# Patient Record
Sex: Female | Born: 1940
Health system: Southern US, Community
[De-identification: ages and names within clinical notes are randomized; demographics above are authoritative.]

## PROBLEM LIST (undated history)

## (undated) DIAGNOSIS — L989 Disorder of the skin and subcutaneous tissue, unspecified: Secondary | ICD-10-CM

## (undated) DIAGNOSIS — E079 Disorder of thyroid, unspecified: Secondary | ICD-10-CM

## (undated) DIAGNOSIS — E039 Hypothyroidism, unspecified: Secondary | ICD-10-CM

## (undated) HISTORY — PX: ELBOW SURGERY: SHX618

## (undated) HISTORY — PX: TUBAL LIGATION: SHX77

## (undated) HISTORY — PX: ANAL FISSURE REPAIR: SHX2312

## (undated) HISTORY — DX: Hypothyroidism, unspecified: E03.9

## (undated) HISTORY — PX: THYROID SURGERY: SHX805

## (undated) HISTORY — PX: TONSILLECTOMY: SUR1361

## (undated) HISTORY — PX: EYE SURGERY: SHX253

## (undated) HISTORY — PX: OTHER SURGICAL HISTORY: SHX169

## (undated) HISTORY — PX: FOOT SURGERY: SHX648

---
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
## 1997-09-19 ENCOUNTER — Other Ambulatory Visit: Admission: RE | Admit: 1997-09-19 | Discharge: 1997-09-19 | Payer: Self-pay | Admitting: Internal Medicine

## 1999-09-15 ENCOUNTER — Other Ambulatory Visit: Admission: RE | Admit: 1999-09-15 | Discharge: 1999-09-15 | Payer: Self-pay | Admitting: Internal Medicine

## 2000-09-20 ENCOUNTER — Other Ambulatory Visit: Admission: RE | Admit: 2000-09-20 | Discharge: 2000-09-20 | Payer: Self-pay | Admitting: Internal Medicine

## 2002-12-30 ENCOUNTER — Encounter: Payer: Self-pay | Admitting: Internal Medicine

## 2002-12-30 ENCOUNTER — Encounter: Admission: RE | Admit: 2002-12-30 | Discharge: 2002-12-30 | Payer: Self-pay | Admitting: Internal Medicine

## 2008-07-15 ENCOUNTER — Encounter: Admission: RE | Admit: 2008-07-15 | Discharge: 2008-07-15 | Payer: Self-pay | Admitting: Orthopedic Surgery

## 2008-07-24 ENCOUNTER — Encounter (INDEPENDENT_AMBULATORY_CARE_PROVIDER_SITE_OTHER): Payer: Self-pay | Admitting: *Deleted

## 2009-10-31 ENCOUNTER — Emergency Department (HOSPITAL_COMMUNITY): Admission: EM | Admit: 2009-10-31 | Discharge: 2009-10-31 | Payer: Self-pay | Admitting: Internal Medicine

## 2013-05-10 ENCOUNTER — Encounter: Payer: Self-pay | Admitting: Gastroenterology

## 2013-06-19 ENCOUNTER — Ambulatory Visit (AMBULATORY_SURGERY_CENTER): Payer: Self-pay

## 2013-06-19 VITALS — Ht 63.5 in | Wt 122.0 lb

## 2013-06-19 DIAGNOSIS — Z1211 Encounter for screening for malignant neoplasm of colon: Secondary | ICD-10-CM

## 2013-06-19 MED ORDER — SUPREP BOWEL PREP KIT 17.5-3.13-1.6 GM/177ML PO SOLN: 1.0000 | Freq: Once | ORAL | Status: DC

## 2013-07-03 ENCOUNTER — Ambulatory Visit (AMBULATORY_SURGERY_CENTER): Payer: Commercial Managed Care - HMO | Admitting: Gastroenterology

## 2013-07-03 ENCOUNTER — Encounter: Payer: Self-pay | Admitting: Gastroenterology

## 2013-07-03 VITALS — BP 127/73 | HR 50 | Temp 96.9°F | Resp 16 | Ht 63.5 in | Wt 122.0 lb

## 2013-07-03 DIAGNOSIS — K5730 Diverticulosis of large intestine without perforation or abscess without bleeding: Secondary | ICD-10-CM

## 2013-07-03 DIAGNOSIS — Z1211 Encounter for screening for malignant neoplasm of colon: Secondary | ICD-10-CM

## 2013-07-03 MED ORDER — SODIUM CHLORIDE 0.9 % IV SOLN: 500.0000 mL | INTRAVENOUS | Status: DC

## 2013-07-03 NOTE — Progress Notes (Signed)
Lidocaine-40mg IV prior to Propofol InductionPropofol given over incremental dosages 

## 2013-07-03 NOTE — Patient Instructions (Signed)
YOU HAD AN ENDOSCOPIC PROCEDURE TODAY AT THE [facility]: Refer to the procedure report that was given to you for any specific questions about what was found during the examination.  If the procedure report does not answer your questions, please call your gastroenterologist to clarify.  If you requested that your care partner not be given the details of your procedure findings, then the procedure report has been included in a sealed envelope for you to review at your convenience later.  YOU SHOULD EXPECT: Some feelings of bloating in the abdomen. Passage of more gas than usual.  Walking can help get rid of the air that was put into your GI tract during the procedure and reduce the bloating. If you had a lower endoscopy (such as a colonoscopy or flexible sigmoidoscopy) you may notice spotting of blood in your stool or on the toilet paper. If you underwent a bowel prep for your procedure, then you may not have a normal bowel movement for a few days.  DIET: Your first meal following the procedure should be a light meal and then it is ok to progress to your normal diet.  A half-sandwich or bowl of soup is an example of a good first meal.  Heavy or fried foods are harder to digest and may make you feel nauseous or bloated.  Likewise meals heavy in dairy and vegetables can cause extra gas to form and this can also increase the bloating.  Drink plenty of fluids but you should avoid alcoholic beverages for 24 hours.  ACTIVITY: Your care partner should take you home directly after the procedure.  You should plan to take it easy, moving slowly for the rest of the day.  You can resume normal activity the day after the procedure however you should NOT DRIVE or use heavy machinery for 24 hours (because of the sedation medicines used during the test).    SYMPTOMS TO REPORT IMMEDIATELY: A gastroenterologist can be reached at any hour.  During normal business hours, 8:30 AM to 5:00 PM Monday through Friday,  call [phone].  After hours and on weekends, please call the GI answering service at [phone] who will take a message and have the physician on call contact you.   Following lower endoscopy (colonoscopy or flexible sigmoidoscopy):  Excessive amounts of blood in the stool  Significant tenderness or worsening of abdominal pains  Swelling of the abdomen that is new, acute  Fever of 100F or higher  FOLLOW UP: If any biopsies were taken you will be contacted by phone or by letter within the next 1-3 weeks.  Call your gastroenterologist if you have not heard about the biopsies in 3 weeks.  Our staff will call the home number listed on your records the next business day following your procedure to check on you and address any questions or concerns that you may have at that time regarding the information given to you following your procedure. This is a courtesy call and so if there is no answer at the home number and we have not heard from you through the emergency physician on call, we will assume that you have returned to your regular daily activities without incident.  SIGNATURES/CONFIDENTIALITY: You and/or your care partner have signed paperwork which will be entered into your electronic medical record.  These signatures attest to the fact that that the information above on your After Visit Summary has been reviewed and is understood.  Full responsibility of the confidentiality of this   discharge information lies with you and/or your care-partner.  Diverticulosis, high fiber diet-handouts given  Repeat colonoscopy in 3 months, please call closer to June to make appointment, no schedule at this time for June.

## 2013-07-03 NOTE — Op Note (Signed)
[place]  [address] [place], [id number]   COLONOSCOPY PROCEDURE REPORT  PATIENT: Tammy, [name].  MR#: [id number] BIRTHDATE: 07-27-1940 , 72  yrs. old GENDER: Female ENDOSCOPIST: Inda Castle, MD REFERRED XH:FSFS Virgina Jock, M.D. PROCEDURE DATE:  07/03/2013 PROCEDURE:   Colonoscopy, diagnostic First Screening Colonoscopy - Avg.  risk and is 50 yrs.  old or older - No.  Prior Negative Screening - Now for repeat screening. 10 or more years since last screening  History of Adenoma - Now for follow-up colonoscopy & has been > or = to 3 yrs.  N/A ASA CLASS:   Class II INDICATIONS:Average risk patient for colon cancer. MEDICATIONS: MAC sedation, administered by CRNA and propofol (Diprivan) 500mg  IV  DESCRIPTION OF PROCEDURE:   After the risks benefits and alternatives of the procedure were thoroughly explained, informed consent was obtained.  A digital rectal exam revealed no abnormalities of the rectum.   The LB EL-[id number]  endoscope was introduced through the anus and advanced to the cecum, which was identified by both the appendix and ileocecal valve. No adverse events experienced.   The quality of the prep was Suprep good  The instrument was then slowly withdrawn as the colon was fully examined.      COLON FINDINGS: In the distal splenic flexure there was an area of prominent mucosa that was seen as the scope was pushed to the cecum.  Upon withdrawal of the scope this area could not be clearly identified.  The left colon and distal transverse colon were examined at least a dozen times over 30 minutes.  tthere is mild diverticulosis in the descending colon.   In the distal splenic flexure there was an area of prominent mucosa that was seen as the scope was pushed to the cecum.  Upon withdrawal of the scope this area could not be clearly identified.  The left colon and distal transverse colon were examined at least a dozen times over 30 minutes.   tthere is mild diverticulosis in the descending colon. The colon was otherwise normal.  There was no diverticulosis, inflammation, polyps or cancers unless previously stated. Retroflexed views revealed no abnormalities. The time to cecum=12 minutes 15 seconds.  Withdrawal time=45 minutes 0 seconds.  The scope was withdrawn and the procedure completed. COMPLICATIONS: There were no complications.  ENDOSCOPIC IMPRESSION: 1.   questionable sessile polyp versus prominent mucosa in the area of the spine flexure 2.  diverticulosis  RECOMMENDATIONS: repeat left colonoscopy in approximately 3 months  eSigned:  Inda Castle, MD 07/03/2013 9:57 AMre cc:   PATIENT NAME:  Tammy, [name]. MR#: [id number]

## 2013-07-03 NOTE — Progress Notes (Signed)
Called to room to assist during endoscopic procedure.  Patient ID and intended procedure confirmed with present staff. Received instructions for my participation in the procedure from the performing physician.  

## 2013-07-04 ENCOUNTER — Telehealth: Payer: Self-pay | Admitting: *Deleted

## 2013-07-04 NOTE — Telephone Encounter (Signed)
  Follow up Call-  Call back number 07/03/2013  Post procedure Call Back phone  # [phone]  Permission to leave phone message Yes    [facility]

## 2013-07-23 ENCOUNTER — Encounter: Payer: Self-pay | Admitting: Gastroenterology

## 2013-09-02 ENCOUNTER — Encounter: Payer: Self-pay | Admitting: Gastroenterology

## 2013-10-14 ENCOUNTER — Ambulatory Visit (AMBULATORY_SURGERY_CENTER): Payer: Self-pay | Admitting: *Deleted

## 2013-10-14 VITALS — Ht 63.5 in | Wt 123.2 lb

## 2013-10-14 DIAGNOSIS — Z1211 Encounter for screening for malignant neoplasm of colon: Secondary | ICD-10-CM

## 2013-10-14 MED ORDER — NA SULFATE-K SULFATE-MG SULF 17.5-3.13-1.6 GM/177ML PO SOLN: ORAL | Status: DC

## 2013-10-14 NOTE — Progress Notes (Signed)
Patient denies any allergies to eggs or soy. Patient denies any problems with anesthesia/sedation. Patient denies any oxygen use at home and does not take any diet/weight loss medications. Patient had emmi information from last colonoscopy.

## 2013-11-07 ENCOUNTER — Ambulatory Visit (AMBULATORY_SURGERY_CENTER): Payer: Commercial Managed Care - HMO | Admitting: Gastroenterology

## 2013-11-07 ENCOUNTER — Encounter: Payer: Self-pay | Admitting: Gastroenterology

## 2013-11-07 VITALS — BP 115/57 | HR 49 | Temp 96.6°F | Resp 11 | Ht 63.0 in | Wt 123.0 lb

## 2013-11-07 DIAGNOSIS — D126 Benign neoplasm of colon, unspecified: Secondary | ICD-10-CM

## 2013-11-07 DIAGNOSIS — Z1211 Encounter for screening for malignant neoplasm of colon: Secondary | ICD-10-CM

## 2013-11-07 MED ORDER — SODIUM CHLORIDE 0.9 % IV SOLN: 500.0000 mL | INTRAVENOUS | Status: DC

## 2013-11-07 NOTE — Op Note (Signed)
[place]  [address] [place], [id number]   COLONOSCOPY PROCEDURE REPORT  PATIENT: Tammy [name], Tammy [name].  MR#: [id number] BIRTHDATE: [birth year] , 72  yrs. old GENDER: Female ENDOSCOPIST: Inda Castle, MD REFERRED BY: PROCEDURE DATE:  11/07/2013 PROCEDURE:   Colonoscopy with snare polypectomy and Submucosal injection, any substance First Screening Colonoscopy - Avg.  risk and is 50 yrs.  old or older - No.  Prior Negative Screening - Now for repeat screening. N/A  History of Adenoma - Now for follow-up colonoscopy & has been > or = to 3 yrs.  No.  It has been less than 3 yrs since last colonoscopy.  Other: See Comments  Polyps Removed Today? Yes. ASA CLASS:   Class II INDICATIONS:Patient's personal history of colon polyp, seen on prior colonoscopy 3/15 but could not be reidentified for removal MEDICATIONS: MAC sedation, administered by CRNA and propofol (Diprivan) 500mg  IV  DESCRIPTION OF PROCEDURE:   After the risks benefits and alternatives of the procedure were thoroughly explained, informed consent was obtained.  A digital rectal exam revealed no abnormalities of the rectum.   The LB [id number]  endoscope was introduced through the anus and advanced to the cecum, which was identified by both the appendix and ileocecal valve. No adverse events experienced.   The quality of the prep was Suprep good  The instrument was then slowly withdrawn as the colon was fully examined.      COLON FINDINGS: A sessile polyp measuring 15 mm in size was found in the distal descending colon.  There was slight dimpling and rectal polyp.  A polypectomy was performed using snare cautery.  The resection was complete and the polyp tissue was completely retrieved.  A saline Injection was given to lift the mucosal wall. A tattoo was applied.  A tattoo was applied.  Injection (tattooing) was performed.   Mild diverticulosis was noted in the descending colon.   The colon  was otherwise normal.  There was no diverticulosis, inflammation, polyps or cancers unless previously stated.  Retroflexed views revealed no abnormalities. The time to cecum=14 minutes 63 seconds.  Withdrawal time=32 minutes 46 seconds.  The scope was withdrawn and the procedure completed. COMPLICATIONS: There were no complications.  ENDOSCOPIC IMPRESSION: 1.   Sessile polyp measuring 15 mm in size was found in the distal descending colon; polypectomy was performed using snare cautery; saline was given to lift the mucosal wall. Injection (tattooing) was performed 2.   Mild diverticulosis was noted in the descending colon 3.   The colon was otherwise normal  RECOMMENDATIONS: Await biopsy results   eSigned:  Inda Castle, MD 11/07/2013 9:27 AM   cc:   PATIENT NAME:  Indea, Dearman. MR#: [id number]

## 2013-11-07 NOTE — Progress Notes (Signed)
A/ox3, pleased with MAC, report to RN 

## 2013-11-07 NOTE — Patient Instructions (Signed)
YOU HAD AN ENDOSCOPIC PROCEDURE TODAY AT THE [facility]: Refer to the procedure report that was given to you for any specific questions about what was found during the examination.  If the procedure report does not answer your questions, please call your gastroenterologist to clarify.  If you requested that your care partner not be given the details of your procedure findings, then the procedure report has been included in a sealed envelope for you to review at your convenience later.  YOU SHOULD EXPECT: Some feelings of bloating in the abdomen. Passage of more gas than usual.  Walking can help get rid of the air that was put into your GI tract during the procedure and reduce the bloating. If you had a lower endoscopy (such as a colonoscopy or flexible sigmoidoscopy) you may notice spotting of blood in your stool or on the toilet paper. If you underwent a bowel prep for your procedure, then you may not have a normal bowel movement for a few days.  DIET: Your first meal following the procedure should be a light meal and then it is ok to progress to your normal diet.  A half-sandwich or bowl of soup is an example of a good first meal.  Heavy or fried foods are harder to digest and may make you feel nauseous or bloated.  Likewise meals heavy in dairy and vegetables can cause extra gas to form and this can also increase the bloating.  Drink plenty of fluids but you should avoid alcoholic beverages for 24 hours.  ACTIVITY: Your care partner should take you home directly after the procedure.  You should plan to take it easy, moving slowly for the rest of the day.  You can resume normal activity the day after the procedure however you should NOT DRIVE or use heavy machinery for 24 hours (because of the sedation medicines used during the test).    SYMPTOMS TO REPORT IMMEDIATELY: A gastroenterologist can be reached at any hour.  During normal business hours, 8:30 AM to 5:00 PM Monday through Friday,  call [phone].  After hours and on weekends, please call the GI answering service at [phone] who will take a message and have the physician on call contact you.   Following lower endoscopy (colonoscopy or flexible sigmoidoscopy):  Excessive amounts of blood in the stool  Significant tenderness or worsening of abdominal pains  Swelling of the abdomen that is new, acute  Fever of 100F or higher  FOLLOW UP: If any biopsies were taken you will be contacted by phone or by letter within the next 1-3 weeks.  Call your gastroenterologist if you have not heard about the biopsies in 3 weeks.  Our staff will call the home number listed on your records the next business day following your procedure to check on you and address any questions or concerns that you may have at that time regarding the information given to you following your procedure. This is a courtesy call and so if there is no answer at the home number and we have not heard from you through the emergency physician on call, we will assume that you have returned to your regular daily activities without incident.  SIGNATURES/CONFIDENTIALITY: You and/or your care partner have signed paperwork which will be entered into your electronic medical record.  These signatures attest to the fact that that the information above on your After Visit Summary has been reviewed and is understood.  Full responsibility of the confidentiality of this   discharge information lies with you and/or your care-partner.  Please read over handout about polyps, diverticulosis and high fiber diets  Continue your normal medications  Await pathology

## 2013-11-07 NOTE — Progress Notes (Signed)
Called to room to assist during endoscopic procedure.  Patient ID and intended procedure confirmed with present staff. Received instructions for my participation in the procedure from the performing physician.  

## 2013-11-08 ENCOUNTER — Telehealth: Payer: Self-pay | Admitting: *Deleted

## 2013-11-08 NOTE — Telephone Encounter (Signed)
  Follow up Call-  Call back number 11/07/2013 07/03/2013  Post procedure Call Back phone  # [phone]  Permission to leave phone message Yes Yes     Patient questions:  Do you have a fever, pain , or abdominal swelling? No. Pain Score  0 *  Have you tolerated food without any problems? Yes.    Have you been able to return to your normal activities? Yes.    Do you have any questions about your discharge instructions: Diet   No. Medications  No. Follow up visit  No.  Do you have questions or concerns about your Care? No.  Actions: * If pain score is 4 or above: No action needed, pain <4.

## 2013-11-13 ENCOUNTER — Encounter: Payer: Self-pay | Admitting: Gastroenterology

## 2015-01-08 DIAGNOSIS — H5203 Hypermetropia, bilateral: Secondary | ICD-10-CM | POA: Diagnosis not present

## 2015-01-08 DIAGNOSIS — Z0100 Encounter for examination of eyes and vision without abnormal findings: Secondary | ICD-10-CM | POA: Diagnosis not present

## 2015-01-08 DIAGNOSIS — H5210 Myopia, unspecified eye: Secondary | ICD-10-CM | POA: Diagnosis not present

## 2015-03-19 DIAGNOSIS — E871 Hypo-osmolality and hyponatremia: Secondary | ICD-10-CM | POA: Diagnosis not present

## 2015-03-19 DIAGNOSIS — E559 Vitamin D deficiency, unspecified: Secondary | ICD-10-CM | POA: Diagnosis not present

## 2015-03-19 DIAGNOSIS — E039 Hypothyroidism, unspecified: Secondary | ICD-10-CM | POA: Diagnosis not present

## 2015-04-01 DIAGNOSIS — Z1212 Encounter for screening for malignant neoplasm of rectum: Secondary | ICD-10-CM | POA: Diagnosis not present

## 2015-04-02 DIAGNOSIS — R739 Hyperglycemia, unspecified: Secondary | ICD-10-CM | POA: Diagnosis not present

## 2015-04-02 DIAGNOSIS — M810 Age-related osteoporosis without current pathological fracture: Secondary | ICD-10-CM | POA: Diagnosis not present

## 2015-04-02 DIAGNOSIS — E871 Hypo-osmolality and hyponatremia: Secondary | ICD-10-CM | POA: Diagnosis not present

## 2015-04-02 DIAGNOSIS — Z1389 Encounter for screening for other disorder: Secondary | ICD-10-CM | POA: Diagnosis not present

## 2015-04-02 DIAGNOSIS — E039 Hypothyroidism, unspecified: Secondary | ICD-10-CM | POA: Diagnosis not present

## 2015-04-02 DIAGNOSIS — Z6822 Body mass index (BMI) 22.0-22.9, adult: Secondary | ICD-10-CM | POA: Diagnosis not present

## 2015-04-02 DIAGNOSIS — Z0000 Encounter for general adult medical examination without abnormal findings: Secondary | ICD-10-CM | POA: Diagnosis not present

## 2015-05-06 DIAGNOSIS — Z803 Family history of malignant neoplasm of breast: Secondary | ICD-10-CM | POA: Diagnosis not present

## 2015-05-06 DIAGNOSIS — Z1231 Encounter for screening mammogram for malignant neoplasm of breast: Secondary | ICD-10-CM | POA: Diagnosis not present

## 2015-12-01 DIAGNOSIS — M810 Age-related osteoporosis without current pathological fracture: Secondary | ICD-10-CM | POA: Diagnosis not present

## 2015-12-01 DIAGNOSIS — E559 Vitamin D deficiency, unspecified: Secondary | ICD-10-CM | POA: Diagnosis not present

## 2016-03-28 DIAGNOSIS — R739 Hyperglycemia, unspecified: Secondary | ICD-10-CM | POA: Diagnosis not present

## 2016-03-28 DIAGNOSIS — Z0000 Encounter for general adult medical examination without abnormal findings: Secondary | ICD-10-CM | POA: Diagnosis not present

## 2016-03-28 DIAGNOSIS — E871 Hypo-osmolality and hyponatremia: Secondary | ICD-10-CM | POA: Diagnosis not present

## 2016-03-28 DIAGNOSIS — E038 Other specified hypothyroidism: Secondary | ICD-10-CM | POA: Diagnosis not present

## 2016-03-28 DIAGNOSIS — R7309 Other abnormal glucose: Secondary | ICD-10-CM | POA: Diagnosis not present

## 2016-03-28 DIAGNOSIS — E559 Vitamin D deficiency, unspecified: Secondary | ICD-10-CM | POA: Diagnosis not present

## 2016-03-28 DIAGNOSIS — M810 Age-related osteoporosis without current pathological fracture: Secondary | ICD-10-CM | POA: Diagnosis not present

## 2016-03-28 DIAGNOSIS — E039 Hypothyroidism, unspecified: Secondary | ICD-10-CM | POA: Diagnosis not present

## 2016-04-04 DIAGNOSIS — M810 Age-related osteoporosis without current pathological fracture: Secondary | ICD-10-CM | POA: Diagnosis not present

## 2016-04-04 DIAGNOSIS — K5790 Diverticulosis of intestine, part unspecified, without perforation or abscess without bleeding: Secondary | ICD-10-CM | POA: Diagnosis not present

## 2016-04-04 DIAGNOSIS — Z0000 Encounter for general adult medical examination without abnormal findings: Secondary | ICD-10-CM | POA: Diagnosis not present

## 2016-04-04 DIAGNOSIS — E559 Vitamin D deficiency, unspecified: Secondary | ICD-10-CM | POA: Diagnosis not present

## 2016-04-04 DIAGNOSIS — E038 Other specified hypothyroidism: Secondary | ICD-10-CM | POA: Diagnosis not present

## 2016-04-04 DIAGNOSIS — H9190 Unspecified hearing loss, unspecified ear: Secondary | ICD-10-CM | POA: Diagnosis not present

## 2016-04-04 DIAGNOSIS — R7309 Other abnormal glucose: Secondary | ICD-10-CM | POA: Diagnosis not present

## 2016-04-04 DIAGNOSIS — Z91030 Bee allergy status: Secondary | ICD-10-CM | POA: Diagnosis not present

## 2016-04-04 DIAGNOSIS — E871 Hypo-osmolality and hyponatremia: Secondary | ICD-10-CM | POA: Diagnosis not present

## 2016-04-14 DIAGNOSIS — Z1212 Encounter for screening for malignant neoplasm of rectum: Secondary | ICD-10-CM | POA: Diagnosis not present

## 2016-06-20 DIAGNOSIS — Z1231 Encounter for screening mammogram for malignant neoplasm of breast: Secondary | ICD-10-CM | POA: Diagnosis not present

## 2016-08-12 DIAGNOSIS — H2520 Age-related cataract, morgagnian type, unspecified eye: Secondary | ICD-10-CM | POA: Diagnosis not present

## 2016-08-12 DIAGNOSIS — H5202 Hypermetropia, left eye: Secondary | ICD-10-CM | POA: Diagnosis not present

## 2016-08-12 DIAGNOSIS — H25811 Combined forms of age-related cataract, right eye: Secondary | ICD-10-CM | POA: Diagnosis not present

## 2016-08-12 DIAGNOSIS — H52223 Regular astigmatism, bilateral: Secondary | ICD-10-CM | POA: Diagnosis not present

## 2016-09-27 ENCOUNTER — Encounter: Payer: Self-pay | Admitting: Gastroenterology

## 2017-03-30 DIAGNOSIS — E038 Other specified hypothyroidism: Secondary | ICD-10-CM | POA: Diagnosis not present

## 2017-03-30 DIAGNOSIS — E559 Vitamin D deficiency, unspecified: Secondary | ICD-10-CM | POA: Diagnosis not present

## 2017-03-30 DIAGNOSIS — R7309 Other abnormal glucose: Secondary | ICD-10-CM | POA: Diagnosis not present

## 2017-03-30 DIAGNOSIS — R82998 Other abnormal findings in urine: Secondary | ICD-10-CM | POA: Diagnosis not present

## 2017-04-06 DIAGNOSIS — Z0000 Encounter for general adult medical examination without abnormal findings: Secondary | ICD-10-CM | POA: Diagnosis not present

## 2017-04-06 DIAGNOSIS — R7309 Other abnormal glucose: Secondary | ICD-10-CM | POA: Diagnosis not present

## 2017-04-06 DIAGNOSIS — M810 Age-related osteoporosis without current pathological fracture: Secondary | ICD-10-CM | POA: Diagnosis not present

## 2017-04-06 DIAGNOSIS — D229 Melanocytic nevi, unspecified: Secondary | ICD-10-CM | POA: Diagnosis not present

## 2017-04-06 DIAGNOSIS — E038 Other specified hypothyroidism: Secondary | ICD-10-CM | POA: Diagnosis not present

## 2017-04-06 DIAGNOSIS — Z91030 Bee allergy status: Secondary | ICD-10-CM | POA: Diagnosis not present

## 2017-04-06 DIAGNOSIS — E559 Vitamin D deficiency, unspecified: Secondary | ICD-10-CM | POA: Diagnosis not present

## 2017-04-06 DIAGNOSIS — K5790 Diverticulosis of intestine, part unspecified, without perforation or abscess without bleeding: Secondary | ICD-10-CM | POA: Diagnosis not present

## 2017-04-06 DIAGNOSIS — E7849 Other hyperlipidemia: Secondary | ICD-10-CM | POA: Diagnosis not present

## 2017-04-12 DIAGNOSIS — Z1212 Encounter for screening for malignant neoplasm of rectum: Secondary | ICD-10-CM | POA: Diagnosis not present

## 2017-06-06 DIAGNOSIS — H25043 Posterior subcapsular polar age-related cataract, bilateral: Secondary | ICD-10-CM | POA: Diagnosis not present

## 2017-06-06 DIAGNOSIS — H02839 Dermatochalasis of unspecified eye, unspecified eyelid: Secondary | ICD-10-CM | POA: Diagnosis not present

## 2017-06-06 DIAGNOSIS — H2513 Age-related nuclear cataract, bilateral: Secondary | ICD-10-CM | POA: Diagnosis not present

## 2017-06-06 DIAGNOSIS — H2511 Age-related nuclear cataract, right eye: Secondary | ICD-10-CM | POA: Diagnosis not present

## 2017-06-06 DIAGNOSIS — H25013 Cortical age-related cataract, bilateral: Secondary | ICD-10-CM | POA: Diagnosis not present

## 2017-06-15 DIAGNOSIS — D485 Neoplasm of uncertain behavior of skin: Secondary | ICD-10-CM | POA: Diagnosis not present

## 2017-06-15 DIAGNOSIS — L989 Disorder of the skin and subcutaneous tissue, unspecified: Secondary | ICD-10-CM | POA: Diagnosis not present

## 2017-07-04 ENCOUNTER — Other Ambulatory Visit: Payer: Self-pay | Admitting: Surgery

## 2017-07-04 DIAGNOSIS — Q892 Congenital malformations of other endocrine glands: Secondary | ICD-10-CM | POA: Diagnosis not present

## 2017-07-05 DIAGNOSIS — Z1231 Encounter for screening mammogram for malignant neoplasm of breast: Secondary | ICD-10-CM | POA: Diagnosis not present

## 2017-07-07 ENCOUNTER — Other Ambulatory Visit: Payer: Self-pay | Admitting: Surgery

## 2017-07-07 DIAGNOSIS — Q892 Congenital malformations of other endocrine glands: Secondary | ICD-10-CM

## 2017-07-14 DIAGNOSIS — H2512 Age-related nuclear cataract, left eye: Secondary | ICD-10-CM | POA: Diagnosis not present

## 2017-07-14 DIAGNOSIS — H2511 Age-related nuclear cataract, right eye: Secondary | ICD-10-CM | POA: Diagnosis not present

## 2017-07-18 ENCOUNTER — Ambulatory Visit
Admission: RE | Admit: 2017-07-18 | Discharge: 2017-07-18 | Disposition: A | Discharge status: Home / Self Care | Payer: Medicare HMO | Source: Ambulatory Visit | Attending: Surgery | Admitting: Surgery

## 2017-07-18 DIAGNOSIS — Q892 Congenital malformations of other endocrine glands: Secondary | ICD-10-CM

## 2017-07-18 DIAGNOSIS — E041 Nontoxic single thyroid nodule: Secondary | ICD-10-CM | POA: Diagnosis not present

## 2017-07-28 DIAGNOSIS — H2512 Age-related nuclear cataract, left eye: Secondary | ICD-10-CM | POA: Diagnosis not present

## 2017-08-09 DIAGNOSIS — Z6822 Body mass index (BMI) 22.0-22.9, adult: Secondary | ICD-10-CM | POA: Diagnosis not present

## 2017-08-09 DIAGNOSIS — E890 Postprocedural hypothyroidism: Secondary | ICD-10-CM | POA: Diagnosis not present

## 2017-09-08 DIAGNOSIS — Z0100 Encounter for examination of eyes and vision without abnormal findings: Secondary | ICD-10-CM | POA: Diagnosis not present

## 2017-09-14 ENCOUNTER — Other Ambulatory Visit: Payer: Self-pay | Admitting: Surgery

## 2017-10-04 NOTE — Pre-Procedure Instructions (Addendum)
Tammy [name]  10/04/2017      [place], [place] [address] [place] [address] Phone: [phone] Fax: [phone]    Your procedure is scheduled on Wed., October 11, 2017 from 3:00PM-3:45PM  Report to [facility] Admitting Entrance "A" at 1:00PM  Call this number if you have problems the morning of surgery:  [phone]   Remember:  No food after midnight on June 11th  You may drink clear liquids until 3 hours (12:00PM) prior to surgery.  Clear liquids allowed are: Water, Juice (non-citric and without pulp), Carbonated beverages, Clear Tea, Black Coffee only, Plain Jell-O only, Gatorade and Plain Popsicles only   Please complete your PRE-SURGERY ENSURE that was given to by 12:00PM the day of surgery.  Please, if able, drink it in one setting. DO NOT SIP.    Take these medicines the morning of surgery with A SIP OF WATER: Levothyroxine (SYNTHROID, LEVOTHROID). If needed EPINEPHrine (EPIPEN) (Bring with you the day of surgery)  As of today, stop taking all Other Aspirin Products, Vitamins, Fish oils, and Herbal medications. Also stop all NSAIDS i.e. Advil, Ibuprofen, Motrin, Aleve, Anaprox, Naproxen, BC, Goody Powders, and all Supplements.    Do not wear jewelry, make-up or nail polish (fingers).  Do not wear lotions, powders, or perfumes, or deodorant.  Do not shave 48 hours prior to surgery.    Do not bring valuables to the hospital.  [facility] is not responsible for any belongings or valuables.  Contacts, dentures or bridgework may not be worn into surgery.  Leave your suitcase in the car.  After surgery it may be brought to your room.  For patients admitted to the hospital, discharge time will be determined by your treatment team.  Patients discharged the day of surgery will not be allowed to drive home.   Special instructions:   [place]- Preparing For Surgery  Before surgery, you can play  an important role. Because skin is not sterile, your skin needs to be as free of germs as possible. You can reduce the number of germs on your skin by washing with CHG (chlorahexidine gluconate) Soap before surgery.  CHG is an antiseptic cleaner which kills germs and bonds with the skin to continue killing germs even after washing.    Oral Hygiene is also important to reduce your risk of infection.  Remember - BRUSH YOUR TEETH THE MORNING OF SURGERY WITH YOUR REGULAR TOOTHPASTE  Please do not use if you have an allergy to CHG or antibacterial soaps. If your skin becomes reddened/irritated stop using the CHG.  Do not shave (including legs and underarms) for at least 48 hours prior to first CHG shower. It is OK to shave your face.  Please follow these instructions carefully.   1. Shower the NIGHT BEFORE SURGERY and the MORNING OF SURGERY with CHG.   2. If you chose to wash your hair, wash your hair first as usual with your normal shampoo.  3. After you shampoo, rinse your hair and body thoroughly to remove the shampoo.  4. Use CHG as you would any other liquid soap. You can apply CHG directly to the skin and wash gently with a scrungie or a clean washcloth.   5. Apply the CHG Soap to your body ONLY FROM THE NECK DOWN.  Do not use on open wounds or open sores. Avoid contact with your eyes, ears, mouth and genitals (private parts). Wash Face  and genitals (private parts)  with your normal soap.  6. Wash thoroughly, paying special attention to the area where your surgery will be performed.  7. Thoroughly rinse your body with warm water from the neck down.  8. DO NOT shower/wash with your normal soap after using and rinsing off the CHG Soap.  9. Pat yourself dry with a CLEAN TOWEL.  10. Wear CLEAN PAJAMAS to bed the night before surgery, wear comfortable clothes the morning of surgery  11. Place CLEAN SHEETS on your bed the night of your first shower and DO NOT SLEEP WITH PETS.  Day of  Surgery:  Do not apply any deodorants/lotions.  Please wear clean clothes to the hospital/surgery center.   Remember to brush your teeth WITH YOUR REGULAR TOOTHPASTE.  Please read over the following fact sheets that you were given. Pain Booklet, Coughing and Deep Breathing and Surgical Site Infection Prevention

## 2017-10-05 ENCOUNTER — Ambulatory Visit (HOSPITAL_COMMUNITY)
Admission: RE | Admit: 2017-10-05 | Discharge: 2017-10-05 | Disposition: A | Discharge status: Home / Self Care | Payer: Medicare HMO | Source: Ambulatory Visit | Attending: Surgery | Admitting: Surgery

## 2017-10-05 ENCOUNTER — Encounter (HOSPITAL_COMMUNITY)
Admission: RE | Admit: 2017-10-05 | Discharge: 2017-10-05 | Disposition: A | Discharge status: Home / Self Care | Payer: Medicare HMO | Source: Ambulatory Visit | Attending: Surgery | Admitting: Surgery

## 2017-10-05 ENCOUNTER — Other Ambulatory Visit: Payer: Self-pay

## 2017-10-05 ENCOUNTER — Encounter (HOSPITAL_COMMUNITY): Payer: Self-pay

## 2017-10-05 DIAGNOSIS — Z01818 Encounter for other preprocedural examination: Secondary | ICD-10-CM | POA: Insufficient documentation

## 2017-10-05 DIAGNOSIS — Z01812 Encounter for preprocedural laboratory examination: Secondary | ICD-10-CM | POA: Insufficient documentation

## 2017-10-05 HISTORY — DX: Disorder of the skin and subcutaneous tissue, unspecified: L98.9

## 2017-10-05 LAB — BASIC METABOLIC PANEL
ANION GAP: 8 (ref 5–15)
BUN: 13 mg/dL (ref 6–20)
CO2: 25 mmol/L (ref 22–32)
Calcium: 9.7 mg/dL (ref 8.9–10.3)
Chloride: 101 mmol/L (ref 101–111)
Creatinine, Ser: 0.6 mg/dL (ref 0.44–1.00)
Glucose, Bld: 56 mg/dL — ABNORMAL LOW (ref 65–99)
POTASSIUM: 3.9 mmol/L (ref 3.5–5.1)
SODIUM: 134 mmol/L — AB (ref 135–145)

## 2017-10-05 LAB — CBC
HCT: 39.4 % (ref 36.0–46.0)
HEMOGLOBIN: 12.9 g/dL (ref 12.0–15.0)
MCH: 31 pg (ref 26.0–34.0)
MCHC: 32.7 g/dL (ref 30.0–36.0)
MCV: 94.7 fL (ref 78.0–100.0)
Platelets: 322 10*3/uL (ref 150–400)
RBC: 4.16 MIL/uL (ref 3.87–5.11)
RDW: 13.2 % (ref 11.5–15.5)
WBC: 5.3 10*3/uL (ref 4.0–10.5)

## 2017-10-05 NOTE — Progress Notes (Signed)
PCP - Dr. Shon Baton- [facility]  Cardiologist - Denies  Chest x-ray - 10/05/17  EKG - Denies  Stress Test - Denies  ECHO - Denies  Cardiac Cath - Denies  Sleep Study - Denies CPAP - None  LABS- 10/05/17: CBC, BMP  ASA- Denies   Anesthesia- No  Pt denies having chest pain, sob, or fever at this time. All instructions explained to the pt, with a verbal understanding of the material. Pt agrees to go over the instructions while at home for a better understanding. The opportunity to ask questions was provided.

## 2017-10-10 NOTE — H&P (Signed)
Tammy [name]  Location: [facility] Patient #: [id number] DOB: 09/03/1940 Married / Language: English / Race: White Female   History of Present Illness Tammy [name] A. Tammy Linden MD; The patient is a 77 year old female who presents for evaluation of a thyroid disorder. This is a pleasant patient referred by Dr. Harriett Sine for evaluation of an abnormal skin lesion on her neck. She was sent to dermatology for this abnormal lesion which she said has been present for more than 10 years. She underwent a shave biopsy and the final pathology showed abnormal thyroid cells. She has had a previous left thyroid lobectomy by her report around 25 years ago. This was for atypical cells seen on a fine-needle aspiration. She still has the right lobe of thyroid. She has had no problems. She thought that the original skin lesion was a stitch abscess and picked at it often. It would occasionally bleed. She has no pain and is otherwise without complaints. She denies difficulty swallowing. She has had no weight loss or weight gain.   Past Surgical History Tammy [name], CMA;  Anal Fissure Repair  Breast Biopsy  Left. Foot Surgery  Left. Thyroid Surgery  Tonsillectomy   Diagnostic Studies History Tammy [name], CMA;  Colonoscopy  1-5 years ago Mammogram  1-3 years ago Pap Smear  1-5 years ago  Allergies Tammy [name], CMA; No Known Drug Allergies [07/04/2017]:  Medication History Tammy [name], CMA;  Synthroid (125MCG Tablet, Oral) Active. EpiPen 2-Pak (0.3MG /0.3ML Soln Auto-inj, Injection) Active. Medications Reconciled  Pregnancy / Birth History Tammy [name], CMA;  Age at menarche  28 years. Age of menopause  [age] Contraceptive History  Oral contraceptives. Gravida  1 Length (months) of breastfeeding  3-6 Maternal age  67-25 Para  14  Other Problems Tammy [name], CMA;  Thyroid Disease     Review of Systems Tammy [name] CMA;  General Not  Present- Appetite Loss, Chills, Fatigue, Fever, Night Sweats, Weight Gain and Weight Loss. Skin Present- Change in Wart/Mole. Not Present- Dryness, Hives, Jaundice, New Lesions, Non-Healing Wounds, Rash and Ulcer. HEENT Present- Wears glasses/contact lenses. Not Present- Earache, Hearing Loss, Hoarseness, Nose Bleed, Oral Ulcers, Ringing in the Ears, Seasonal Allergies, Sinus Pain, Sore Throat, Visual Disturbances and Yellow Eyes. Respiratory Not Present- Bloody sputum, Chronic Cough, Difficulty Breathing, Snoring and Wheezing. Breast Not Present- Breast Mass, Breast Pain, Nipple Discharge and Skin Changes. Cardiovascular Not Present- Chest Pain, Difficulty Breathing Lying Down, Leg Cramps, Palpitations, Rapid Heart Rate, Shortness of Breath and Swelling of Extremities. Gastrointestinal Not Present- Abdominal Pain, Bloating, Bloody Stool, Change in Bowel Habits, Chronic diarrhea, Constipation, Difficulty Swallowing, Excessive gas, Gets full quickly at meals, Hemorrhoids, Indigestion, Nausea, Rectal Pain and Vomiting. Female Genitourinary Not Present- Frequency, Nocturia, Painful Urination, Pelvic Pain and Urgency. Neurological Not Present- Decreased Memory, Fainting, Headaches, Numbness, Seizures, Tingling, Tremor, Trouble walking and Weakness. Psychiatric Not Present- Anxiety, Bipolar, Change in Sleep Pattern, Depression, Fearful and Frequent crying. Endocrine Not Present- Cold Intolerance, Excessive Hunger, Hair Changes, Heat Intolerance, Hot flashes and New Diabetes. Hematology Present- Gland problems. Not Present- Blood Thinners, Easy Bruising, Excessive bleeding, HIV and Persistent Infections.  Vitals   Weight: 133.2 lb Height: 63.5in Body Surface Area: 1.64 m Body Mass Index: 23.22 kg/m  Temp.: 97.38F(Oral)  BP: 110/78 (Sitting, Left Arm, Standard)     Physical Exam (Tammy [name] A. Tammy Linden MD;  The physical exam findings are as follows: Note:On examination of her neck, just  above her old scar is a small round raised skin lesion  which is less than a centimeter in size. It is mobile. There is no enlargement of the right lobe of thyroid and I feel no adenopathy in her neck. Lungs clear CV RRR Abdomen soft, NT/ND    Assessment & Plan    THYROID, ECTOPIC (Q89.2)  Ultrasound confirmed that her left thyroid lobe is still present.  Given that the skin lesion is getting larger and occasionally bleeds and is consistent with thyroid tissue, excision is recommend. I discussed the risks which include, but are not limited to bleeding, infection, injury to surrounding structures, recurrence, etc.  She agrees to proceed

## 2017-10-11 ENCOUNTER — Ambulatory Visit (HOSPITAL_COMMUNITY): Payer: Medicare HMO | Admitting: Anesthesiology

## 2017-10-11 ENCOUNTER — Encounter (HOSPITAL_COMMUNITY)
Admission: RE | Disposition: A | Discharge status: Home / Self Care | Payer: Self-pay | Source: Ambulatory Visit | Attending: Surgery

## 2017-10-11 ENCOUNTER — Ambulatory Visit (HOSPITAL_COMMUNITY)
Admission: RE | Admit: 2017-10-11 | Discharge: 2017-10-11 | Disposition: A | Discharge status: Home / Self Care | Payer: Medicare HMO | Source: Ambulatory Visit | Attending: Surgery | Admitting: Surgery

## 2017-10-11 ENCOUNTER — Encounter (HOSPITAL_COMMUNITY): Payer: Self-pay

## 2017-10-11 DIAGNOSIS — C792 Secondary malignant neoplasm of skin: Secondary | ICD-10-CM | POA: Insufficient documentation

## 2017-10-11 DIAGNOSIS — C73 Malignant neoplasm of thyroid gland: Secondary | ICD-10-CM | POA: Insufficient documentation

## 2017-10-11 DIAGNOSIS — Q892 Congenital malformations of other endocrine glands: Secondary | ICD-10-CM | POA: Insufficient documentation

## 2017-10-11 DIAGNOSIS — E039 Hypothyroidism, unspecified: Secondary | ICD-10-CM | POA: Diagnosis not present

## 2017-10-11 DIAGNOSIS — E038 Other specified hypothyroidism: Secondary | ICD-10-CM | POA: Diagnosis not present

## 2017-10-11 DIAGNOSIS — C4449 Other specified malignant neoplasm of skin of scalp and neck: Secondary | ICD-10-CM | POA: Diagnosis not present

## 2017-10-11 DIAGNOSIS — Z87891 Personal history of nicotine dependence: Secondary | ICD-10-CM | POA: Diagnosis not present

## 2017-10-11 DIAGNOSIS — L989 Disorder of the skin and subcutaneous tissue, unspecified: Secondary | ICD-10-CM | POA: Diagnosis not present

## 2017-10-11 DIAGNOSIS — Z79899 Other long term (current) drug therapy: Secondary | ICD-10-CM | POA: Insufficient documentation

## 2017-10-11 HISTORY — PX: LESION REMOVAL: SHX5196

## 2017-10-11 SURGERY — WIDE EXCISION, LESION, UPPER EXTREMITY
Anesthesia: Monitor Anesthesia Care

## 2017-10-11 MED ORDER — GABAPENTIN 300 MG PO CAPS
300.0000 mg | ORAL_CAPSULE | ORAL | Status: AC
  Administered 2017-10-11: 300 mg via ORAL
  Filled 2017-10-11: qty 1

## 2017-10-11 MED ORDER — MIDAZOLAM HCL 2 MG/2ML IJ SOLN
INTRAMUSCULAR | Status: AC
Start: 2017-10-11 — End: ?
  Filled 2017-10-11: qty 2

## 2017-10-11 MED ORDER — FENTANYL CITRATE (PF) 250 MCG/5ML IJ SOLN
INTRAMUSCULAR | Status: DC | PRN
  Administered 2017-10-11: 50 ug via INTRAVENOUS

## 2017-10-11 MED ORDER — ACETAMINOPHEN 500 MG PO TABS
1000.0000 mg | ORAL_TABLET | ORAL | Status: AC
  Administered 2017-10-11: 1000 mg via ORAL
  Filled 2017-10-11: qty 2

## 2017-10-11 MED ORDER — LIDOCAINE HCL (PF) 1 % IJ SOLN
INTRAMUSCULAR | Status: AC
  Filled 2017-10-11: qty 30

## 2017-10-11 MED ORDER — TRAMADOL HCL 50 MG PO TABS: 50.0000 mg | ORAL_TABLET | Freq: Four times a day (QID) | ORAL | 0 refills | Status: DC | PRN

## 2017-10-11 MED ORDER — BUPIVACAINE-EPINEPHRINE 0.25% -1:200000 IJ SOLN
INTRAMUSCULAR | Status: DC | PRN
  Administered 2017-10-11: 4 mL

## 2017-10-11 MED ORDER — CHLORHEXIDINE GLUCONATE CLOTH 2 % EX PADS: 6.0000 | MEDICATED_PAD | Freq: Once | CUTANEOUS | Status: DC

## 2017-10-11 MED ORDER — PROMETHAZINE HCL 25 MG/ML IJ SOLN: 6.2500 mg | INTRAMUSCULAR | Status: DC | PRN

## 2017-10-11 MED ORDER — PROPOFOL 500 MG/50ML IV EMUL
INTRAVENOUS | Status: DC | PRN
  Administered 2017-10-11: 75 ug/kg/min via INTRAVENOUS

## 2017-10-11 MED ORDER — PROPOFOL 1000 MG/100ML IV EMUL
INTRAVENOUS | Status: AC
  Filled 2017-10-11: qty 100

## 2017-10-11 MED ORDER — MEPERIDINE HCL 50 MG/ML IJ SOLN: 6.2500 mg | INTRAMUSCULAR | Status: DC | PRN

## 2017-10-11 MED ORDER — PROPOFOL 10 MG/ML IV BOLUS
INTRAVENOUS | Status: AC
  Filled 2017-10-11: qty 20

## 2017-10-11 MED ORDER — MIDAZOLAM HCL 2 MG/2ML IJ SOLN: 0.5000 mg | Freq: Once | INTRAMUSCULAR | Status: DC | PRN

## 2017-10-11 MED ORDER — LACTATED RINGERS IV SOLN
INTRAVENOUS | Status: DC
  Administered 2017-10-11 (×2): via INTRAVENOUS

## 2017-10-11 MED ORDER — LIDOCAINE HCL (PF) 1 % IJ SOLN
INTRAMUSCULAR | Status: DC | PRN
  Administered 2017-10-11: 4 mL

## 2017-10-11 MED ORDER — MIDAZOLAM HCL 2 MG/2ML IJ SOLN
INTRAMUSCULAR | Status: DC | PRN
  Administered 2017-10-11: 2 mg via INTRAVENOUS

## 2017-10-11 MED ORDER — CEFAZOLIN SODIUM-DEXTROSE 2-4 GM/100ML-% IV SOLN
2.0000 g | INTRAVENOUS | Status: AC
  Administered 2017-10-11: 2 g via INTRAVENOUS
  Filled 2017-10-11: qty 100

## 2017-10-11 MED ORDER — BUPIVACAINE-EPINEPHRINE (PF) 0.25% -1:200000 IJ SOLN
INTRAMUSCULAR | Status: AC
  Filled 2017-10-11: qty 30

## 2017-10-11 MED ORDER — FENTANYL CITRATE (PF) 250 MCG/5ML IJ SOLN
INTRAMUSCULAR | Status: AC
  Filled 2017-10-11: qty 5

## 2017-10-11 MED ORDER — FENTANYL CITRATE (PF) 100 MCG/2ML IJ SOLN: 25.0000 ug | INTRAMUSCULAR | Status: DC | PRN

## 2017-10-11 SURGICAL SUPPLY — 38 items
ADH SKN CLS APL DERMABOND .7 (GAUZE/BANDAGES/DRESSINGS) ×1
CANISTER SUCT 3000ML PPV (MISCELLANEOUS) IMPLANT
COVER SURGICAL LIGHT HANDLE (MISCELLANEOUS) ×3 IMPLANT
DECANTER SPIKE VIAL GLASS SM (MISCELLANEOUS) ×3 IMPLANT
DERMABOND ADVANCED (GAUZE/BANDAGES/DRESSINGS) ×2
DERMABOND ADVANCED .7 DNX12 (GAUZE/BANDAGES/DRESSINGS) ×1 IMPLANT
DRAPE LAPAROSCOPIC ABDOMINAL (DRAPES) IMPLANT
DRAPE LAPAROTOMY 100X72 PEDS (DRAPES) IMPLANT
DRAPE UTILITY XL STRL (DRAPES) ×6 IMPLANT
ELECT CAUTERY BLADE 6.4 (BLADE) ×3 IMPLANT
ELECT REM PT RETURN 9FT ADLT (ELECTROSURGICAL) ×3
ELECTRODE REM PT RTRN 9FT ADLT (ELECTROSURGICAL) ×1 IMPLANT
GAUZE SPONGE 4X4 12PLY STRL (GAUZE/BANDAGES/DRESSINGS) IMPLANT
GLOVE SURG SIGNA 7.5 PF LTX (GLOVE) ×3 IMPLANT
GOWN STRL REUS W/ TWL LRG LVL3 (GOWN DISPOSABLE) ×1 IMPLANT
GOWN STRL REUS W/ TWL XL LVL3 (GOWN DISPOSABLE) ×1 IMPLANT
GOWN STRL REUS W/TWL LRG LVL3 (GOWN DISPOSABLE) ×3
GOWN STRL REUS W/TWL XL LVL3 (GOWN DISPOSABLE) ×3
KIT BASIN OR (CUSTOM PROCEDURE TRAY) ×3 IMPLANT
KIT TURNOVER KIT B (KITS) ×3 IMPLANT
NDL HYPO 25GX1X1/2 BEV (NEEDLE) ×1 IMPLANT
NEEDLE HYPO 25GX1X1/2 BEV (NEEDLE) ×3 IMPLANT
NS IRRIG 1000ML POUR BTL (IV SOLUTION) ×3 IMPLANT
PACK SURGICAL SETUP 50X90 (CUSTOM PROCEDURE TRAY) ×3 IMPLANT
PAD ARMBOARD 7.5X6 YLW CONV (MISCELLANEOUS) ×6 IMPLANT
PENCIL BUTTON HOLSTER BLD 10FT (ELECTRODE) ×3 IMPLANT
SPECIMEN JAR SMALL (MISCELLANEOUS) ×3 IMPLANT
SPONGE LAP 18X18 X RAY DECT (DISPOSABLE) ×3 IMPLANT
SUT MNCRL AB 4-0 PS2 18 (SUTURE) ×3 IMPLANT
SUT VIC AB 3-0 SH 27 (SUTURE) ×3
SUT VIC AB 3-0 SH 27XBRD (SUTURE) ×1 IMPLANT
SYR BULB 3OZ (MISCELLANEOUS) ×3 IMPLANT
SYR CONTROL 10ML LL (SYRINGE) ×3 IMPLANT
TOWEL OR 17X24 6PK STRL BLUE (TOWEL DISPOSABLE) ×3 IMPLANT
TOWEL OR 17X26 10 PK STRL BLUE (TOWEL DISPOSABLE) ×3 IMPLANT
TUBE CONNECTING 12'X1/4 (SUCTIONS)
TUBE CONNECTING 12X1/4 (SUCTIONS) IMPLANT
YANKAUER SUCT BULB TIP NO VENT (SUCTIONS) IMPLANT

## 2017-10-11 NOTE — Anesthesia Preprocedure Evaluation (Addendum)
Anesthesia Evaluation  Patient identified by MRN, date of birth, ID band Patient awake    Reviewed: Allergy & Precautions, NPO status , Patient's Chart, lab work & pertinent test results  Airway Mallampati: II  TM Distance: >3 FB Neck ROM: Full    Dental  (+) Teeth Intact, Dental Advisory Given   Pulmonary former smoker,    breath sounds clear to auscultation       Cardiovascular  Rhythm:Regular Rate:Normal     Neuro/Psych    GI/Hepatic   Endo/Other  Hypothyroidism   Renal/GU      Musculoskeletal   Abdominal   Peds  Hematology   Anesthesia Other Findings   Reproductive/Obstetrics                            Anesthesia Physical Anesthesia Plan  ASA: II  Anesthesia Plan: MAC   Post-op Pain Management:    Induction: Intravenous  PONV Risk Score and Plan: 3 and Treatment may vary due to age or medical condition  Airway Management Planned: Natural Airway and Simple Face Mask  Additional Equipment:   Intra-op Plan:   Post-operative Plan:   Informed Consent: I have reviewed the patients History and Physical, chart, labs and discussed the procedure including the risks, benefits and alternatives for the proposed anesthesia with the patient or authorized representative who has indicated his/her understanding and acceptance.   Dental advisory given  Plan Discussed with: CRNA and Anesthesiologist  Anesthesia Plan Comments:       Anesthesia Quick Evaluation

## 2017-10-11 NOTE — Interval H&P Note (Signed)
History and Physical Interval Note: no change in H and P  10/11/2017 1:46 PM  Tammy [name]  has presented today for surgery, with the diagnosis of anterior neck skin lesion  The various methods of treatment have been discussed with the patient and family. After consideration of risks, benefits and other options for treatment, the patient has consented to  Procedure(s): [facility] (N/A) as a surgical intervention .  The patient's history has been reviewed, patient examined, no change in status, stable for surgery.  I have reviewed the patient's chart and labs.  Questions were answered to the patient's satisfaction.     [name] A

## 2017-10-11 NOTE — Discharge Instructions (Signed)
Ok to shower starting tomorrow ° °Ice pack, tylenol, ibuprofen also for pain °

## 2017-10-11 NOTE — Op Note (Signed)
EXCISION ANTERIOR NECK SKIN LESION ERAS PATHWAY  Procedure Note  Tammy [name] 10/11/2017   Pre-op Diagnosis: anterior neck skin lesion     Post-op Diagnosis: same  Procedure(s): EXCISION ANTERIOR NECK SKIN LESION (2 cm) ERAS PATHWAY  Surgeon(s): Coralie Keens, MD  Anesthesia: General  Staff:  Circulator: Campbell Riches, RN Scrub Person: Murvin Natal  Estimated Blood Loss: Minimal               Specimens: sent to path  Indications: This is [name] 77 year old female who is status post [name] previous thyroid lobectomy many years ago.  She developed an abnormal skin lesion on her neck several years ago.  Biopsy shows it to be consistent with thyroid tissue.  The decision was made to proceed with excision  Procedure: The patient was brought to the operating room and identified correct patient.  She is placed upon the operating room table and anesthesia was induced.  Her neck was prepped and draped in the usual sterile fashion.  I grasped the skin lesion with an Allis clamp and then excised with an elliptical incision with [name] scalpel.  The specimen was then sent to pathology for evaluation.  I achieved hemostasis with the cautery.  I then closed the subtenons tissue with interrupted 3-0 Vicryl sutures and closed the skin with [name] running 4-0 Monocryl.  Dermabond was then applied.  The patient tolerated procedure well.  All the counts were correct at the end of procedure.  The patient was then taken in [name] stable condition to the recovery room.          Tammy [name]   Date: 10/11/2017  Time: 4:44 PM

## 2017-10-11 NOTE — Transfer of Care (Signed)
Immediate Anesthesia Transfer of Care Note  Patient: Tammy [name]  Procedure(s) Performed: EXCISION ANTERIOR NECK SKIN LESION ERAS PATHWAY (N/A )  Patient Location: PACU  Anesthesia Type:MAC  Level of Consciousness: awake, alert , oriented and patient cooperative  Airway & Oxygen Therapy: Patient Spontanous Breathing and Patient connected to nasal cannula oxygen  Post-op Assessment: Report given to RN, Post -op Vital signs reviewed and stable and Patient moving all extremities  Post vital signs: Reviewed and stable  Last Vitals:  Vitals Value Taken Time  BP    Temp    Pulse 53 10/11/2017  4:49 PM  Resp 13 10/11/2017  4:49 PM  SpO2 100 % 10/11/2017  4:49 PM  Vitals shown include unvalidated device data.  Last Pain:  Vitals:   10/11/17 1256  TempSrc:   PainSc: 0-No pain         Complications: No apparent anesthesia complications

## 2017-10-12 ENCOUNTER — Encounter (HOSPITAL_COMMUNITY): Payer: Self-pay | Admitting: Surgery

## 2017-10-12 NOTE — Anesthesia Postprocedure Evaluation (Signed)
Anesthesia Post Note  Patient: Tammy [name]  Procedure(s) Performed: EXCISION ANTERIOR NECK SKIN LESION ERAS PATHWAY (N/A )     Patient location during evaluation: PACU Anesthesia Type: MAC Level of consciousness: awake and alert Pain management: pain level controlled Vital Signs Assessment: post-procedure vital signs reviewed and stable Respiratory status: spontaneous breathing, nonlabored ventilation and respiratory function stable Cardiovascular status: stable and blood pressure returned to baseline Postop Assessment: no apparent nausea or vomiting Anesthetic complications: no Comments: MAC procedure under 15 minutes in duration. Patient with no complaints of nausea/vomiting.    Last Vitals:  Vitals:   10/11/17 1718 10/11/17 1728  BP: (!) 107/58 122/63  Pulse: (!) 47 (!) 46  Resp: 11 10  Temp: 36.6 C   SpO2: 99% 100%    Last Pain:  Vitals:   10/11/17 1728  TempSrc:   PainSc: 0-No pain                 Catalina Gravel

## 2017-10-12 NOTE — Addendum Note (Signed)
Addendum  created 10/12/17 1403 by Catalina Gravel, MD   Sign clinical note, SmartForm saved

## 2017-10-19 ENCOUNTER — Other Ambulatory Visit (HOSPITAL_COMMUNITY): Payer: Self-pay | Admitting: Endocrinology

## 2017-10-19 DIAGNOSIS — C73 Malignant neoplasm of thyroid gland: Secondary | ICD-10-CM

## 2017-10-20 ENCOUNTER — Other Ambulatory Visit: Payer: Self-pay | Admitting: Surgery

## 2017-10-23 ENCOUNTER — Encounter (HOSPITAL_COMMUNITY)
Admission: RE | Admit: 2017-10-23 | Discharge: 2017-10-23 | Disposition: A | Discharge status: Home / Self Care | Payer: Medicare HMO | Source: Ambulatory Visit | Attending: Endocrinology | Admitting: Endocrinology

## 2017-10-23 DIAGNOSIS — C73 Malignant neoplasm of thyroid gland: Secondary | ICD-10-CM | POA: Insufficient documentation

## 2017-10-23 MED ORDER — THYROTROPIN ALFA 1.1 MG IM SOLR
0.9000 mg | INTRAMUSCULAR | Status: AC
  Administered 2017-10-23: 0.9 mg via INTRAMUSCULAR

## 2017-10-23 MED ORDER — THYROTROPIN ALFA 1.1 MG IM SOLR
INTRAMUSCULAR | Status: AC
  Filled 2017-10-23: qty 0.9

## 2017-10-24 ENCOUNTER — Encounter (HOSPITAL_COMMUNITY)
Admission: RE | Admit: 2017-10-24 | Discharge: 2017-10-24 | Disposition: A | Discharge status: Home / Self Care | Payer: Medicare HMO | Source: Ambulatory Visit | Attending: Endocrinology | Admitting: Endocrinology

## 2017-10-24 DIAGNOSIS — C73 Malignant neoplasm of thyroid gland: Secondary | ICD-10-CM | POA: Diagnosis not present

## 2017-10-24 MED ORDER — THYROTROPIN ALFA 1.1 MG IM SOLR
0.9000 mg | INTRAMUSCULAR | Status: AC
  Administered 2017-10-24: 0.9 mg via INTRAMUSCULAR

## 2017-10-24 MED ORDER — THYROTROPIN ALFA 1.1 MG IM SOLR
INTRAMUSCULAR | Status: AC
  Filled 2017-10-24: qty 0.9

## 2017-10-25 ENCOUNTER — Encounter (HOSPITAL_COMMUNITY)
Admission: RE | Admit: 2017-10-25 | Discharge: 2017-10-25 | Disposition: A | Discharge status: Home / Self Care | Payer: Medicare HMO | Source: Ambulatory Visit | Attending: Endocrinology | Admitting: Endocrinology

## 2017-10-25 MED ORDER — SODIUM IODIDE I 131 CAPSULE: 4.0000 | Freq: Once | INTRAVENOUS | Status: DC | PRN

## 2017-10-27 ENCOUNTER — Encounter (HOSPITAL_COMMUNITY)
Admission: RE | Admit: 2017-10-27 | Discharge: 2017-10-27 | Disposition: A | Discharge status: Home / Self Care | Payer: Medicare HMO | Source: Ambulatory Visit | Attending: Endocrinology | Admitting: Endocrinology

## 2017-10-27 DIAGNOSIS — C73 Malignant neoplasm of thyroid gland: Secondary | ICD-10-CM | POA: Diagnosis not present

## 2017-11-08 ENCOUNTER — Other Ambulatory Visit: Payer: Self-pay | Admitting: Surgery

## 2017-12-07 DIAGNOSIS — C73 Malignant neoplasm of thyroid gland: Secondary | ICD-10-CM | POA: Diagnosis not present

## 2017-12-07 DIAGNOSIS — Z6823 Body mass index (BMI) 23.0-23.9, adult: Secondary | ICD-10-CM | POA: Diagnosis not present

## 2017-12-07 DIAGNOSIS — E890 Postprocedural hypothyroidism: Secondary | ICD-10-CM | POA: Diagnosis not present

## 2017-12-07 DIAGNOSIS — M810 Age-related osteoporosis without current pathological fracture: Secondary | ICD-10-CM | POA: Diagnosis not present

## 2017-12-07 DIAGNOSIS — E559 Vitamin D deficiency, unspecified: Secondary | ICD-10-CM | POA: Diagnosis not present

## 2017-12-07 DIAGNOSIS — E871 Hypo-osmolality and hyponatremia: Secondary | ICD-10-CM | POA: Diagnosis not present

## 2018-03-27 DIAGNOSIS — Z961 Presence of intraocular lens: Secondary | ICD-10-CM | POA: Diagnosis not present

## 2018-03-27 DIAGNOSIS — H18413 Arcus senilis, bilateral: Secondary | ICD-10-CM | POA: Diagnosis not present

## 2018-03-27 DIAGNOSIS — H02839 Dermatochalasis of unspecified eye, unspecified eyelid: Secondary | ICD-10-CM | POA: Diagnosis not present

## 2018-03-27 DIAGNOSIS — H40013 Open angle with borderline findings, low risk, bilateral: Secondary | ICD-10-CM | POA: Diagnosis not present

## 2018-04-09 DIAGNOSIS — E7849 Other hyperlipidemia: Secondary | ICD-10-CM | POA: Diagnosis not present

## 2018-04-09 DIAGNOSIS — R82998 Other abnormal findings in urine: Secondary | ICD-10-CM | POA: Diagnosis not present

## 2018-04-09 DIAGNOSIS — C73 Malignant neoplasm of thyroid gland: Secondary | ICD-10-CM | POA: Diagnosis not present

## 2018-04-09 DIAGNOSIS — E038 Other specified hypothyroidism: Secondary | ICD-10-CM | POA: Diagnosis not present

## 2018-04-09 DIAGNOSIS — R7309 Other abnormal glucose: Secondary | ICD-10-CM | POA: Diagnosis not present

## 2018-04-09 DIAGNOSIS — M810 Age-related osteoporosis without current pathological fracture: Secondary | ICD-10-CM | POA: Diagnosis not present

## 2018-04-16 DIAGNOSIS — E890 Postprocedural hypothyroidism: Secondary | ICD-10-CM | POA: Diagnosis not present

## 2018-04-16 DIAGNOSIS — E871 Hypo-osmolality and hyponatremia: Secondary | ICD-10-CM | POA: Diagnosis not present

## 2018-04-16 DIAGNOSIS — E7849 Other hyperlipidemia: Secondary | ICD-10-CM | POA: Diagnosis not present

## 2018-04-16 DIAGNOSIS — R7309 Other abnormal glucose: Secondary | ICD-10-CM | POA: Diagnosis not present

## 2018-04-16 DIAGNOSIS — Z0000 Encounter for general adult medical examination without abnormal findings: Secondary | ICD-10-CM | POA: Diagnosis not present

## 2018-04-16 DIAGNOSIS — M810 Age-related osteoporosis without current pathological fracture: Secondary | ICD-10-CM | POA: Diagnosis not present

## 2018-04-16 DIAGNOSIS — C73 Malignant neoplasm of thyroid gland: Secondary | ICD-10-CM | POA: Diagnosis not present

## 2018-04-16 DIAGNOSIS — E038 Other specified hypothyroidism: Secondary | ICD-10-CM | POA: Diagnosis not present

## 2018-04-16 DIAGNOSIS — D229 Melanocytic nevi, unspecified: Secondary | ICD-10-CM | POA: Diagnosis not present

## 2018-04-17 DIAGNOSIS — Z1212 Encounter for screening for malignant neoplasm of rectum: Secondary | ICD-10-CM | POA: Diagnosis not present

## 2018-06-11 DIAGNOSIS — E890 Postprocedural hypothyroidism: Secondary | ICD-10-CM | POA: Diagnosis not present

## 2018-06-11 DIAGNOSIS — Z6823 Body mass index (BMI) 23.0-23.9, adult: Secondary | ICD-10-CM | POA: Diagnosis not present

## 2018-06-11 DIAGNOSIS — E871 Hypo-osmolality and hyponatremia: Secondary | ICD-10-CM | POA: Diagnosis not present

## 2018-06-11 DIAGNOSIS — C73 Malignant neoplasm of thyroid gland: Secondary | ICD-10-CM | POA: Diagnosis not present

## 2018-10-29 DIAGNOSIS — E871 Hypo-osmolality and hyponatremia: Secondary | ICD-10-CM | POA: Diagnosis not present

## 2018-10-29 DIAGNOSIS — E785 Hyperlipidemia, unspecified: Secondary | ICD-10-CM | POA: Diagnosis not present

## 2018-10-29 DIAGNOSIS — Z1339 Encounter for screening examination for other mental health and behavioral disorders: Secondary | ICD-10-CM | POA: Diagnosis not present

## 2018-10-29 DIAGNOSIS — E890 Postprocedural hypothyroidism: Secondary | ICD-10-CM | POA: Diagnosis not present

## 2018-10-29 DIAGNOSIS — Z1331 Encounter for screening for depression: Secondary | ICD-10-CM | POA: Diagnosis not present

## 2018-10-29 DIAGNOSIS — E039 Hypothyroidism, unspecified: Secondary | ICD-10-CM | POA: Diagnosis not present

## 2018-10-29 DIAGNOSIS — F418 Other specified anxiety disorders: Secondary | ICD-10-CM | POA: Diagnosis not present

## 2018-10-29 DIAGNOSIS — M810 Age-related osteoporosis without current pathological fracture: Secondary | ICD-10-CM | POA: Diagnosis not present

## 2018-10-29 DIAGNOSIS — C73 Malignant neoplasm of thyroid gland: Secondary | ICD-10-CM | POA: Diagnosis not present

## 2018-11-05 DIAGNOSIS — C73 Malignant neoplasm of thyroid gland: Secondary | ICD-10-CM | POA: Diagnosis not present

## 2018-11-05 DIAGNOSIS — H40013 Open angle with borderline findings, low risk, bilateral: Secondary | ICD-10-CM | POA: Diagnosis not present

## 2018-12-10 DIAGNOSIS — E871 Hypo-osmolality and hyponatremia: Secondary | ICD-10-CM | POA: Diagnosis not present

## 2018-12-10 DIAGNOSIS — M810 Age-related osteoporosis without current pathological fracture: Secondary | ICD-10-CM | POA: Diagnosis not present

## 2018-12-10 DIAGNOSIS — E559 Vitamin D deficiency, unspecified: Secondary | ICD-10-CM | POA: Diagnosis not present

## 2018-12-10 DIAGNOSIS — E890 Postprocedural hypothyroidism: Secondary | ICD-10-CM | POA: Diagnosis not present

## 2018-12-10 DIAGNOSIS — C73 Malignant neoplasm of thyroid gland: Secondary | ICD-10-CM | POA: Diagnosis not present

## 2018-12-10 DIAGNOSIS — E785 Hyperlipidemia, unspecified: Secondary | ICD-10-CM | POA: Diagnosis not present

## 2019-03-21 DIAGNOSIS — Z0100 Encounter for examination of eyes and vision without abnormal findings: Secondary | ICD-10-CM | POA: Diagnosis not present

## 2019-03-21 DIAGNOSIS — H5203 Hypermetropia, bilateral: Secondary | ICD-10-CM | POA: Diagnosis not present

## 2019-04-11 IMAGING — CR DG CHEST 2V
2 series · 2 of 2 positions shown · non-contrast
Comparison: None.

CLINICAL DATA: Preoperative evaluation for thyroidectomy

EXAM:
CHEST - 2 VIEW

[w chest pa]
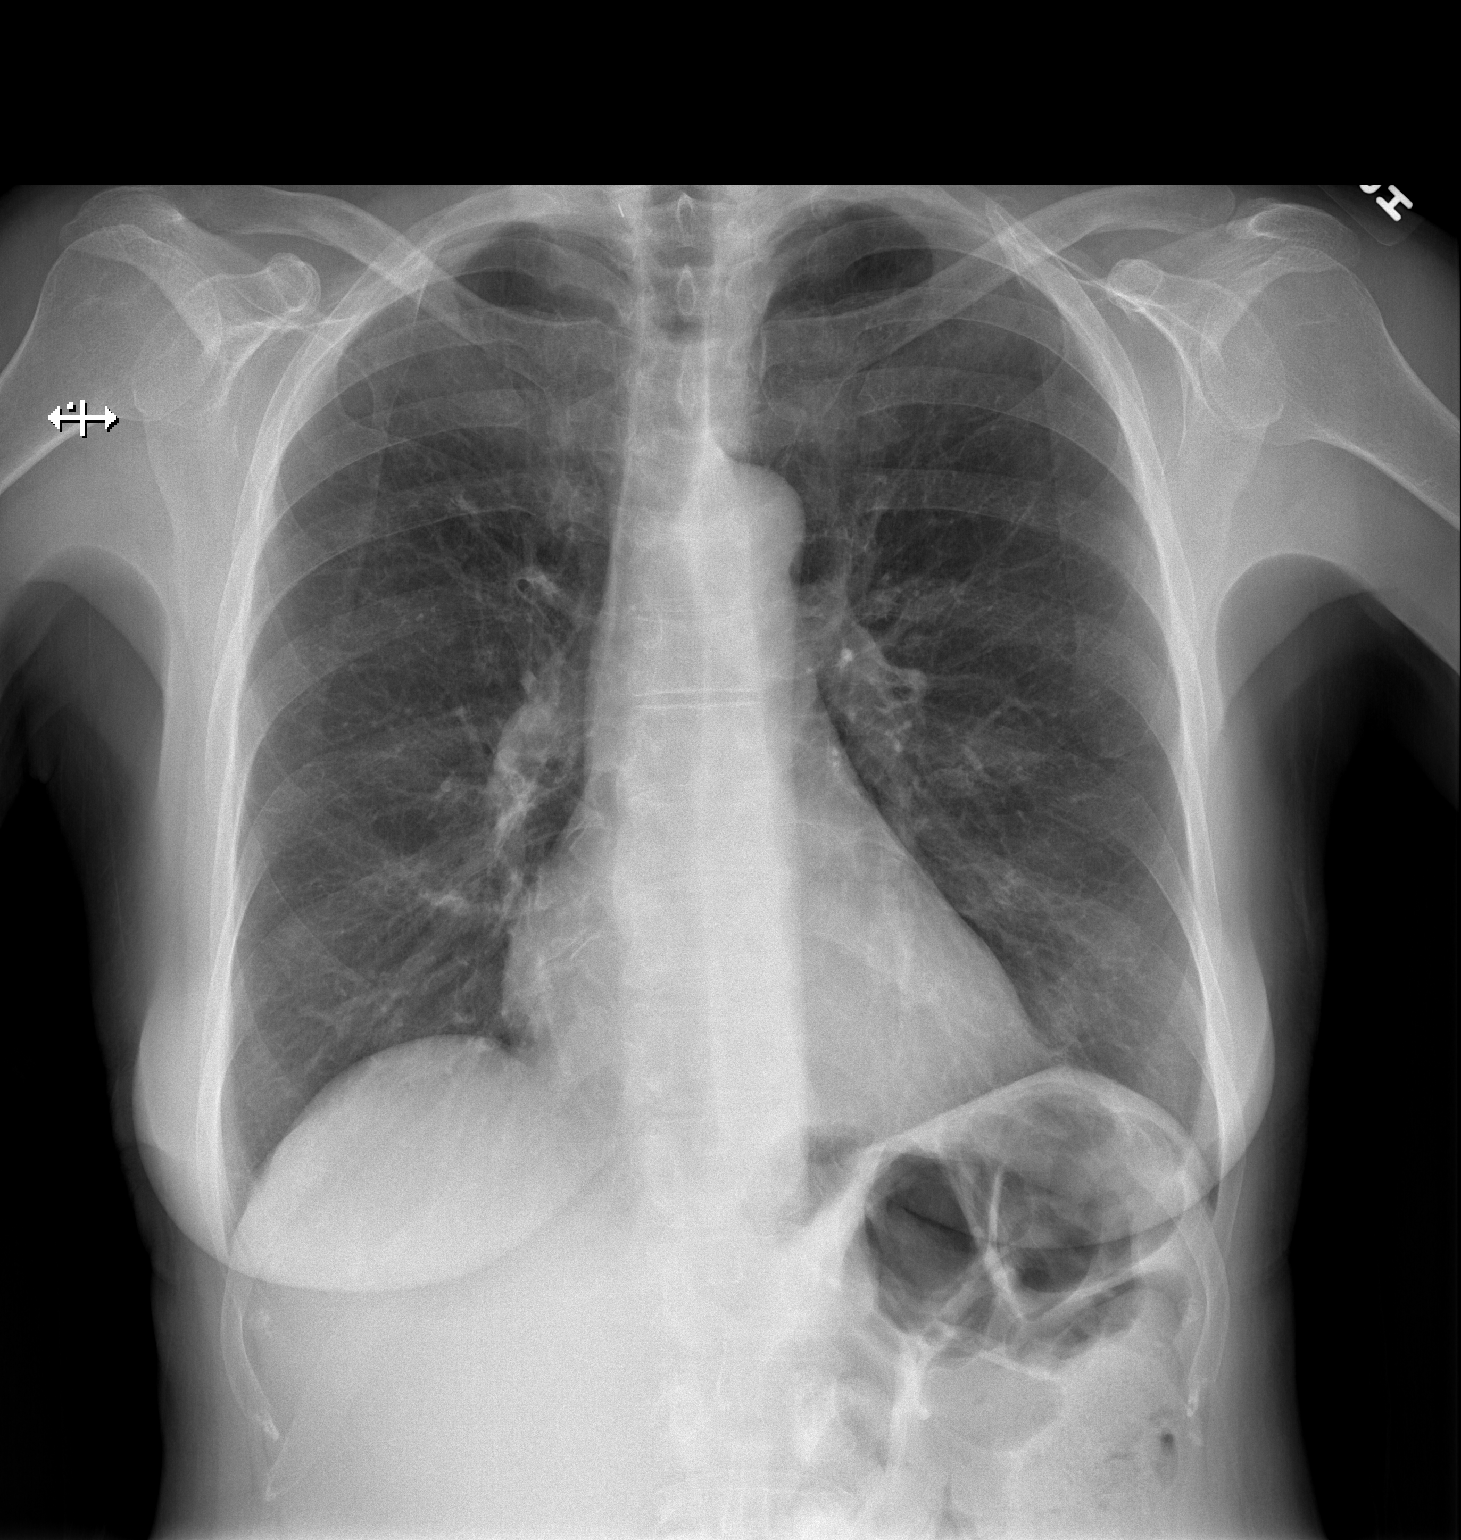

[w chest lat]
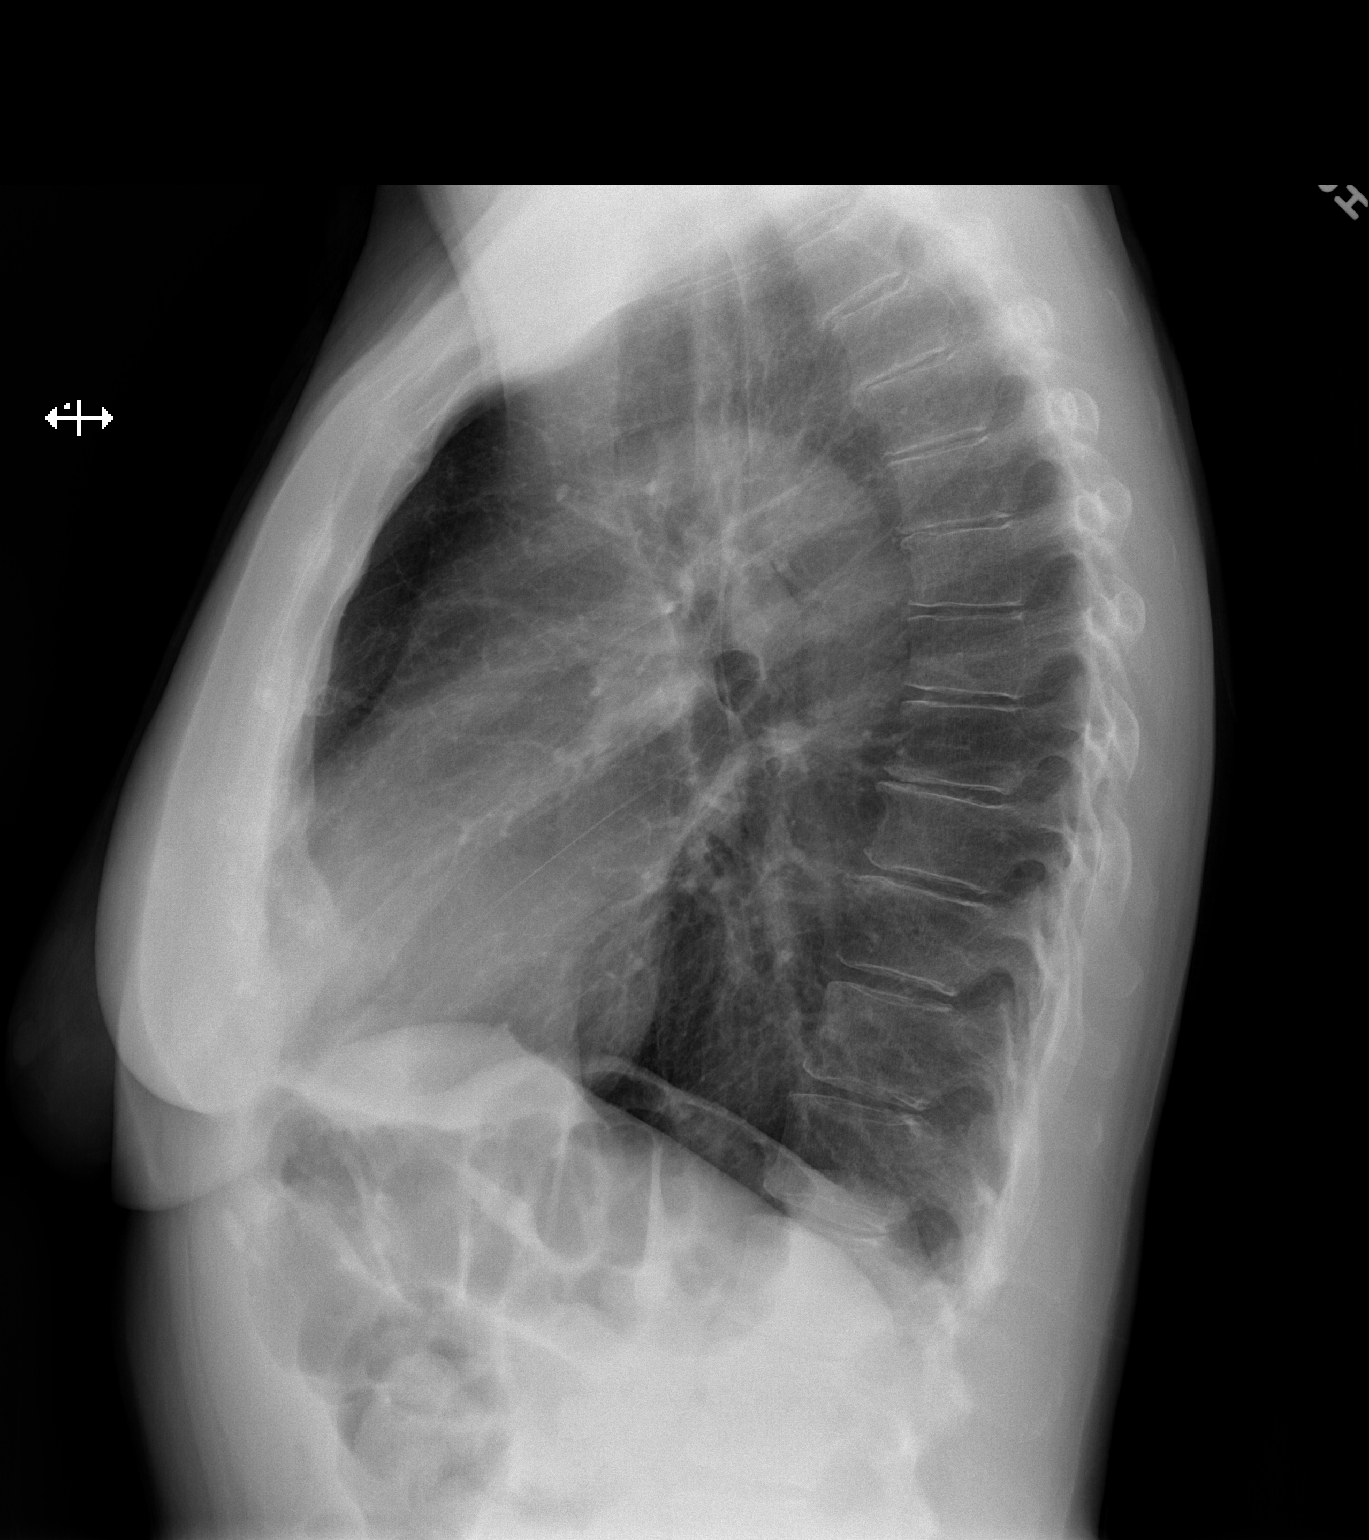

[2 of 2 positions shown; findings below may reference images not displayed]

FINDINGS: Lungs are clear. Heart size and pulmonary vascularity are normal. No
adenopathy. Trachea is in the midline. No bone lesions.
IMPRESSION: No edema or consolidation.  Trachea appears midline.

## 2019-04-22 DIAGNOSIS — E038 Other specified hypothyroidism: Secondary | ICD-10-CM | POA: Diagnosis not present

## 2019-04-22 DIAGNOSIS — E7849 Other hyperlipidemia: Secondary | ICD-10-CM | POA: Diagnosis not present

## 2019-04-22 DIAGNOSIS — M810 Age-related osteoporosis without current pathological fracture: Secondary | ICD-10-CM | POA: Diagnosis not present

## 2019-05-02 DIAGNOSIS — L853 Xerosis cutis: Secondary | ICD-10-CM | POA: Diagnosis not present

## 2019-05-02 DIAGNOSIS — E890 Postprocedural hypothyroidism: Secondary | ICD-10-CM | POA: Diagnosis not present

## 2019-05-02 DIAGNOSIS — E039 Hypothyroidism, unspecified: Secondary | ICD-10-CM | POA: Diagnosis not present

## 2019-05-02 DIAGNOSIS — E781 Pure hyperglyceridemia: Secondary | ICD-10-CM | POA: Diagnosis not present

## 2019-05-02 DIAGNOSIS — Z0000 Encounter for general adult medical examination without abnormal findings: Secondary | ICD-10-CM | POA: Diagnosis not present

## 2019-05-02 DIAGNOSIS — E559 Vitamin D deficiency, unspecified: Secondary | ICD-10-CM | POA: Diagnosis not present

## 2019-05-02 DIAGNOSIS — F418 Other specified anxiety disorders: Secondary | ICD-10-CM | POA: Diagnosis not present

## 2019-05-02 DIAGNOSIS — C73 Malignant neoplasm of thyroid gland: Secondary | ICD-10-CM | POA: Diagnosis not present

## 2019-05-02 DIAGNOSIS — E785 Hyperlipidemia, unspecified: Secondary | ICD-10-CM | POA: Diagnosis not present

## 2019-09-17 DIAGNOSIS — Z20828 Contact with and (suspected) exposure to other viral communicable diseases: Secondary | ICD-10-CM | POA: Diagnosis not present

## 2019-12-13 DIAGNOSIS — R739 Hyperglycemia, unspecified: Secondary | ICD-10-CM | POA: Diagnosis not present

## 2019-12-13 DIAGNOSIS — M810 Age-related osteoporosis without current pathological fracture: Secondary | ICD-10-CM | POA: Diagnosis not present

## 2019-12-13 DIAGNOSIS — E039 Hypothyroidism, unspecified: Secondary | ICD-10-CM | POA: Diagnosis not present

## 2019-12-13 DIAGNOSIS — F418 Other specified anxiety disorders: Secondary | ICD-10-CM | POA: Diagnosis not present

## 2019-12-13 DIAGNOSIS — C73 Malignant neoplasm of thyroid gland: Secondary | ICD-10-CM | POA: Diagnosis not present

## 2019-12-13 DIAGNOSIS — E871 Hypo-osmolality and hyponatremia: Secondary | ICD-10-CM | POA: Diagnosis not present

## 2019-12-13 DIAGNOSIS — E890 Postprocedural hypothyroidism: Secondary | ICD-10-CM | POA: Diagnosis not present

## 2019-12-13 DIAGNOSIS — E785 Hyperlipidemia, unspecified: Secondary | ICD-10-CM | POA: Diagnosis not present

## 2019-12-13 DIAGNOSIS — E559 Vitamin D deficiency, unspecified: Secondary | ICD-10-CM | POA: Diagnosis not present

## 2020-01-01 DIAGNOSIS — E785 Hyperlipidemia, unspecified: Secondary | ICD-10-CM | POA: Diagnosis not present

## 2020-03-31 ENCOUNTER — Observation Stay (HOSPITAL_COMMUNITY)
Admission: EM | Admit: 2020-03-31 | Discharge: 2020-04-01 | Disposition: A | Discharge status: Home / Self Care | Payer: Medicare HMO | Attending: Physician Assistant | Admitting: Physician Assistant

## 2020-03-31 ENCOUNTER — Encounter (HOSPITAL_COMMUNITY): Payer: Self-pay

## 2020-03-31 ENCOUNTER — Emergency Department (HOSPITAL_COMMUNITY): Payer: Medicare HMO

## 2020-03-31 DIAGNOSIS — Z79899 Other long term (current) drug therapy: Secondary | ICD-10-CM | POA: Insufficient documentation

## 2020-03-31 DIAGNOSIS — Q433 Congenital malformations of intestinal fixation: Secondary | ICD-10-CM | POA: Insufficient documentation

## 2020-03-31 DIAGNOSIS — Z043 Encounter for examination and observation following other accident: Secondary | ICD-10-CM | POA: Diagnosis not present

## 2020-03-31 DIAGNOSIS — W19XXXA Unspecified fall, initial encounter: Secondary | ICD-10-CM | POA: Diagnosis not present

## 2020-03-31 DIAGNOSIS — R410 Disorientation, unspecified: Secondary | ICD-10-CM | POA: Insufficient documentation

## 2020-03-31 DIAGNOSIS — I700 Atherosclerosis of aorta: Secondary | ICD-10-CM | POA: Diagnosis not present

## 2020-03-31 DIAGNOSIS — T1490XA Injury, unspecified, initial encounter: Secondary | ICD-10-CM

## 2020-03-31 DIAGNOSIS — S060X1A Concussion with loss of consciousness of 30 minutes or less, initial encounter: Secondary | ICD-10-CM | POA: Diagnosis not present

## 2020-03-31 DIAGNOSIS — S060XAA Concussion with loss of consciousness status unknown, initial encounter: Secondary | ICD-10-CM | POA: Diagnosis present

## 2020-03-31 DIAGNOSIS — R2681 Unsteadiness on feet: Secondary | ICD-10-CM | POA: Diagnosis not present

## 2020-03-31 DIAGNOSIS — R41841 Cognitive communication deficit: Secondary | ICD-10-CM | POA: Insufficient documentation

## 2020-03-31 DIAGNOSIS — S06360A Traumatic hemorrhage of cerebrum, unspecified, without loss of consciousness, initial encounter: Secondary | ICD-10-CM | POA: Diagnosis not present

## 2020-03-31 DIAGNOSIS — R9431 Abnormal electrocardiogram [ECG] [EKG]: Secondary | ICD-10-CM | POA: Diagnosis not present

## 2020-03-31 DIAGNOSIS — S299XXA Unspecified injury of thorax, initial encounter: Secondary | ICD-10-CM | POA: Diagnosis not present

## 2020-03-31 DIAGNOSIS — S060X0A Concussion without loss of consciousness, initial encounter: Secondary | ICD-10-CM | POA: Diagnosis not present

## 2020-03-31 DIAGNOSIS — Z20822 Contact with and (suspected) exposure to covid-19: Secondary | ICD-10-CM | POA: Insufficient documentation

## 2020-03-31 DIAGNOSIS — R110 Nausea: Secondary | ICD-10-CM | POA: Diagnosis not present

## 2020-03-31 DIAGNOSIS — R4182 Altered mental status, unspecified: Secondary | ICD-10-CM | POA: Diagnosis not present

## 2020-03-31 DIAGNOSIS — Y92481 Parking lot as the place of occurrence of the external cause: Secondary | ICD-10-CM | POA: Diagnosis not present

## 2020-03-31 DIAGNOSIS — S0990XA Unspecified injury of head, initial encounter: Secondary | ICD-10-CM | POA: Diagnosis not present

## 2020-03-31 DIAGNOSIS — Z87891 Personal history of nicotine dependence: Secondary | ICD-10-CM | POA: Insufficient documentation

## 2020-03-31 DIAGNOSIS — R2689 Other abnormalities of gait and mobility: Secondary | ICD-10-CM | POA: Insufficient documentation

## 2020-03-31 DIAGNOSIS — S0003XA Contusion of scalp, initial encounter: Secondary | ICD-10-CM | POA: Diagnosis not present

## 2020-03-31 DIAGNOSIS — K389 Disease of appendix, unspecified: Secondary | ICD-10-CM | POA: Insufficient documentation

## 2020-03-31 DIAGNOSIS — R404 Transient alteration of awareness: Secondary | ICD-10-CM | POA: Diagnosis not present

## 2020-03-31 DIAGNOSIS — S3991XA Unspecified injury of abdomen, initial encounter: Secondary | ICD-10-CM | POA: Diagnosis not present

## 2020-03-31 DIAGNOSIS — S060X9A Concussion with loss of consciousness of unspecified duration, initial encounter: Secondary | ICD-10-CM | POA: Diagnosis present

## 2020-03-31 HISTORY — DX: Disorder of thyroid, unspecified: E07.9

## 2020-03-31 LAB — URINALYSIS, ROUTINE W REFLEX MICROSCOPIC
Bilirubin Urine: NEGATIVE
Glucose, UA: NEGATIVE mg/dL
Hgb urine dipstick: NEGATIVE
Ketones, ur: NEGATIVE mg/dL
Leukocytes,Ua: NEGATIVE
Nitrite: NEGATIVE
Protein, ur: NEGATIVE mg/dL
Specific Gravity, Urine: 1.025 (ref 1.005–1.030)
pH: 6 (ref 5.0–8.0)

## 2020-03-31 LAB — I-STAT CHEM 8, ED
BUN: 18 mg/dL (ref 8–23)
Calcium, Ion: 1.09 mmol/L — ABNORMAL LOW (ref 1.15–1.40)
Chloride: 99 mmol/L (ref 98–111)
Creatinine, Ser: 0.5 mg/dL (ref 0.44–1.00)
Glucose, Bld: 119 mg/dL — ABNORMAL HIGH (ref 70–99)
HCT: 37 % (ref 36.0–46.0)
Hemoglobin: 12.6 g/dL (ref 12.0–15.0)
Potassium: 3.5 mmol/L (ref 3.5–5.1)
Sodium: 131 mmol/L — ABNORMAL LOW (ref 135–145)
TCO2: 21 mmol/L — ABNORMAL LOW (ref 22–32)

## 2020-03-31 LAB — CBC
HCT: 37 % (ref 36.0–46.0)
Hemoglobin: 12 g/dL (ref 12.0–15.0)
MCH: 30.6 pg (ref 26.0–34.0)
MCHC: 32.4 g/dL (ref 30.0–36.0)
MCV: 94.4 fL (ref 80.0–100.0)
Platelets: 297 10*3/uL (ref 150–400)
RBC: 3.92 MIL/uL (ref 3.87–5.11)
RDW: 12.7 % (ref 11.5–15.5)
WBC: 7.8 10*3/uL (ref 4.0–10.5)
nRBC: 0 % (ref 0.0–0.2)

## 2020-03-31 LAB — SAMPLE TO BLOOD BANK

## 2020-03-31 LAB — COMPREHENSIVE METABOLIC PANEL
ALT: 20 U/L (ref 0–44)
AST: 18 U/L (ref 15–41)
Albumin: 3.9 g/dL (ref 3.5–5.0)
Alkaline Phosphatase: 66 U/L (ref 38–126)
Anion gap: 11 (ref 5–15)
BUN: 17 mg/dL (ref 8–23)
CO2: 20 mmol/L — ABNORMAL LOW (ref 22–32)
Calcium: 9.1 mg/dL (ref 8.9–10.3)
Chloride: 99 mmol/L (ref 98–111)
Creatinine, Ser: 0.63 mg/dL (ref 0.44–1.00)
GFR, Estimated: >60 mL/min (ref >60)
Glucose, Bld: 123 mg/dL — ABNORMAL HIGH (ref 70–99)
Potassium: 3.6 mmol/L (ref 3.5–5.1)
Sodium: 130 mmol/L — ABNORMAL LOW (ref 135–145)
Total Bilirubin: 0.5 mg/dL (ref 0.3–1.2)
Total Protein: 6.4 g/dL — ABNORMAL LOW (ref 6.5–8.1)

## 2020-03-31 LAB — RESP PANEL BY RT-PCR (FLU A&B, COVID) ARPGX2
Influenza A by PCR: NEGATIVE
Influenza B by PCR: NEGATIVE
SARS Coronavirus 2 by RT PCR: NEGATIVE

## 2020-03-31 LAB — PROTIME-INR
INR: 1 (ref 0.8–1.2)
Prothrombin Time: 12.8 seconds (ref 11.4–15.2)

## 2020-03-31 LAB — LACTIC ACID, PLASMA: Lactic Acid, Venous: 0.9 mmol/L (ref 0.5–1.9)

## 2020-03-31 MED ORDER — ACETAMINOPHEN 325 MG PO TABS
650.0000 mg | ORAL_TABLET | ORAL | Status: DC | PRN
  Administered 2020-04-01: 650 mg via ORAL
  Filled 2020-03-31: qty 2

## 2020-03-31 MED ORDER — PROCHLORPERAZINE EDISYLATE 10 MG/2ML IJ SOLN: 5.0000 mg | Freq: Four times a day (QID) | INTRAMUSCULAR | Status: DC | PRN

## 2020-03-31 MED ORDER — MORPHINE SULFATE (PF) 2 MG/ML IV SOLN: 2.0000 mg | INTRAVENOUS | Status: DC | PRN

## 2020-03-31 MED ORDER — LEVOTHYROXINE SODIUM 25 MCG PO TABS
125.0000 ug | ORAL_TABLET | Freq: Every day | ORAL | Status: DC
  Administered 2020-04-01: 125 ug via ORAL
  Filled 2020-03-31: qty 1

## 2020-03-31 MED ORDER — PROMETHAZINE HCL 25 MG/ML IJ SOLN
12.5000 mg | Freq: Once | INTRAMUSCULAR | Status: AC
  Administered 2020-03-31: 12.5 mg via INTRAVENOUS
  Filled 2020-03-31: qty 1

## 2020-03-31 MED ORDER — ENOXAPARIN SODIUM 30 MG/0.3ML ~~LOC~~ SOLN
30.0000 mg | Freq: Two times a day (BID) | SUBCUTANEOUS | Status: DC
  Administered 2020-04-01: 30 mg via SUBCUTANEOUS
  Filled 2020-03-31: qty 0.3

## 2020-03-31 MED ORDER — SODIUM CHLORIDE 0.9 % IV SOLN: INTRAVENOUS | Status: DC

## 2020-03-31 MED ORDER — ONDANSETRON HCL 4 MG/2ML IJ SOLN
4.0000 mg | Freq: Four times a day (QID) | INTRAMUSCULAR | Status: DC | PRN
  Administered 2020-03-31: 4 mg via INTRAVENOUS
  Filled 2020-03-31: qty 2

## 2020-03-31 MED ORDER — OXYCODONE HCL 5 MG PO TABS: 5.0000 mg | ORAL_TABLET | ORAL | Status: DC | PRN

## 2020-03-31 MED ORDER — POLYETHYLENE GLYCOL 3350 17 G PO PACK: 17.0000 g | PACK | Freq: Every day | ORAL | Status: DC | PRN

## 2020-03-31 MED ORDER — SODIUM CHLORIDE 0.9 % IV BOLUS
125.0000 mL | Freq: Once | INTRAVENOUS | Status: AC
  Administered 2020-03-31: 125 mL via INTRAVENOUS

## 2020-03-31 MED ORDER — PROCHLORPERAZINE MALEATE 10 MG PO TABS
10.0000 mg | ORAL_TABLET | Freq: Four times a day (QID) | ORAL | Status: DC | PRN
  Filled 2020-03-31: qty 1

## 2020-03-31 MED ORDER — METOPROLOL TARTRATE 5 MG/5ML IV SOLN: 5.0000 mg | Freq: Four times a day (QID) | INTRAVENOUS | Status: DC | PRN

## 2020-03-31 MED ORDER — ONDANSETRON 4 MG PO TBDP: 4.0000 mg | ORAL_TABLET | Freq: Four times a day (QID) | ORAL | Status: DC | PRN

## 2020-03-31 MED ORDER — IOHEXOL 300 MG/ML  SOLN
100.0000 mL | Freq: Once | INTRAMUSCULAR | Status: AC | PRN
  Administered 2020-03-31: 100 mL via INTRAVENOUS

## 2020-03-31 MED ORDER — METHOCARBAMOL 500 MG PO TABS: 500.0000 mg | ORAL_TABLET | Freq: Three times a day (TID) | ORAL | Status: DC | PRN

## 2020-03-31 MED ORDER — DOCUSATE SODIUM 100 MG PO CAPS: 100.0000 mg | ORAL_CAPSULE | Freq: Two times a day (BID) | ORAL | Status: DC

## 2020-03-31 MED ORDER — BISACODYL 10 MG RE SUPP: 10.0000 mg | Freq: Every day | RECTAL | Status: DC | PRN

## 2020-03-31 NOTE — ED Triage Notes (Signed)
Pt arrived via [facility], pt had fallen in a parking lot. PT doesn't remember the fall. Per EMS pt has hematoma on the front of head. Pt has nausea. EMS gave zofran 4mg  IV. Pt is confused. It is unknown if pt is on blood thinners.

## 2020-03-31 NOTE — ED Notes (Signed)
Patient is resting comfortably. 

## 2020-03-31 NOTE — ED Notes (Signed)
Family at bedside. Pt is resting comfortably

## 2020-03-31 NOTE — Progress Notes (Signed)
Orthopedic Tech Progress Note Patient Details:  Tammy [name] [birth year] [id number] Level 2 trauma Patient ID: Tammy [name], female   DOB: 1941-02-18, 79 y.o.   MRN: [id number]   [name] 03/31/2020, 3:02 PM

## 2020-03-31 NOTE — ED Notes (Signed)
Patient transported to CT 

## 2020-03-31 NOTE — ED Notes (Signed)
Pt has small abrasion on left knee

## 2020-03-31 NOTE — ED Notes (Signed)
Dinner Tray Ordered @ 1714. 

## 2020-03-31 NOTE — H&P (Signed)
Tammy [name] is an 79 y.o. female.   Chief Complaint: Fall HPI: 79yo F was found down at [facility] with signs of head trauma. She does not remember falling. She C/O HA and nausea. She has been repetitive and confused at times in the ED. W/U revealed scalp hematoma and no other acute injuries but due to significant concussion symptoms we are asked to admit.   Past Medical History:  Diagnosis Date  . Thyroid disease     Past Surgical History:  Procedure Laterality Date  . ELBOW SURGERY Right   . FOOT SURGERY Left     No family history on file. Social History:  reports that she has quit smoking. She has never used smokeless tobacco. She reports that she does not drink alcohol and does not use drugs.  Allergies: No Known Allergies  (Not in a hospital admission)   Results for orders placed or performed during the hospital encounter of 03/31/20 (from the past 48 hour(s))  Comprehensive metabolic panel     Status: Abnormal   Collection Time: 03/31/20  2:49 PM  Result Value Ref Range   Sodium 130 (L) 135 - 145 mmol/L   Potassium 3.6 3.5 - 5.1 mmol/L   Chloride 99 98 - 111 mmol/L   CO2 20 (L) 22 - 32 mmol/L   Glucose, Bld 123 (H) 70 - 99 mg/dL    Comment: Glucose reference range applies only to samples taken after fasting for at least 8 hours.   BUN 17 8 - 23 mg/dL   Creatinine, Ser 0.63 0.44 - 1.00 mg/dL   Calcium 9.1 8.9 - 10.3 mg/dL   Total Protein 6.4 (L) 6.5 - 8.1 g/dL   Albumin 3.9 3.5 - 5.0 g/dL   AST 18 15 - 41 U/L   ALT 20 0 - 44 U/L   Alkaline Phosphatase 66 38 - 126 U/L   Total Bilirubin 0.5 0.3 - 1.2 mg/dL   GFR, Estimated >60 >60 mL/min    Comment: (NOTE) Calculated using the CKD-EPI Creatinine Equation (2021)    Anion gap 11 5 - 15    Comment: Performed at [facility] [address], [place] [address]  CBC     Status: None   Collection Time: 03/31/20  2:49 PM  Result Value Ref Range   WBC 7.8 4.0 - 10.5 K/uL   RBC 3.92 3.87 - 5.11 MIL/uL    Hemoglobin 12.0 12.0 - 15.0 g/dL   HCT 37.0 36 - 46 %   MCV 94.4 80.0 - 100.0 fL   MCH 30.6 26.0 - 34.0 pg   MCHC 32.4 30.0 - 36.0 g/dL   RDW 12.7 11.5 - 15.5 %   Platelets 297 150 - 400 K/uL   nRBC 0.0 0.0 - 0.2 %    Comment: Performed at [facility], [place] [address] [place], [place] [address]  Protime-INR     Status: None   Collection Time: 03/31/20  2:49 PM  Result Value Ref Range   Prothrombin Time 12.8 11.4 - 15.2 seconds   INR 1.0 0.8 - 1.2    Comment: (NOTE) INR goal varies based on device and disease states. Performed at [facility], [place] [address] [place], [place] [address]   I-Stat Chem 8, ED     Status: Abnormal   Collection Time: 03/31/20  3:16 PM  Result Value Ref Range   Sodium 131 (L) 135 - 145 mmol/L   Potassium 3.5 3.5 - 5.1  mmol/L   Chloride 99 98 - 111 mmol/L   BUN 18 8 - 23 mg/dL   Creatinine, Ser 0.50 0.44 - 1.00 mg/dL   Glucose, Bld 119 (H) 70 - 99 mg/dL    Comment: Glucose reference range applies only to samples taken after fasting for at least 8 hours.   Calcium, Ion 1.09 (L) 1.15 - 1.40 mmol/L   TCO2 21 (L) 22 - 32 mmol/L   Hemoglobin 12.6 12.0 - 15.0 g/dL   HCT 37.0 36 - 46 %  Lactic acid, plasma     Status: None   Collection Time: 03/31/20  3:50 PM  Result Value Ref Range   Lactic Acid, Venous 0.9 0.5 - 1.9 mmol/L    Comment: Performed at [facility] [address], [place], [place] [address]  Sample to Blood Bank     Status: None   Collection Time: 03/31/20  3:50 PM  Result Value Ref Range   Blood Bank Specimen SAMPLE AVAILABLE FOR TESTING    Sample Expiration      04/01/2020,2359 Performed at [facility], [place] [address] [place], [place] [address]    CT HEAD WO CONTRAST  Result Date: 03/31/2020 CLINICAL DATA:  Head injury, unwitnessed fall. EXAM: CT HEAD WITHOUT CONTRAST CT CERVICAL SPINE WITHOUT CONTRAST TECHNIQUE: Multidetector CT imaging of the head and cervical spine was performed following the  standard protocol without intravenous contrast. Multiplanar CT image reconstructions of the cervical spine were also generated. COMPARISON:  None. FINDINGS: CT HEAD FINDINGS Brain: No evidence of acute infarction, hemorrhage, hydrocephalus, extra-axial collection or mass lesion/mass effect. Vascular: No hyperdense vessel or unexpected calcification. Skull: Normal. Negative for fracture or focal lesion. Sinuses/Orbits: No acute finding. Other: Large left frontal scalp hematoma is noted. CT CERVICAL SPINE FINDINGS Alignment: Minimal grade 1 anterolisthesis of C4-5 is noted secondary to posterior facet joint hypertrophy. Skull base and vertebrae: No acute fracture. No primary bone lesion or focal pathologic process. Soft tissues and spinal canal: No prevertebral fluid or swelling. No visible canal hematoma. Disc levels:  Moderate degenerative disc disease is noted at C5-6. Upper chest: Negative. Other: None. IMPRESSION: 1. Large left frontal scalp hematoma. No acute intracranial abnormality seen. 2. Multilevel degenerative disc disease. No acute abnormality seen in the cervical spine. Electronically Signed   By: Marijo Conception M.D.   On: 03/31/2020 15:30   CT CHEST W CONTRAST  Result Date: 03/31/2020 CLINICAL DATA:  79 year old female with chest trauma. EXAM: CT CHEST, ABDOMEN, AND PELVIS WITH CONTRAST TECHNIQUE: Multidetector CT imaging of the chest, abdomen and pelvis was performed following the standard protocol during bolus administration of intravenous contrast. CONTRAST:  128mL OMNIPAQUE IOHEXOL 300 MG/ML  SOLN COMPARISON:  Chest radiograph dated 03/31/2020. FINDINGS: CT CHEST FINDINGS Cardiovascular: There is no cardiomegaly. Small pericardial effusion. Mild noncalcified atherosclerotic plaque of the aorta. No aneurysmal dilatation or dissection. The origins of the great vessels of the aortic arch appear patent as visualized. Mild dilatation of the main portal vein trunk suggestive of a degree of pulmonary  hypertension. Clinical correlation is recommended. Mediastinum/Nodes: No hilar or mediastinal adenopathy. There is a small hiatal hernia. The esophagus is grossly unremarkable.There is apparent mild diffuse stranding of the mediastinal fat surrounding the aorta and pulmonary arteries which may represent edema or contusion. No extravascular contrast to suggest active arterial bleed. No fluid collection or hematoma. Lungs/Pleura: Several small scattered pneumatoceles. There is minimal bibasilar dependent atelectasis. No focal consolidation, pleural effusion, pneumothorax. The central airways  are patent. Musculoskeletal: Degenerative changes of the spine. No acute osseous pathology. CT ABDOMEN PELVIS FINDINGS There is no intra-abdominal free air or free fluid. Hepatobiliary: Multiple hepatic cysts measure up to 3 cm in the dome of the liver. Several additional smaller hypodense lesions are too small to characterize. No intrahepatic biliary dilatation. The gallbladder is unremarkable. Pancreas: Unremarkable. No pancreatic ductal dilatation or surrounding inflammatory changes. Spleen: Normal in size without focal abnormality. Adrenals/Urinary Tract: The adrenal glands unremarkable there is no hydronephrosis on either side. There is symmetric enhancement and excretion of contrast by both kidneys. The urinary bladder is unremarkable. Stomach/Bowel: There is redundancy of the sigmoid colon. Several small scattered colonic diverticula without active inflammatory changes. The cecum is located in the left upper abdomen. The ligaments of Treitz is located in the midline suggestive of a degree of malrotation or non rotation. There is apparent protrusion of loops of small bowel and mesentery through a defect within the omentum concerning for an internal hernia. No definite bowel obstruction or volvulus noted. A linear air-fluid structure in the mid abdomen (62/6) most consistent with the appendix and appears unremarkable.  Vascular/Lymphatic: The abdominal aorta and IVC unremarkable. Faint low attenuating content within the common femoral veins bilaterally likely artifactual and mixing of contrast. DVT is less likely. Duplex ultrasound may provide better evaluation if there is high clinical concern for acute DVT. Reproductive: The uterus is not well visualized. No adnexal masses. Other: None Musculoskeletal: Degenerative changes of the spine and hips. No acute osseous pathology. IMPRESSION: 1. Mild diffuse stranding of the mediastinal fat surrounding the aorta and pulmonary arteries may represent edema or contusion. No extravascular contrast to suggest active arterial bleed. No fluid collection or hematoma. No other acute/traumatic intrathoracic, abdominal, or pelvic pathology. 2. 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 No bowel obstruction. Normal appendix. 3. Mixing artifact versus less likely DVT in the common femoral veins bilaterally. Duplex ultrasound may provide better evaluation if there is high clinical concern for acute DVT. 4. Aortic Atherosclerosis (ICD10-I70.0). Electronically Signed   By: Anner Crete M.D.   On: 03/31/2020 16:02   CT CERVICAL SPINE WO CONTRAST  Result Date: 03/31/2020 CLINICAL DATA:  Head injury, unwitnessed fall. EXAM: CT HEAD WITHOUT CONTRAST CT CERVICAL SPINE WITHOUT CONTRAST TECHNIQUE: Multidetector CT imaging of the head and cervical spine was performed following the standard protocol without intravenous contrast. Multiplanar CT image reconstructions of the cervical spine were also generated. COMPARISON:  None. FINDINGS: CT HEAD FINDINGS Brain: No evidence of acute infarction, hemorrhage, hydrocephalus, extra-axial collection or mass lesion/mass effect. Vascular: No hyperdense vessel or unexpected calcification. Skull: Normal. Negative for fracture or focal lesion. Sinuses/Orbits: No acute finding. Other: Large left frontal scalp hematoma is noted. CT CERVICAL SPINE  FINDINGS Alignment: Minimal grade 1 anterolisthesis of C4-5 is noted secondary to posterior facet joint hypertrophy. Skull base and vertebrae: No acute fracture. No primary bone lesion or focal pathologic process. Soft tissues and spinal canal: No prevertebral fluid or swelling. No visible canal hematoma. Disc levels:  Moderate degenerative disc disease is noted at C5-6. Upper chest: Negative. Other: None. IMPRESSION: 1. Large left frontal scalp hematoma. No acute intracranial abnormality seen. 2. Multilevel degenerative disc disease. No acute abnormality seen in the cervical spine. Electronically Signed   By: Marijo Conception M.D.   On: 03/31/2020 15:30   CT ABDOMEN PELVIS W CONTRAST  Result Date: 03/31/2020 CLINICAL DATA:  79 year old female with chest trauma. EXAM: CT CHEST, ABDOMEN, AND PELVIS WITH CONTRAST  TECHNIQUE: Multidetector CT imaging of the chest, abdomen and pelvis was performed following the standard protocol during bolus administration of intravenous contrast. CONTRAST:  173mL OMNIPAQUE IOHEXOL 300 MG/ML  SOLN COMPARISON:  Chest radiograph dated 03/31/2020. FINDINGS: CT CHEST FINDINGS Cardiovascular: There is no cardiomegaly. Small pericardial effusion. Mild noncalcified atherosclerotic plaque of the aorta. No aneurysmal dilatation or dissection. The origins of the great vessels of the aortic arch appear patent as visualized. Mild dilatation of the main portal vein trunk suggestive of a degree of pulmonary hypertension. Clinical correlation is recommended. Mediastinum/Nodes: No hilar or mediastinal adenopathy. There is a small hiatal hernia. The esophagus is grossly unremarkable.There is apparent mild diffuse stranding of the mediastinal fat surrounding the aorta and pulmonary arteries which may represent edema or contusion. No extravascular contrast to suggest active arterial bleed. No fluid collection or hematoma. Lungs/Pleura: Several small scattered pneumatoceles. There is minimal bibasilar  dependent atelectasis. No focal consolidation, pleural effusion, pneumothorax. The central airways are patent. Musculoskeletal: Degenerative changes of the spine. No acute osseous pathology. CT ABDOMEN PELVIS FINDINGS There is no intra-abdominal free air or free fluid. Hepatobiliary: Multiple hepatic cysts measure up to 3 cm in the dome of the liver. Several additional smaller hypodense lesions are too small to characterize. No intrahepatic biliary dilatation. The gallbladder is unremarkable. Pancreas: Unremarkable. No pancreatic ductal dilatation or surrounding inflammatory changes. Spleen: Normal in size without focal abnormality. Adrenals/Urinary Tract: The adrenal glands unremarkable there is no hydronephrosis on either side. There is symmetric enhancement and excretion of contrast by both kidneys. The urinary bladder is unremarkable. Stomach/Bowel: There is redundancy of the sigmoid colon. Several small scattered colonic diverticula without active inflammatory changes. The cecum is located in the left upper abdomen. The ligaments of Treitz is located in the midline suggestive of a degree of malrotation or non rotation. There is apparent protrusion of loops of small bowel and mesentery through a defect within the omentum concerning for an internal hernia. No definite bowel obstruction or volvulus noted. A linear air-fluid structure in the mid abdomen (62/6) most consistent with the appendix and appears unremarkable. Vascular/Lymphatic: The abdominal aorta and IVC unremarkable. Faint low attenuating content within the common femoral veins bilaterally likely artifactual and mixing of contrast. DVT is less likely. Duplex ultrasound may provide better evaluation if there is high clinical concern for acute DVT. Reproductive: The uterus is not well visualized. No adnexal masses. Other: None Musculoskeletal: Degenerative changes of the spine and hips. No acute osseous pathology. IMPRESSION: 1. Mild diffuse stranding of  the mediastinal fat surrounding the aorta and pulmonary arteries may represent edema or contusion. No extravascular contrast to suggest active arterial bleed. No fluid collection or hematoma. No other acute/traumatic intrathoracic, abdominal, or pelvic pathology. 2. 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 No bowel obstruction. Normal appendix. 3. Mixing artifact versus less likely DVT in the common femoral veins bilaterally. Duplex ultrasound may provide better evaluation if there is high clinical concern for acute DVT. 4. Aortic Atherosclerosis (ICD10-I70.0). Electronically Signed   By: Anner Crete M.D.   On: 03/31/2020 16:02   DG Chest Port 1 View  Result Date: 03/31/2020 CLINICAL DATA:  Recent fall, possible pneumothorax EXAM: PORTABLE CHEST 1 VIEW COMPARISON:  None. FINDINGS: The heart size and mediastinal contours are within normal limits. Both lungs are clear. The visualized skeletal structures are unremarkable. IMPRESSION: No active disease. Electronically Signed   By: Randa Ngo M.D.   On: 03/31/2020 15:03    Review of Systems  Constitutional: Negative.   HENT:       L forehead pain  Eyes: Negative.   Respiratory: Negative.   Cardiovascular: Negative for chest pain.  Gastrointestinal: Positive for nausea. Negative for abdominal pain, constipation and diarrhea.  Endocrine: Negative.   Genitourinary: Negative.   Musculoskeletal: Negative.   Skin: Negative.   Allergic/Immunologic: Negative.   Neurological:       Amnestic to event  Hematological: Negative.   Psychiatric/Behavioral: Negative.     Blood pressure 136/73, pulse 80, temperature (!) 97.2 F (36.2 C), temperature source Oral, resp. rate 16, height 5' 3.5" (1.613 m), weight 61.2 kg, SpO2 99 %. Physical Exam Constitutional:      General: She is not in acute distress. HENT:     Head:     Comments: L forehead hematoma and contusion Cardiovascular:     Rate and Rhythm: Normal rate and regular  rhythm.     Pulses: Normal pulses.     Heart sounds: Normal heart sounds.  Pulmonary:     Effort: Pulmonary effort is normal.     Breath sounds: Normal breath sounds. No stridor. No wheezing, rhonchi or rales.  Abdominal:     General: Abdomen is flat. There is no distension.     Palpations: Abdomen is soft. There is no mass.     Tenderness: There is no abdominal tenderness. There is no guarding or rebound.     Hernia: No hernia is present.  Musculoskeletal:        General: No swelling or tenderness. Normal range of motion.     Cervical back: Normal range of motion and neck supple. No rigidity or tenderness.  Skin:    General: Skin is warm.     Capillary Refill: Capillary refill takes 2 to 3 seconds.  Neurological:     Mental Status: She is alert and oriented to person, place, and time.     Comments: GCS 15, repetitive during exam  Psychiatric:        Mood and Affect: Mood normal.      Assessment/Plan Presumed GLF outside store Scalp hematoma and concussion - TBI team therapies Congenital bowel malrotation - asymptomatic  Admit for observation, Trauma Service. I spoke with her daughter.  Zenovia Jarred, MD 03/31/2020, 4:47 PM

## 2020-03-31 NOTE — ED Provider Notes (Signed)
[facility] EMERGENCY DEPARTMENT Provider Note   CSN: [id number] Arrival date & time: 03/31/20  1440     History Chief Complaint  Patient presents with  . Fall    Tammy [name] is a 79 y.o. female brought in by EMS as a level 2 trauma.  The patient is currently disoriented and unable to provide history.  According to EMS they were [facility].  The patient was found down on the ground with obvious signs of head injury.  No one witnessed the reason for her injury.  She was confused and bystanders called 911.  Upon arrival the patient was oriented only to herself.  Unable to provide a history of events.  Amnestic to the events, unknown sure of her medical history or if she is on blood thinners.  HPI     Past Medical History:  Diagnosis Date  . Thyroid disease     There are no problems to display for this patient.   Past Surgical History:  Procedure Laterality Date  . ELBOW SURGERY Right   . FOOT SURGERY Left      OB History   No obstetric history on file.     No family history on file.  Social History   Tobacco Use  . Smoking status: Former Research scientist (life sciences)  . Smokeless tobacco: Never Used  Substance Use Topics  . Alcohol use: Never  . Drug use: Never    Home Medications Prior to Admission medications   Not on File    Allergies    Patient has no known allergies.  Review of Systems   Review of Systems Unable to review history due to altered mental status Physical Exam Updated Vital Signs BP 139/71   Pulse 83   Temp (!) 97.2 F (36.2 C) (Oral)   Resp 18   Ht 5' 3.5" (1.613 m)   Wt 61.2 kg   SpO2 98%   BMI 23.54 kg/m   Physical Exam Vitals and nursing note reviewed.  Constitutional:      General: She is not in acute distress.    Appearance: She is well-developed and well-groomed. She is not diaphoretic.     Interventions: Cervical collar in place.  HENT:     Head: Normocephalic. Contusion present. No  right periorbital erythema.     Jaw: There is normal jaw occlusion.      Comments: Large left-sided frontotemporal hematoma Eyes:     General: No scleral icterus.    Conjunctiva/sclera: Conjunctivae normal.  Cardiovascular:     Rate and Rhythm: Normal rate and regular rhythm.     Heart sounds: Normal heart sounds. No murmur heard.  No friction rub. No gallop.   Pulmonary:     Effort: Pulmonary effort is normal. No respiratory distress.     Breath sounds: Normal breath sounds.  Abdominal:     General: Bowel sounds are normal. There is no distension.     Palpations: Abdomen is soft. There is no mass.     Tenderness: There is no abdominal tenderness. There is no guarding.  Musculoskeletal:        General: No swelling, tenderness, deformity or signs of injury.     Cervical back: Normal range of motion.  Skin:    General: Skin is warm and dry.  Neurological:     Mental Status: She is alert. She is disoriented and confused.     GCS: GCS eye subscore is 4. GCS verbal subscore is 4.  GCS motor subscore is 6.     Cranial Nerves: Cranial nerves are intact.     Sensory: No sensory deficit.     Motor: Motor function is intact.     Coordination: Coordination is intact.     Deep Tendon Reflexes:     Reflex Scores:      Patellar reflexes are 2+ on the right side and 2+ on the left side. Psychiatric:        Behavior: Behavior normal. Behavior is cooperative.     ED Results / Procedures / Treatments   Labs (all labs ordered are listed, but only abnormal results are displayed) Labs Reviewed  COMPREHENSIVE METABOLIC PANEL - Abnormal; Notable for the following components:      Result Value   Sodium 130 (*)    CO2 20 (*)    Glucose, Bld 123 (*)    Total Protein 6.4 (*)    All other components within normal limits  I-STAT CHEM 8, ED - Abnormal; Notable for the following components:   Sodium 131 (*)    Glucose, Bld 119 (*)    Calcium, Ion 1.09 (*)    TCO2 21 (*)    All other components  within normal limits  CBC  PROTIME-INR  ETHANOL  URINALYSIS, ROUTINE W REFLEX MICROSCOPIC  LACTIC ACID, PLASMA  SAMPLE TO BLOOD BANK    EKG EKG Interpretation  Date/Time:  Tuesday March 31 2020 15:17:18 EST Ventricular Rate:  84 PR Interval:    QRS Duration: 83 QT Interval:  374 QTC Calculation: 443 R Axis:   74 Text Interpretation: Sinus rhythm Atrial premature complex Borderline repolarization abnormality No old tracing to compare Confirmed by Dorie Rank [phone]) on 03/31/2020 3:43:44 PM   Radiology CT HEAD WO CONTRAST  Result Date: 03/31/2020 CLINICAL DATA:  Head injury, unwitnessed fall. EXAM: CT HEAD WITHOUT CONTRAST CT CERVICAL SPINE WITHOUT CONTRAST TECHNIQUE: Multidetector CT imaging of the head and cervical spine was performed following the standard protocol without intravenous contrast. Multiplanar CT image reconstructions of the cervical spine were also generated. COMPARISON:  None. FINDINGS: CT HEAD FINDINGS Brain: No evidence of acute infarction, hemorrhage, hydrocephalus, extra-axial collection or mass lesion/mass effect. Vascular: No hyperdense vessel or unexpected calcification. Skull: Normal. Negative for fracture or focal lesion. Sinuses/Orbits: No acute finding. Other: Large left frontal scalp hematoma is noted. CT CERVICAL SPINE FINDINGS Alignment: Minimal grade 1 anterolisthesis of C4-5 is noted secondary to posterior facet joint hypertrophy. Skull base and vertebrae: No acute fracture. No primary bone lesion or focal pathologic process. Soft tissues and spinal canal: No prevertebral fluid or swelling. No visible canal hematoma. Disc levels:  Moderate degenerative disc disease is noted at C5-6. Upper chest: Negative. Other: None. IMPRESSION: 1. Large left frontal scalp hematoma. No acute intracranial abnormality seen. 2. Multilevel degenerative disc disease. No acute abnormality seen in the cervical spine. Electronically Signed   By: Marijo Conception M.D.   On:  03/31/2020 15:30   CT CERVICAL SPINE WO CONTRAST  Result Date: 03/31/2020 CLINICAL DATA:  Head injury, unwitnessed fall. EXAM: CT HEAD WITHOUT CONTRAST CT CERVICAL SPINE WITHOUT CONTRAST TECHNIQUE: Multidetector CT imaging of the head and cervical spine was performed following the standard protocol without intravenous contrast. Multiplanar CT image reconstructions of the cervical spine were also generated. COMPARISON:  None. FINDINGS: CT HEAD FINDINGS Brain: No evidence of acute infarction, hemorrhage, hydrocephalus, extra-axial collection or mass lesion/mass effect. Vascular: No hyperdense vessel or unexpected calcification. Skull: Normal. Negative for fracture or focal lesion.  Sinuses/Orbits: No acute finding. Other: Large left frontal scalp hematoma is noted. CT CERVICAL SPINE FINDINGS Alignment: Minimal grade 1 anterolisthesis of C4-5 is noted secondary to posterior facet joint hypertrophy. Skull base and vertebrae: No acute fracture. No primary bone lesion or focal pathologic process. Soft tissues and spinal canal: No prevertebral fluid or swelling. No visible canal hematoma. Disc levels:  Moderate degenerative disc disease is noted at C5-6. Upper chest: Negative. Other: None. IMPRESSION: 1. Large left frontal scalp hematoma. No acute intracranial abnormality seen. 2. Multilevel degenerative disc disease. No acute abnormality seen in the cervical spine. Electronically Signed   By: Marijo Conception M.D.   On: 03/31/2020 15:30   DG Chest Port 1 View  Result Date: 03/31/2020 CLINICAL DATA:  Recent fall, possible pneumothorax EXAM: PORTABLE CHEST 1 VIEW COMPARISON:  None. FINDINGS: The heart size and mediastinal contours are within normal limits. Both lungs are clear. The visualized skeletal structures are unremarkable. IMPRESSION: No active disease. Electronically Signed   By: Randa Ngo M.D.   On: 03/31/2020 15:03    Procedures Procedures (including critical care time)  Medications Ordered in  ED Medications  sodium chloride 0.9 % bolus 125 mL (has no administration in time range)  iohexol (OMNIPAQUE) 300 MG/ML solution 100 mL (100 mLs Intravenous Contrast Given 03/31/20 1520)    ED Course  I have reviewed the triage vital signs and the nursing notes.  Pertinent labs & imaging results that were available during my care of the patient were reviewed by me and considered in my medical decision making (see chart for details).    MDM Rules/Calculators/A&P                          CC: Trauma VS:  Vitals:   03/31/20 1730 03/31/20 1745 03/31/20 1800 03/31/20 1815  BP: 140/73 (!) 149/79 130/67 135/69  Pulse: 74 72 73 78  Resp: 18 15 19 16   Temp:      TempSrc:      SpO2: 99% 98% 99% 97%  Weight:      Height:        EG:BTDVVOH is gathered by EMS. Previous records obtained and reviewed. DDX:The patient's complaint of trauma involves an extensive number of diagnostic and treatment options, and is a complaint that carries with it a high risk of complications, morbidity, and potential mortality. Given the large differential diagnosis, medical decision making is of high complexity. The emergent differential diagnosis for trauma is extensive and requires complex medical decision making. The differential includes, but is not limited to traumatic brain injury, Orbital trauma, maxillofacial trauma, skull fracture, blunt/penetrating neck trauma, vertebral artery dissection, whiplash, cervical fracture, neurogenic shock, spinal cord injury, thoracic trauma (blunt/penetrating) cardiac trauma, thoracic and lumbar spine trauma. Abdominal trauma (blunt. Penetrating), genitourinary trauma, extremity fractures, skin lacerations/ abrasions, vascular injuries.  Labs: I ordered reviewed and interpreted labs which include CBC without abnormality, CMP with mild hyponatremia in the setting of elevated blood glucose, UA negative, Covid test and flu swabs negative.  Lactate within normal limits. Imaging: I  ordered and reviewed images which included one-view portable chest x-ray, CT head, C-spine, chest abdomen pelvis with contrast. I independently visualized and interpreted all imaging.  There are no acute, significant findings on today's images.  Abnormal findings on the chest and abdomen film reviewed with Dr. Georganna Skeans of the trauma service.  She has no physical signs of trauma and suspect this may be slight over-read relates  to her head injury. EKG: EKG shows normal sinus rhythm at a rate of 84 Consults: Dr. Grandville Silos of the trauma service for admission MDM: Patient here with head injury, repetitive questioning and confusion.  Trauma scans are negative for significant internal injuries.  She has a large left frontotemporal hematoma.  Patient is still confused and not returned to baseline and will be admitted.  Her healthcare power of attorney's were notified at bedside. Patient disposition:The patient appears reasonably stabilized for admission considering the current resources, flow, and capabilities available in the ED at this time, and I doubt any other [facility] requiring further screening and/or treatment in the ED prior to admission.        Final Clinical Impression(s) / ED Diagnoses Final diagnoses:  Trauma  Trauma    Rx / DC Orders ED Discharge Orders    None       Margarita Mail, PA-C 03/31/20 1857    Dorie Rank, MD 04/02/20 [phone]

## 2020-03-31 NOTE — ED Notes (Signed)
Pt has a hematoma the size of a golf ball on the left side of forehead and it's ecchymotic.

## 2020-04-01 ENCOUNTER — Encounter (HOSPITAL_COMMUNITY): Payer: Self-pay

## 2020-04-01 ENCOUNTER — Other Ambulatory Visit: Payer: Self-pay

## 2020-04-01 ENCOUNTER — Other Ambulatory Visit: Payer: Self-pay | Admitting: Cardiology

## 2020-04-01 DIAGNOSIS — S060X0A Concussion without loss of consciousness, initial encounter: Secondary | ICD-10-CM | POA: Diagnosis not present

## 2020-04-01 DIAGNOSIS — S0003XA Contusion of scalp, initial encounter: Secondary | ICD-10-CM | POA: Diagnosis not present

## 2020-04-01 DIAGNOSIS — R55 Syncope and collapse: Secondary | ICD-10-CM

## 2020-04-01 LAB — CBC
HCT: 34.1 % — ABNORMAL LOW (ref 36.0–46.0)
Hemoglobin: 11.9 g/dL — ABNORMAL LOW (ref 12.0–15.0)
MCH: 31.6 pg (ref 26.0–34.0)
MCHC: 34.9 g/dL (ref 30.0–36.0)
MCV: 90.7 fL (ref 80.0–100.0)
Platelets: 277 10*3/uL (ref 150–400)
RBC: 3.76 MIL/uL — ABNORMAL LOW (ref 3.87–5.11)
RDW: 12.8 % (ref 11.5–15.5)
WBC: 7.2 10*3/uL (ref 4.0–10.5)
nRBC: 0 % (ref 0.0–0.2)

## 2020-04-01 LAB — BASIC METABOLIC PANEL
Anion gap: 9 (ref 5–15)
BUN: 8 mg/dL (ref 8–23)
CO2: 23 mmol/L (ref 22–32)
Calcium: 9.3 mg/dL (ref 8.9–10.3)
Chloride: 100 mmol/L (ref 98–111)
Creatinine, Ser: 0.59 mg/dL (ref 0.44–1.00)
GFR, Estimated: >60 mL/min (ref >60)
Glucose, Bld: 108 mg/dL — ABNORMAL HIGH (ref 70–99)
Potassium: 4 mmol/L (ref 3.5–5.1)
Sodium: 132 mmol/L — ABNORMAL LOW (ref 135–145)

## 2020-04-01 LAB — ETHANOL: Alcohol, Ethyl (B): <10 mg/dL (ref <10)

## 2020-04-01 MED ORDER — ACETAMINOPHEN 325 MG PO TABS
650.0000 mg | ORAL_TABLET | ORAL | Status: DC | PRN
Start: 2020-04-01 — End: 2020-06-02

## 2020-04-01 MED ORDER — POLYETHYLENE GLYCOL 3350 17 G PO PACK: 17.0000 g | PACK | Freq: Every day | ORAL | 0 refills | Status: DC | PRN

## 2020-04-01 NOTE — Progress Notes (Signed)
Admitted to room 6n26 from ED at this time. Ambulated from stretcher to BR- gait steady. Alert oriented x4. Pt states that she does not know why she fell. Will continue to monitor.

## 2020-04-01 NOTE — Plan of Care (Signed)

## 2020-04-01 NOTE — Evaluation (Signed)
Physical Therapy Evaluation & Discharge Patient Details Name: Tammy [name] MRN: [id number] DOB: 11-Jun-1940 Today's Date: 04/01/2020   History of Present Illness  Pt is a 79 y.o. female admitted 03/31/20 after found down at [facility] with signs of head trauma; pt c/o nausea and HA; confused at times in ED. Workup revealed scalp hematoma and significant concussion symptoms; no other acute injuries. No pertinent PMH on file.    Clinical Impression  Patient evaluated by Physical Therapy with no further acute PT needs identified. PTA, pt independent, active and lives with husband with advanced dementia, pt cares for him as well as caregivers who assist 5x/wk. Today, pt able to mobilize independently. DGI score of 21/24 does not indicate increased risk for fall with higher level balance activities. All education has been completed and the patient has no further questions. Acute PT is signing off. Thank you for this referral.  Of note, pt reports "slight" lightheadedness while ambulating (reports "this happens sometimes... my pressure is normally low" - endorses past falls but never "as bad as" this one) Seated BP 124/84 Standing BP 133/72    Follow Up Recommendations No PT follow up    Equipment Recommendations  None recommended by PT    Recommendations for Other Services       Precautions / Restrictions Precautions Precautions: Fall;Other (comment) Precaution Comments: post-concussion Restrictions Weight Bearing Restrictions: No      Mobility  Bed Mobility Overal bed mobility: Modified Independent             General bed mobility comments: Received sitting in recliner    Transfers Overall transfer level: Independent Equipment used: None                Ambulation/Gait Ambulation/Gait assistance: Independent Gait Distance (Feet): 150 Feet Assistive device: None Gait Pattern/deviations: WFL(Within Functional Limits)     General Gait Details: Declined hallway  ambulation due to not wanting to wear mask in hallway; ambulated multiple laps in room with various higher level balance tasks incorporated; independent with no overt instability or LOb  Stairs            Wheelchair Mobility    Modified Rankin (Stroke Patients Only)       Balance Overall balance assessment: Independent                               Standardized Balance Assessment Standardized Balance Assessment : Dynamic Gait Index   Dynamic Gait Index Level Surface: Normal Change in Gait Speed: Mild Impairment Gait with Horizontal Head Turns: Normal Gait with Vertical Head Turns: Normal Gait and Pivot Turn: Normal Step Over Obstacle: Mild Impairment Step Around Obstacles: Normal Steps: Mild Impairment Total Score: 21       Pertinent Vitals/Pain Pain Assessment: Faces Pain Score: 2  Faces Pain Scale: Hurts a little bit Pain Location: L foot (cannot localize) Pain Descriptors / Indicators: Sore Pain Intervention(s): Monitored during session    Home Living Family/patient expects to be discharged to:: Private residence Living Arrangements: Spouse/significant other Available Help at Discharge: Other (Comment) Type of Home: House Home Access: Ramped entrance     Home Layout: Multi-level;Able to live on main level with bedroom/bathroom Home Equipment: Gilford Rile - 2 wheels;Bedside commode;Shower seat;Grab bars - toilet;Grab bars - tub/shower;Wheelchair - manual Additional Comments: 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; also has caregivers for him 4 hr/day for 5 day/wk    Prior Function Level of Independence:  Independent         Comments: driving, spouse's caregiver     Hand Dominance   Dominant Hand: Right    Extremity/Trunk Assessment   Upper Extremity Assessment Upper Extremity Assessment: Overall WFL for tasks assessed    Lower Extremity Assessment Lower Extremity Assessment: Overall WFL for tasks assessed LLE Deficits /  Details: L knee abrasion; no pain reported    Cervical / Trunk Assessment Cervical / Trunk Assessment: Normal  Communication   Communication: HOH  Cognition Arousal/Alertness: Awake/alert Behavior During Therapy: WFL for tasks assessed/performed Overall Cognitive Status: Within Functional Limits for tasks assessed                                 General Comments: Pt A/O x4; Pt scored 27/30 on SLUMS with normal cognition. Pt only able to recall 2/5 items after 3 mins and pt reports that she was "no good" at recalling items before the fall.       General Comments General comments (skin integrity, edema, etc.): C/o "slight" lightheadedness while walking; seated BP 124/84, standing BP 133/72    Exercises     Assessment/Plan    PT Assessment Patent does not need any further PT services  PT Problem List         PT Treatment Interventions      PT Goals (Current goals can be found in the Care Plan section)  Acute Rehab PT Goals Patient Stated Goal: to go home to my husband PT Goal Formulation: All assessment and education complete, DC therapy    Frequency     Barriers to discharge        Co-evaluation               AM-PAC PT "6 Clicks" Mobility  Outcome Measure Help needed turning from your back to your side while in a flat bed without using bedrails?: None Help needed moving from lying on your back to sitting on the side of a flat bed without using bedrails?: None Help needed moving to and from a bed to a chair (including a wheelchair)?: None Help needed standing up from a chair using your arms (e.g., wheelchair or bedside chair)?: None Help needed to walk in hospital room?: None Help needed climbing 3-5 steps with a railing? : None 6 Click Score: 24    End of Session   Activity Tolerance: Patient tolerated treatment well Patient left: in chair;with call bell/phone within reach;with chair alarm set Nurse Communication: Mobility status PT Visit  Diagnosis: Other abnormalities of gait and mobility (R26.89)    Time: 2423-5361 PT Time Calculation (min) (ACUTE ONLY): 14 min   Charges:   PT Evaluation $PT Eval Low Complexity: [place], PT, DPT Acute Rehabilitation Services  Pager [phone] Office [phone]  Derry Lory 04/01/2020, 9:41 AM

## 2020-04-01 NOTE — TOC CAGE-AID Note (Signed)
Transition of Care [facility]) - CAGE-AID Screening   Patient Details  Name: Tammy [name] MRN: [id number] Date of Birth: Dec 12, 1940  Transition of Care [facility]) CM/SW Contact:    Emeterio Reeve, [place] Phone Number: 04/01/2020, 4:33 PM   Clinical Narrative:  CSW met with pt at bedside. CSW introduced self and explained role at the hospital.  Pt denies alcohol use. Pt denies substance use. Pt did not need any resources at this time.    CAGE-AID Screening:    Have You Ever Felt You Ought to Cut Down on Your Drinking or Drug Use?: No Have People Annoyed You By Critizing Your Drinking Or Drug Use?: No Have You Felt Bad Or Guilty About Your Drinking Or Drug Use?: No Have You Ever Had a Drink or Used Drugs First Thing In The Morning to Steady Your Nerves or to Get Rid of a Hangover?: No CAGE-AID Score: 0  Substance Abuse Education Offered: Yes       Blima Ledger, [place] Social Worker [phone]

## 2020-04-01 NOTE — Discharge Summary (Signed)
[facility] Discharge Summary   Patient ID: Tammy [name] MRN: [id number] DOB/AGE: 1940/10/15 79 y.o.  Admit date: 03/31/2020 Discharge date: 04/01/2020  Admitting Diagnosis: Presumed GLF outside store Scalp hematoma and concussion Congenital bowel malrotation  Discharge Diagnosis Same as above  Consultants None  Imaging: CT HEAD WO CONTRAST  Result Date: 03/31/2020 CLINICAL DATA:  Head injury, unwitnessed fall. EXAM: CT HEAD WITHOUT CONTRAST CT CERVICAL SPINE WITHOUT CONTRAST TECHNIQUE: Multidetector CT imaging of the head and cervical spine was performed following the standard protocol without intravenous contrast. Multiplanar CT image reconstructions of the cervical spine were also generated. COMPARISON:  None. FINDINGS: CT HEAD FINDINGS Brain: No evidence of acute infarction, hemorrhage, hydrocephalus, extra-axial collection or mass lesion/mass effect. Vascular: No hyperdense vessel or unexpected calcification. Skull: Normal. Negative for fracture or focal lesion. Sinuses/Orbits: No acute finding. Other: Large left frontal scalp hematoma is noted. CT CERVICAL SPINE FINDINGS Alignment: Minimal grade 1 anterolisthesis of C4-5 is noted secondary to posterior facet joint hypertrophy. Skull base and vertebrae: No acute fracture. No primary bone lesion or focal pathologic process. Soft tissues and spinal canal: No prevertebral fluid or swelling. No visible canal hematoma. Disc levels:  Moderate degenerative disc disease is noted at C5-6. Upper chest: Negative. Other: None. IMPRESSION: 1. Large left frontal scalp hematoma. No acute intracranial abnormality seen. 2. Multilevel degenerative disc disease. No acute abnormality seen in the cervical spine. Electronically Signed   By: Marijo Conception M.D.   On: 03/31/2020 15:30   CT CHEST W CONTRAST  Result Date: 03/31/2020 CLINICAL DATA:  79 year old female with chest trauma. EXAM: CT CHEST, ABDOMEN, AND PELVIS WITH CONTRAST TECHNIQUE:  Multidetector CT imaging of the chest, abdomen and pelvis was performed following the standard protocol during bolus administration of intravenous contrast. CONTRAST:  129mL OMNIPAQUE IOHEXOL 300 MG/ML  SOLN COMPARISON:  Chest radiograph dated 03/31/2020. FINDINGS: CT CHEST FINDINGS Cardiovascular: There is no cardiomegaly. Small pericardial effusion. Mild noncalcified atherosclerotic plaque of the aorta. No aneurysmal dilatation or dissection. The origins of the great vessels of the aortic arch appear patent as visualized. Mild dilatation of the main portal vein trunk suggestive of a degree of pulmonary hypertension. Clinical correlation is recommended. Mediastinum/Nodes: No hilar or mediastinal adenopathy. There is a small hiatal hernia. The esophagus is grossly unremarkable.There is apparent mild diffuse stranding of the mediastinal fat surrounding the aorta and pulmonary arteries which may represent edema or contusion. No extravascular contrast to suggest active arterial bleed. No fluid collection or hematoma. Lungs/Pleura: Several small scattered pneumatoceles. There is minimal bibasilar dependent atelectasis. No focal consolidation, pleural effusion, pneumothorax. The central airways are patent. Musculoskeletal: Degenerative changes of the spine. No acute osseous pathology. CT ABDOMEN PELVIS FINDINGS There is no intra-abdominal free air or free fluid. Hepatobiliary: Multiple hepatic cysts measure up to 3 cm in the dome of the liver. Several additional smaller hypodense lesions are too small to characterize. No intrahepatic biliary dilatation. The gallbladder is unremarkable. Pancreas: Unremarkable. No pancreatic ductal dilatation or surrounding inflammatory changes. Spleen: Normal in size without focal abnormality. Adrenals/Urinary Tract: The adrenal glands unremarkable there is no hydronephrosis on either side. There is symmetric enhancement and excretion of contrast by both kidneys. The urinary bladder is  unremarkable. Stomach/Bowel: There is redundancy of the sigmoid colon. Several small scattered colonic diverticula without active inflammatory changes. The cecum is located in the left upper abdomen. The ligaments of Treitz is located in the midline suggestive of a degree of malrotation or non rotation. There is apparent  protrusion of loops of small bowel and mesentery through a defect within the omentum concerning for an internal hernia. No definite bowel obstruction or volvulus noted. A linear air-fluid structure in the mid abdomen (62/6) most consistent with the appendix and appears unremarkable. Vascular/Lymphatic: The abdominal aorta and IVC unremarkable. Faint low attenuating content within the common femoral veins bilaterally likely artifactual and mixing of contrast. DVT is less likely. Duplex ultrasound may provide better evaluation if there is high clinical concern for acute DVT. Reproductive: The uterus is not well visualized. No adnexal masses. Other: None Musculoskeletal: Degenerative changes of the spine and hips. No acute osseous pathology. IMPRESSION: 1. Mild diffuse stranding of the mediastinal fat surrounding the aorta and pulmonary arteries may represent edema or contusion. No extravascular contrast to suggest active arterial bleed. No fluid collection or hematoma. No other acute/traumatic intrathoracic, abdominal, or pelvic pathology. 2. 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 No bowel obstruction. Normal appendix. 3. Mixing artifact versus less likely DVT in the common femoral veins bilaterally. Duplex ultrasound may provide better evaluation if there is high clinical concern for acute DVT. 4. Aortic Atherosclerosis (ICD10-I70.0). Electronically Signed   By: Anner Crete M.D.   On: 03/31/2020 16:02   CT CERVICAL SPINE WO CONTRAST  Result Date: 03/31/2020 CLINICAL DATA:  Head injury, unwitnessed fall. EXAM: CT HEAD WITHOUT CONTRAST CT CERVICAL SPINE WITHOUT CONTRAST  TECHNIQUE: Multidetector CT imaging of the head and cervical spine was performed following the standard protocol without intravenous contrast. Multiplanar CT image reconstructions of the cervical spine were also generated. COMPARISON:  None. FINDINGS: CT HEAD FINDINGS Brain: No evidence of acute infarction, hemorrhage, hydrocephalus, extra-axial collection or mass lesion/mass effect. Vascular: No hyperdense vessel or unexpected calcification. Skull: Normal. Negative for fracture or focal lesion. Sinuses/Orbits: No acute finding. Other: Large left frontal scalp hematoma is noted. CT CERVICAL SPINE FINDINGS Alignment: Minimal grade 1 anterolisthesis of C4-5 is noted secondary to posterior facet joint hypertrophy. Skull base and vertebrae: No acute fracture. No primary bone lesion or focal pathologic process. Soft tissues and spinal canal: No prevertebral fluid or swelling. No visible canal hematoma. Disc levels:  Moderate degenerative disc disease is noted at C5-6. Upper chest: Negative. Other: None. IMPRESSION: 1. Large left frontal scalp hematoma. No acute intracranial abnormality seen. 2. Multilevel degenerative disc disease. No acute abnormality seen in the cervical spine. Electronically Signed   By: Marijo Conception M.D.   On: 03/31/2020 15:30   CT ABDOMEN PELVIS W CONTRAST  Result Date: 03/31/2020 CLINICAL DATA:  79 year old female with chest trauma. EXAM: CT CHEST, ABDOMEN, AND PELVIS WITH CONTRAST TECHNIQUE: Multidetector CT imaging of the chest, abdomen and pelvis was performed following the standard protocol during bolus administration of intravenous contrast. CONTRAST:  144mL OMNIPAQUE IOHEXOL 300 MG/ML  SOLN COMPARISON:  Chest radiograph dated 03/31/2020. FINDINGS: CT CHEST FINDINGS Cardiovascular: There is no cardiomegaly. Small pericardial effusion. Mild noncalcified atherosclerotic plaque of the aorta. No aneurysmal dilatation or dissection. The origins of the great vessels of the aortic arch appear  patent as visualized. Mild dilatation of the main portal vein trunk suggestive of a degree of pulmonary hypertension. Clinical correlation is recommended. Mediastinum/Nodes: No hilar or mediastinal adenopathy. There is a small hiatal hernia. The esophagus is grossly unremarkable.There is apparent mild diffuse stranding of the mediastinal fat surrounding the aorta and pulmonary arteries which may represent edema or contusion. No extravascular contrast to suggest active arterial bleed. No fluid collection or hematoma. Lungs/Pleura: Several small scattered pneumatoceles. There  is minimal bibasilar dependent atelectasis. No focal consolidation, pleural effusion, pneumothorax. The central airways are patent. Musculoskeletal: Degenerative changes of the spine. No acute osseous pathology. CT ABDOMEN PELVIS FINDINGS There is no intra-abdominal free air or free fluid. Hepatobiliary: Multiple hepatic cysts measure up to 3 cm in the dome of the liver. Several additional smaller hypodense lesions are too small to characterize. No intrahepatic biliary dilatation. The gallbladder is unremarkable. Pancreas: Unremarkable. No pancreatic ductal dilatation or surrounding inflammatory changes. Spleen: Normal in size without focal abnormality. Adrenals/Urinary Tract: The adrenal glands unremarkable there is no hydronephrosis on either side. There is symmetric enhancement and excretion of contrast by both kidneys. The urinary bladder is unremarkable. Stomach/Bowel: There is redundancy of the sigmoid colon. Several small scattered colonic diverticula without active inflammatory changes. The cecum is located in the left upper abdomen. The ligaments of Treitz is located in the midline suggestive of a degree of malrotation or non rotation. There is apparent protrusion of loops of small bowel and mesentery through a defect within the omentum concerning for an internal hernia. No definite bowel obstruction or volvulus noted. A linear air-fluid  structure in the mid abdomen (62/6) most consistent with the appendix and appears unremarkable. Vascular/Lymphatic: The abdominal aorta and IVC unremarkable. Faint low attenuating content within the common femoral veins bilaterally likely artifactual and mixing of contrast. DVT is less likely. Duplex ultrasound may provide better evaluation if there is high clinical concern for acute DVT. Reproductive: The uterus is not well visualized. No adnexal masses. Other: None Musculoskeletal: Degenerative changes of the spine and hips. No acute osseous pathology. IMPRESSION: 1. Mild diffuse stranding of the mediastinal fat surrounding the aorta and pulmonary arteries may represent edema or contusion. No extravascular contrast to suggest active arterial bleed. No fluid collection or hematoma. No other acute/traumatic intrathoracic, abdominal, or pelvic pathology. 2. 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 No bowel obstruction. Normal appendix. 3. Mixing artifact versus less likely DVT in the common femoral veins bilaterally. Duplex ultrasound may provide better evaluation if there is high clinical concern for acute DVT. 4. Aortic Atherosclerosis (ICD10-I70.0). Electronically Signed   By: Anner Crete M.D.   On: 03/31/2020 16:02   DG Chest Port 1 View  Result Date: 03/31/2020 CLINICAL DATA:  Recent fall, possible pneumothorax EXAM: PORTABLE CHEST 1 VIEW COMPARISON:  None. FINDINGS: The heart size and mediastinal contours are within normal limits. Both lungs are clear. The visualized skeletal structures are unremarkable. IMPRESSION: No active disease. Electronically Signed   By: Randa Ngo M.D.   On: 03/31/2020 15:03    Procedures None  Hospital Course:  Tammy [name] is a 79yo female who was [facility] as a non-level trauma after being found down at [facility] with signs of head trauma. She does not remember falling. She C/O HA and nausea. She has been repetitive and confused  at times in the ED. W/U revealed scalp hematoma and no other acute injuries but due to significant concussion symptoms trauma asked to admit.  The following morning her concussive symptoms (ie headache, nausea, repetitive questioning) improved, but she still could not recall the incident. EKG normal sinus rhythm. No orthostatic hypotension. Unsure if possible syncopal event led to the fall, therefore case discussed with hospitalist team who recommended outpatient holter monitor.  Patient worked with TBI team therapies during this admission. On 12/1, the patient was voiding well, tolerating diet, ambulating well, pain well controlled, vital signs stable and felt stable for discharge home.  Patient was advised not to drive and to follow up as below with cardiology and PCP. She knows to call with questions or concerns.      Allergies as of 04/01/2020   No Known Allergies     Medication List    TAKE these medications   acetaminophen 325 MG tablet Commonly known as: TYLENOL Take 2 tablets (650 mg total) by mouth every 4 (four) hours as needed for mild pain.   ascorbic acid 500 MG tablet Commonly known as: VITAMIN C Take 500 mg by mouth daily.   HAIR/SKIN/NAILS PO Take 1 tablet by mouth daily.   magnesium oxide 400 MG tablet Commonly known as: MAG-OX Take 400 mg by mouth at bedtime.   polyethylene glycol 17 g packet Commonly known as: MIRALAX / GLYCOLAX Take 17 g by mouth daily as needed for mild constipation.   Synthroid 125 MCG tablet Generic drug: levothyroxine Take 125 mcg by mouth daily.         Follow-up Information    Shon Baton, MD. Call.   Specialty: Internal Medicine Why: Follow up as scheduled this month with your primary care physician Contact information: [place] [address] [phone]        [facility]. Call.   Why: Their office will call you to set up an outpatient holter (cardiac)  monitor Contact information: [place] [address] [phone]              Signed: Wellington [place], [facility] 04/01/2020, 2:56 PM Please see Amion for pager number during day hours 7:00am-4:30pm

## 2020-04-01 NOTE — Evaluation (Signed)
Occupational Therapy Evaluation Patient Details Name: Tammy [name] MRN: [id number] DOB: 1940-09-01 Today's Date: 04/01/2020    History of Present Illness Pt is a 79 y.o. female admitted 03/31/20 after found down at [facility] with signs of head trauma; pt c/o nausea and HA; confused at times in ED. Workup revealed scalp hematoma and significant concussion symptoms; no other acute injuries. No pertinent PMH on file.   Clinical Impression   Pt PTA: pt is her spouse's caregiver due to his advanced dementia. Pt's spouse has caregivers 4 hours daily/5 days a week for his ADL/IADL needs. Pt was independent with ADL and mobility. Pt currently, Pt A/O x4; Pt scored 27/30 on SLUMS with normal cognition. Pt only able to recall 2/5 items after 3 mins and pt reports that she was "no good" at recalling items before the fall.  Pt ambulating in room with no AD; pt performing ADL tasks at sink with no LOB and good safety awareness with modified independence. Pt currently requiring no physical assist for ADL. Pt does not require continued OT skilled services. OT signing off.      Follow Up Recommendations  No OT follow up    Equipment Recommendations  None recommended by OT    Recommendations for Other Services       Precautions / Restrictions Precautions Precautions: Fall;Other (comment) Precaution Comments: concussion Restrictions Weight Bearing Restrictions: No      Mobility Bed Mobility Overal bed mobility: Modified Independent                  Transfers Overall transfer level: Modified independent                    Balance Overall balance assessment: No apparent balance deficits (not formally assessed)                                         ADL either performed or assessed with clinical judgement   ADL Overall ADL's : Modified independent                                       General ADL Comments: Pt ambulating in room with no  AD; pt performing ADl tasks at sink with no LOB and good safety awareness. Pt requires no AD for mobility. Pt currently requiring no physical assist for ADL.     Vision Baseline Vision/History: Wears glasses Wears Glasses: Reading only Patient Visual Report: No change from baseline Vision Assessment?: No apparent visual deficits Additional Comments: Pt reports an irritation in eye prior to fall. Vision has no changes     Perception     Praxis      Pertinent Vitals/Pain Pain Assessment: 0-10 Pain Score: 2  Pain Location: head and L foot Pain Descriptors / Indicators: Discomfort;Sore Pain Intervention(s): Monitored during session     Hand Dominance Right   Extremity/Trunk Assessment Upper Extremity Assessment Upper Extremity Assessment: Defer to OT evaluation   Lower Extremity Assessment Lower Extremity Assessment: Overall WFL for tasks assessed;LLE deficits/detail LLE Deficits / Details: L knee abrasion; no pain reported   Cervical / Trunk Assessment Cervical / Trunk Assessment: Normal   Communication Communication Communication: No difficulties   Cognition Arousal/Alertness: Awake/alert Behavior During Therapy: WFL for tasks assessed/performed Overall Cognitive Status: Within Functional Limits for tasks  assessed                                 General Comments: Pt A/O x4; Pt scored 27/30 on SLUMS with normal cognition. Pt only able to recall 2/5 items after 3 mins and pt reports that she was "no good" at recalling items before the fall.    General Comments       Exercises     Shoulder Instructions      Home Living Family/patient expects to be discharged to:: Private residence Living Arrangements: Spouse/significant other (with advanced dementia) Available Help at Discharge: Other (Comment) (Pt's spouse has CGs 4 hours x5 days/wk) Type of Home: House Home Access: Ramped entrance     Home Layout: Multi-level;Able to live on main level with  bedroom/bathroom Alternate Level Stairs-Number of Steps: full flight Alternate Level Stairs-Rails: Can reach both Bathroom Shower/Tub: Occupational psychologist: Handicapped height     Home Equipment: Environmental consultant - 2 wheels;Bedside commode;Shower seat;Grab bars - toilet;Grab bars - tub/shower;Wheelchair - manual   Additional Comments: Pt does not [place] AD or DME for self; most is for spouse with advanced dementia.      Prior Functioning/Environment Level of Independence: Independent        Comments: driving, spouse's caregiver        OT Problem List: Pain      OT Treatment/Interventions:      OT Goals(Current goals can be found in the care plan section) Acute Rehab OT Goals Patient Stated Goal: to go home to my husband OT Goal Formulation: All assessment and education complete, DC therapy  OT Frequency:     Barriers to D/C:            Co-evaluation              AM-PAC OT "6 Clicks" Daily Activity     Outcome Measure Help from another person eating meals?: None Help from another person taking care of personal grooming?: None Help from another person toileting, which includes using toliet, bedpan, or urinal?: None Help from another person bathing (including washing, rinsing, drying)?: None Help from another person to put on and taking off regular upper body clothing?: None Help from another person to put on and taking off regular lower body clothing?: None 6 Click Score: 24   End of Session Nurse Communication: Mobility status  Activity Tolerance: Patient tolerated treatment well Patient left: in chair;with call bell/phone within reach;with chair alarm set  OT Visit Diagnosis: Unsteadiness on feet (R26.81);Pain Pain - Right/Left: Left Pain - part of body: Ankle and joints of foot (and head)                Time: 0828-0900 OT Time Calculation (min): 32 min Charges:  OT General Charges $OT Visit: 1 Visit OT Evaluation $OT Eval Moderate Complexity: 1  Mod OT Treatments $Self Care/Home Management : 8-22 mins  Jefferey Pica, OTR/L Acute Rehabilitation Services Pager: [phone] Office: [phone]   [name] 04/01/2020, 9:15 AM

## 2020-04-01 NOTE — Discharge Instructions (Signed)
Concussion, Adult  A concussion is a brain injury from a hard, direct hit (trauma) to your head or body. This direct hit causes the brain to quickly shake back and forth inside the skull. A concussion may also be called a mild traumatic brain injury (TBI). Healing from this injury can take time. What are the causes? This condition is caused by:  A direct hit to your head, such as: ? Running into a player during a game. ? Being hit in a fight. ? Hitting your head on a hard surface.  A quick and sudden movement (jolt) of the head or neck, such as in a car crash. What are the signs or symptoms? The signs of a concussion can be hard to notice. They may be missed by you, family members, and doctors. You may look fine on the outside but may not act or feel normal. Physical symptoms  Headaches.  Being tired (fatigued).  Being dizzy.  Problems with body balance.  Problems seeing or hearing.  Being sensitive to light or noise.  Feeling sick to your stomach (nausea) or throwing up (vomiting).  Not sleeping or eating as you used to.  Loss of feeling (numbness) or tingling in the body.  Seizure. Mental and emotional symptoms  Problems remembering things.  Trouble focusing your mind (concentrating), organizing, or making decisions.  Being slow to think, act, react, speak, or read.  Feeling grouchy (irritable).  Having mood changes.  Feeling worried or nervous (anxious).  Feeling sad (depressed). How is this treated? This condition may be treated by:  Stopping sports or activity if you are injured. If you hit your head or have signs of concussion: ? Do not return to sports or activities the same day. ? Get checked by a doctor before you return to your activities.  Resting your body and your mind.  Being watched carefully, often at home.  Medicines to help with symptoms such as: ? Feeling sick to your stomach. ? Headaches. ? Problems with sleep.  Avoid taking strong  pain medicines (opioids) for a concussion.  Avoiding alcohol and drugs.  Being asked to go to a concussion clinic or a place to help you recover (rehabilitation center). Recovery from a concussion can take time. Return to activities only:  When you are fully healed.  When your doctor says it is safe. Follow these instructions at home: Activity  Limit activities that need a lot of thought or focus, such as: ? Homework or work for your job. ? Watching TV. ? Using the computer or phone. ? Playing memory games and puzzles.  Rest. Rest helps your brain heal. Make sure you: ? Get plenty of sleep. Most adults should get 7-9 hours of sleep each night. ? Rest during the day. Take naps or breaks when you feel tired.  Avoid activity like exercise until your doctor says its safe. Stop any activity that makes symptoms worse.  Do not do activities that could cause a second concussion, such as riding a bike or playing sports.  Ask your doctor when you can return to your normal activities, such as school, work, sports, and driving. Your ability to react may be slower. Do not do these activities if you are dizzy. General instructions   Take over-the-counter and prescription medicines only as told by your doctor.  Do not drink alcohol until your doctor says you can.  Watch your symptoms and tell other people to do the same. Other problems can occur after a concussion. Older   adults have a higher risk of serious problems.  Tell your work manager, teachers, school nurse, school counselor, coach, or athletic trainer about your injury and symptoms. Tell them about what you can or cannot do.  Keep all follow-up visits as told by your doctor. This is important. How is this prevented?  It is very important that you do not get another brain injury. In rare cases, another injury can cause brain damage that will not go away, brain swelling, or death. The risk of this is greatest in the first 7-10 days  after a head injury. To avoid injuries: ? Stop activities that could lead to a second concussion, such as contact sports, until your doctor says it is okay. ? When you return to sports or activities:  Do not crash into other players. This is how most concussions happen.  Follow the rules.  Respect other players. ? Get regular exercise. Do strength and balance training. ? Wear a helmet that fits you well during sports, biking, or other activities.  Helmets can help protect you from serious skull and brain injuries, but they do not protect you from a concussion. Even when wearing a helmet, you should avoid being hit in the head. Contact a doctor if:  Your symptoms get worse or they do not get better.  You have new symptoms.  You have another injury. Get help right away if:  You have bad headaches or your headaches get worse.  You feel weak or numb in any part of your body.  You are mixed up (confused).  Your balance gets worse.  You keep throwing up.  You feel more sleepy than normal.  Your speech is not clear (is slurred).  You cannot recognize people or places.  You have a seizure.  Others have trouble waking you up.  You have behavior changes.  You have changes in how you see (vision).  You pass out (lose consciousness). Summary  A concussion is a brain injury from a hard, direct hit (trauma) to your head or body.  This condition is treated with rest and careful watching of symptoms.  If you keep having symptoms, call your doctor. This information is not intended to replace advice given to you by your health care provider. Make sure you discuss any questions you have with your health care provider. Document Revised: 12/07/2017 Document Reviewed: 12/07/2017 Elsevier Patient Education  2020 [employer]  

## 2020-04-01 NOTE — Evaluation (Signed)
Speech Language Pathology Evaluation Patient Details Name: Tammy [name] MRN: [id number] DOB: 11-05-1940 Today's Date: 04/01/2020 Time: 7408-1448 SLP Time Calculation (min) (ACUTE ONLY): 43 min  Problem List:  Patient Active Problem List   Diagnosis Date Noted  . Concussion 03/31/2020   Past Medical History:  Past Medical History:  Diagnosis Date  . Hypothyroidism   . Thyroid disease    Past Surgical History:  Past Surgical History:  Procedure Laterality Date  . ELBOW SURGERY Right   . FOOT SURGERY Left    HPI:  Pt is a 80 y.o. female admitted 03/31/20 after found down at [facility] with signs of head trauma; pt c/o nausea and HA; confused at times in ED. Workup revealed scalp hematoma and significant concussion symptoms; no other acute injuries. No pertinent PMH on file.    Assessment / Plan / Recommendation Clinical Impression  Pt scored 29/30 on the [facility], and also note her score of 27/30 on the [facility] earlier today. She describes feeling a little "foggy" but is remembering more events from the hospital stay (still amnestic to the fall). Pt does endorse a "bad" memory at baseline, and describes some situations in which it could impact her safety, such as putting a pot of water on the stove to boil and forgetting about it. However, she says that this happens only occasionally, denying any increase in frequency and that she has had episodes like this for "her whole life." I think that she is at least at her cognitive baseline today, although did review some post-concussive education with her. Given the amount of support that she also has at home as she takes care of her husband, I think she is appropriate to return home. If there is any concern for a more gradual change of cognition over time, would consider additional w/u on an outpatient basis.     SLP Assessment  SLP Recommendation/Assessment: Patient does not need any further Speech Lanaguage Pathology Services SLP  Visit Diagnosis: Cognitive communication deficit (R41.841)    Follow Up Recommendations  None (intermittent supervision upon initial return home)    Frequency and Duration           SLP Evaluation Cognition  Overall Cognitive Status: History of cognitive impairments - at baseline       Comprehension  Auditory Comprehension Overall Auditory Comprehension: Appears within functional limits for tasks assessed    Expression Expression Primary Mode of Expression: Verbal   Oral / Motor  Motor Speech Overall Motor Speech: Appears within functional limits for tasks assessed   GO                    Osie Bond., M.A. [facility] Pager [phone] Office [phone]  04/01/2020, 3:33 PM

## 2020-04-01 NOTE — Progress Notes (Signed)
[facility] Progress Note     Subjective: CC-  Feeling well this morning. States that she slept well last night. Denies headache or blurry vision this AM. Some nausea yesterday but this has resolved today. Perplexed because she does not remember falling yesterday. No idea what caused her to fall. States that she has had episodes in the past where she will get light headed if she stands too quickly, but this has not happened in a long time.  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 Currently his brother is staying with him. PCP Dr. Virgina Jock, she has an appointment this month with him.  Objective: Vital signs in last 24 hours: Temp:  [97.2 F (36.2 C)-98.5 F (36.9 C)] 97.7 F (36.5 C) (12/01 0517) Pulse Rate:  [60-86] 60 (12/01 0517) Resp:  [12-23] 17 (12/01 0517) BP: (122-157)/(62-108) 127/62 (12/01 0517) SpO2:  [95 %-100 %] 98 % (12/01 0517) Weight:  [61.2 kg] 61.2 kg (11/30 1534) Last BM Date: 03/31/20  Intake/Output from previous day: 11/30 0701 - 12/01 0700 In: 1262.5 [P.O.:480; I.V.:657.5; IV Piggyback:125] Out: -  Intake/Output this shift: No intake/output data recorded.  PE: Gen:  Alert, NAD, pleasant HEENT: EOM's intact, pupils equal and round and reactive to light. Left forehead hematoma/contusion Card:  RRR, no M/G/R heard, 2+ DP pulses Pulm:  CTAB, no W/R/R, rate and effort normal Abd: Soft, NT/ND, +BS, no HSM Ext:  no BUE/BLE edema, calves soft and nontender Neuro: f/c, no gross motor or sensory deficits  Psych: A&Ox4  Skin: no rashes noted, warm and dry  Lab Results:  Recent Labs    03/31/20 1449 03/31/20 1449 03/31/20 1516 04/01/20 0412  WBC 7.8  --   --  7.2  HGB 12.0   < > 12.6 11.9*  HCT 37.0   < > 37.0 34.1*  PLT 297  --   --  277   < > = values in this interval not displayed.   BMET Recent Labs    03/31/20 1449 03/31/20 1449 03/31/20 1516 04/01/20 0412  NA 130*   < > 131* 132*  K 3.6   < > 3.5 4.0  CL 99    < > 99 100  CO2 20*  --   --  23  GLUCOSE 123*   < > 119* 108*  BUN 17   < > 18 8  CREATININE 0.63   < > 0.50 0.59  CALCIUM 9.1  --   --  9.3   < > = values in this interval not displayed.   PT/INR Recent Labs    03/31/20 1449  LABPROT 12.8  INR 1.0   CMP     Component Value Date/Time   NA 132 (L) 04/01/2020 0412   K 4.0 04/01/2020 0412   CL 100 04/01/2020 0412   CO2 23 04/01/2020 0412   GLUCOSE 108 (H) 04/01/2020 0412   BUN 8 04/01/2020 0412   CREATININE 0.59 04/01/2020 0412   CALCIUM 9.3 04/01/2020 0412   PROT 6.4 (L) 03/31/2020 1449   ALBUMIN 3.9 03/31/2020 1449   AST 18 03/31/2020 1449   ALT 20 03/31/2020 1449   ALKPHOS 66 03/31/2020 1449   BILITOT 0.5 03/31/2020 1449   GFRNONAA >60 04/01/2020 0412   Lipase  No results found for: LIPASE     Studies/Results: CT HEAD WO CONTRAST  Result Date: 03/31/2020 CLINICAL DATA:  Head injury, unwitnessed fall. EXAM: CT HEAD WITHOUT CONTRAST CT CERVICAL SPINE WITHOUT CONTRAST  TECHNIQUE: Multidetector CT imaging of the head and cervical spine was performed following the standard protocol without intravenous contrast. Multiplanar CT image reconstructions of the cervical spine were also generated. COMPARISON:  None. FINDINGS: CT HEAD FINDINGS Brain: No evidence of acute infarction, hemorrhage, hydrocephalus, extra-axial collection or mass lesion/mass effect. Vascular: No hyperdense vessel or unexpected calcification. Skull: Normal. Negative for fracture or focal lesion. Sinuses/Orbits: No acute finding. Other: Large left frontal scalp hematoma is noted. CT CERVICAL SPINE FINDINGS Alignment: Minimal grade 1 anterolisthesis of C4-5 is noted secondary to posterior facet joint hypertrophy. Skull base and vertebrae: No acute fracture. No primary bone lesion or focal pathologic process. Soft tissues and spinal canal: No prevertebral fluid or swelling. No visible canal hematoma. Disc levels:  Moderate degenerative disc disease is noted at  C5-6. Upper chest: Negative. Other: None. IMPRESSION: 1. Large left frontal scalp hematoma. No acute intracranial abnormality seen. 2. Multilevel degenerative disc disease. No acute abnormality seen in the cervical spine. Electronically Signed   By: Marijo Conception M.D.   On: 03/31/2020 15:30   CT CHEST W CONTRAST  Result Date: 03/31/2020 CLINICAL DATA:  79 year old female with chest trauma. EXAM: CT CHEST, ABDOMEN, AND PELVIS WITH CONTRAST TECHNIQUE: Multidetector CT imaging of the chest, abdomen and pelvis was performed following the standard protocol during bolus administration of intravenous contrast. CONTRAST:  13mL OMNIPAQUE IOHEXOL 300 MG/ML  SOLN COMPARISON:  Chest radiograph dated 03/31/2020. FINDINGS: CT CHEST FINDINGS Cardiovascular: There is no cardiomegaly. Small pericardial effusion. Mild noncalcified atherosclerotic plaque of the aorta. No aneurysmal dilatation or dissection. The origins of the great vessels of the aortic arch appear patent as visualized. Mild dilatation of the main portal vein trunk suggestive of a degree of pulmonary hypertension. Clinical correlation is recommended. Mediastinum/Nodes: No hilar or mediastinal adenopathy. There is a small hiatal hernia. The esophagus is grossly unremarkable.There is apparent mild diffuse stranding of the mediastinal fat surrounding the aorta and pulmonary arteries which may represent edema or contusion. No extravascular contrast to suggest active arterial bleed. No fluid collection or hematoma. Lungs/Pleura: Several small scattered pneumatoceles. There is minimal bibasilar dependent atelectasis. No focal consolidation, pleural effusion, pneumothorax. The central airways are patent. Musculoskeletal: Degenerative changes of the spine. No acute osseous pathology. CT ABDOMEN PELVIS FINDINGS There is no intra-abdominal free air or free fluid. Hepatobiliary: Multiple hepatic cysts measure up to 3 cm in the dome of the liver. Several additional  smaller hypodense lesions are too small to characterize. No intrahepatic biliary dilatation. The gallbladder is unremarkable. Pancreas: Unremarkable. No pancreatic ductal dilatation or surrounding inflammatory changes. Spleen: Normal in size without focal abnormality. Adrenals/Urinary Tract: The adrenal glands unremarkable there is no hydronephrosis on either side. There is symmetric enhancement and excretion of contrast by both kidneys. The urinary bladder is unremarkable. Stomach/Bowel: There is redundancy of the sigmoid colon. Several small scattered colonic diverticula without active inflammatory changes. The cecum is located in the left upper abdomen. The ligaments of Treitz is located in the midline suggestive of a degree of malrotation or non rotation. There is apparent protrusion of loops of small bowel and mesentery through a defect within the omentum concerning for an internal hernia. No definite bowel obstruction or volvulus noted. A linear air-fluid structure in the mid abdomen (62/6) most consistent with the appendix and appears unremarkable. Vascular/Lymphatic: The abdominal aorta and IVC unremarkable. Faint low attenuating content within the common femoral veins bilaterally likely artifactual and mixing of contrast. DVT is less likely. Duplex ultrasound may provide  better evaluation if there is high clinical concern for acute DVT. Reproductive: The uterus is not well visualized. No adnexal masses. Other: None Musculoskeletal: Degenerative changes of the spine and hips. No acute osseous pathology. IMPRESSION: 1. Mild diffuse stranding of the mediastinal fat surrounding the aorta and pulmonary arteries may represent edema or contusion. No extravascular contrast to suggest active arterial bleed. No fluid collection or hematoma. No other acute/traumatic intrathoracic, abdominal, or pelvic pathology. 2. 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 No bowel obstruction. Normal appendix. 3.  Mixing artifact versus less likely DVT in the common femoral veins bilaterally. Duplex ultrasound may provide better evaluation if there is high clinical concern for acute DVT. 4. Aortic Atherosclerosis (ICD10-I70.0). Electronically Signed   By: Anner Crete M.D.   On: 03/31/2020 16:02   CT CERVICAL SPINE WO CONTRAST  Result Date: 03/31/2020 CLINICAL DATA:  Head injury, unwitnessed fall. EXAM: CT HEAD WITHOUT CONTRAST CT CERVICAL SPINE WITHOUT CONTRAST TECHNIQUE: Multidetector CT imaging of the head and cervical spine was performed following the standard protocol without intravenous contrast. Multiplanar CT image reconstructions of the cervical spine were also generated. COMPARISON:  None. FINDINGS: CT HEAD FINDINGS Brain: No evidence of acute infarction, hemorrhage, hydrocephalus, extra-axial collection or mass lesion/mass effect. Vascular: No hyperdense vessel or unexpected calcification. Skull: Normal. Negative for fracture or focal lesion. Sinuses/Orbits: No acute finding. Other: Large left frontal scalp hematoma is noted. CT CERVICAL SPINE FINDINGS Alignment: Minimal grade 1 anterolisthesis of C4-5 is noted secondary to posterior facet joint hypertrophy. Skull base and vertebrae: No acute fracture. No primary bone lesion or focal pathologic process. Soft tissues and spinal canal: No prevertebral fluid or swelling. No visible canal hematoma. Disc levels:  Moderate degenerative disc disease is noted at C5-6. Upper chest: Negative. Other: None. IMPRESSION: 1. Large left frontal scalp hematoma. No acute intracranial abnormality seen. 2. Multilevel degenerative disc disease. No acute abnormality seen in the cervical spine. Electronically Signed   By: Marijo Conception M.D.   On: 03/31/2020 15:30   CT ABDOMEN PELVIS W CONTRAST  Result Date: 03/31/2020 CLINICAL DATA:  79 year old female with chest trauma. EXAM: CT CHEST, ABDOMEN, AND PELVIS WITH CONTRAST TECHNIQUE: Multidetector CT imaging of the chest,  abdomen and pelvis was performed following the standard protocol during bolus administration of intravenous contrast. CONTRAST:  13mL OMNIPAQUE IOHEXOL 300 MG/ML  SOLN COMPARISON:  Chest radiograph dated 03/31/2020. FINDINGS: CT CHEST FINDINGS Cardiovascular: There is no cardiomegaly. Small pericardial effusion. Mild noncalcified atherosclerotic plaque of the aorta. No aneurysmal dilatation or dissection. The origins of the great vessels of the aortic arch appear patent as visualized. Mild dilatation of the main portal vein trunk suggestive of a degree of pulmonary hypertension. Clinical correlation is recommended. Mediastinum/Nodes: No hilar or mediastinal adenopathy. There is a small hiatal hernia. The esophagus is grossly unremarkable.There is apparent mild diffuse stranding of the mediastinal fat surrounding the aorta and pulmonary arteries which may represent edema or contusion. No extravascular contrast to suggest active arterial bleed. No fluid collection or hematoma. Lungs/Pleura: Several small scattered pneumatoceles. There is minimal bibasilar dependent atelectasis. No focal consolidation, pleural effusion, pneumothorax. The central airways are patent. Musculoskeletal: Degenerative changes of the spine. No acute osseous pathology. CT ABDOMEN PELVIS FINDINGS There is no intra-abdominal free air or free fluid. Hepatobiliary: Multiple hepatic cysts measure up to 3 cm in the dome of the liver. Several additional smaller hypodense lesions are too small to characterize. No intrahepatic biliary dilatation. The gallbladder is unremarkable. Pancreas: Unremarkable.  No pancreatic ductal dilatation or surrounding inflammatory changes. Spleen: Normal in size without focal abnormality. Adrenals/Urinary Tract: The adrenal glands unremarkable there is no hydronephrosis on either side. There is symmetric enhancement and excretion of contrast by both kidneys. The urinary bladder is unremarkable. Stomach/Bowel: There is  redundancy of the sigmoid colon. Several small scattered colonic diverticula without active inflammatory changes. The cecum is located in the left upper abdomen. The ligaments of Treitz is located in the midline suggestive of a degree of malrotation or non rotation. There is apparent protrusion of loops of small bowel and mesentery through a defect within the omentum concerning for an internal hernia. No definite bowel obstruction or volvulus noted. A linear air-fluid structure in the mid abdomen (62/6) most consistent with the appendix and appears unremarkable. Vascular/Lymphatic: The abdominal aorta and IVC unremarkable. Faint low attenuating content within the common femoral veins bilaterally likely artifactual and mixing of contrast. DVT is less likely. Duplex ultrasound may provide better evaluation if there is high clinical concern for acute DVT. Reproductive: The uterus is not well visualized. No adnexal masses. Other: None Musculoskeletal: Degenerative changes of the spine and hips. No acute osseous pathology. IMPRESSION: 1. Mild diffuse stranding of the mediastinal fat surrounding the aorta and pulmonary arteries may represent edema or contusion. No extravascular contrast to suggest active arterial bleed. No fluid collection or hematoma. No other acute/traumatic intrathoracic, abdominal, or pelvic pathology. 2. 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 No bowel obstruction. Normal appendix. 3. Mixing artifact versus less likely DVT in the common femoral veins bilaterally. Duplex ultrasound may provide better evaluation if there is high clinical concern for acute DVT. 4. Aortic Atherosclerosis (ICD10-I70.0). Electronically Signed   By: Anner Crete M.D.   On: 03/31/2020 16:02   DG Chest Port 1 View  Result Date: 03/31/2020 CLINICAL DATA:  Recent fall, possible pneumothorax EXAM: PORTABLE CHEST 1 VIEW COMPARISON:  None. FINDINGS: The heart size and mediastinal contours are within  normal limits. Both lungs are clear. The visualized skeletal structures are unremarkable. IMPRESSION: No active disease. Electronically Signed   By: Randa Ngo M.D.   On: 03/31/2020 15:03    Anti-infectives: Anti-infectives (From admission, onward)   None       Assessment/Plan Presumed GLF outside store Scalp hematoma and concussion - TBI team therapies Congenital bowel malrotation - asymptomatic  ID - none FEN - KVO IVF, reg diet VTE - SCDs, lovenox Foley - none  Plan - TBI team therapies. Will discuss with hospitalist whether or not syncopal work up would be recommended inpatient vs outpatient since we do not know what caused her to fall.  Patient hopeful for discharge later today.   LOS: 0 days    Wellington Hampshire, [facility] 04/01/2020, 8:12 AM Please see Amion for pager number during day hours 7:00am-4:30pm

## 2020-04-02 ENCOUNTER — Encounter (HOSPITAL_COMMUNITY): Payer: Self-pay | Admitting: Surgery

## 2020-04-08 ENCOUNTER — Telehealth: Payer: Self-pay | Admitting: General Practice

## 2020-04-08 NOTE — Telephone Encounter (Signed)
Tammy [name] is calling stating she was advised our office would contact her in regards to a heart monitor, but she has not heard anything yet. Please advise.

## 2020-04-08 NOTE — Telephone Encounter (Signed)
Patient enrolled 04/08/2020 for [employer] to ship a 30 day cardiac event monitor to her home.  Instructions sent to patient via My Chart.  Contact numbers for [employer] and [facility] given.

## 2020-04-10 DIAGNOSIS — S060X9D Concussion with loss of consciousness of unspecified duration, subsequent encounter: Secondary | ICD-10-CM | POA: Diagnosis not present

## 2020-04-10 DIAGNOSIS — E871 Hypo-osmolality and hyponatremia: Secondary | ICD-10-CM | POA: Diagnosis not present

## 2020-04-10 DIAGNOSIS — I700 Atherosclerosis of aorta: Secondary | ICD-10-CM | POA: Diagnosis not present

## 2020-04-10 DIAGNOSIS — M79672 Pain in left foot: Secondary | ICD-10-CM | POA: Diagnosis not present

## 2020-04-10 DIAGNOSIS — R6889 Other general symptoms and signs: Secondary | ICD-10-CM | POA: Diagnosis not present

## 2020-04-10 DIAGNOSIS — S0003XD Contusion of scalp, subsequent encounter: Secondary | ICD-10-CM | POA: Diagnosis not present

## 2020-04-14 ENCOUNTER — Ambulatory Visit (INDEPENDENT_AMBULATORY_CARE_PROVIDER_SITE_OTHER): Payer: Medicare HMO

## 2020-04-14 ENCOUNTER — Encounter: Payer: Self-pay | Admitting: Cardiology

## 2020-04-14 DIAGNOSIS — R55 Syncope and collapse: Secondary | ICD-10-CM | POA: Diagnosis not present

## 2020-05-04 ENCOUNTER — Telehealth: Payer: Self-pay

## 2020-05-04 NOTE — Telephone Encounter (Addendum)
Received a Serious Cardiac Report -Day 18 from [employer]  Patient trigger 05/01/20 at 0709 am Patient symptoms: SOB, Fast HR Report Analysis: SR with PSVT/Artifact  Called the patient to discuss and left a message to call the office back.  Monitor reviewed by DOD, Dr. Delton See who would like patient to end monitor enrollment at this time and send back her monitor so that it can be reviewed. Dr. Delton See recommends scheduling first available appointment.   Awaiting patients return call to discuss above recommendations.  Appointment with Dr. Shari Prows has been made for 06/02/20 at 320pm.   Spoke with the patient who reports that she does not remember triggering her monitor. She has been asymptomatic despite some lightheadedness, she reports anxiety over the last week. She aware to send the monitor back now, before her 30 days are up per Dr. Delton See and will plan to see Dr. Shari Prows as a new patient on 06/02/20.  Patient made aware to call back with any new concerns.

## 2020-05-04 NOTE — Telephone Encounter (Signed)
Patient returning Sheleigh's phone call, connected call to [facility].

## 2020-05-26 ENCOUNTER — Ambulatory Visit: Payer: Medicare HMO | Admitting: Cardiology

## 2020-05-30 NOTE — Progress Notes (Signed)
Cardiology Office Note:    Date:  06/02/2020   ID:  Tammy [name], DOB [birth year], MRN [id number]  PCP:  Shon Baton, MD  [facility] Cardiologist:  Freada Bergeron, MD  [facility] Electrophysiologist:  None   Referring MD: Shon Baton, MD    History of Present Illness:    Tammy [name] is a 80 y.o. female with a hx of hypothyroidism and recent hospitalization after being found down with concern for possible syncope who was referred by Dr. Virgina Jock for further management of syncope.  Patient was brought into [facility] on 03/31/30 for signs of head trauma after being found down [facility]. On presentation there, she was notably confused with a scalp hematoma. She does not recall tripping or falling or any presyncopal symptoms. CT head confirmed scalp hematoma but fortunately no other acute pathology. Cardiac monitor from 04/14/20-05/07/20 revealed NSR with 1 episode of SVT lasting 23 seconds which was symptomatic. She now presents to clinic for further management.  Today, the patient states that she has been doing overall okay since being in the ED. She has had no further episodes of syncope. She states she has no recollection of the event and cannot remember how she fell, although she has a tendency to lose her footing so she thinks she tripped and fell and hit her head. Has very remote history of fainting in a hot church when a teenager but no events since that time. No known seizure disorders.  No chest pain, SOB, orthopnea, palpitations, lightheadedness, dizziness, or LE edema. Prior to this episode, she was doing overall well and was active without exertional symptoms.  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 She would like to avoid additional medications or invasive procedures if possible.  Past Medical History:  Diagnosis Date  . Hypothyroidism   . Skin lesion of neck    Anterior Neck (Thyroid Tissue)  . Thyroid disease     Past  Surgical History:  Procedure Laterality Date  . ANAL FISSURE REPAIR    . broken elbow     pins  . ELBOW SURGERY Right   . EYE SURGERY     bilateral cataracts   . FOOT SURGERY Left   . LESION REMOVAL N/A 10/11/2017   Procedure: EXCISION ANTERIOR NECK SKIN LESION ERAS PATHWAY;  Surgeon: Coralie Keens, MD;  Location: [place];  Service: General;  Laterality: N/A;  . needle removed from foot    . THYROID SURGERY    . TONSILLECTOMY    . TUBAL LIGATION      Current Medications: Current Meds  Medication Sig  . ascorbic acid (VITAMIN C) 500 MG tablet Take 500 mg by mouth daily.  . Biotin w/ Vitamins C & E (HAIR/SKIN/NAILS PO) Take 1 tablet by mouth daily.  . Cholecalciferol (VITAMIN D3) 2000 units TABS Take 2,000 Units by mouth 2 (two) times a week.  Marland Kitchen EPINEPHrine 0.3 mg/0.3 mL IJ SOAJ injection Inject 0.3 mg into the muscle daily as needed (for anaphylatic reactions (BEE STINGS)). For bee stings  . levothyroxine (SYNTHROID, LEVOTHROID) 125 MCG tablet Take 125 mcg by mouth daily before breakfast. Per patient taking 1/2 tablet on Wednesday and Saturday.  . Magnesium 500 MG TABS Take 500 mg by mouth at bedtime.  . magnesium oxide (MAG-OX) 400 MG tablet Take 400 mg by mouth at bedtime.  . Multiple Vitamin (MULTIVITAMIN WITH MINERALS) TABS tablet Take 1 tablet by mouth once a week.  Marland Kitchen  SYNTHROID 125 MCG tablet Take 125 mcg by mouth daily.     Allergies:   Bee venom   Social History   Socioeconomic History  . Marital status: Married    Spouse name: Not on file  . Number of children: Not on file  . Years of education: Not on file  . Highest education level: Not on file  Occupational History  . Not on file  Tobacco Use  . Smoking status: Former Research scientist (life sciences)  . Smokeless tobacco: Never Used  Vaping Use  . Vaping Use: Never used  Substance and Sexual Activity  . Alcohol use: Never  . Drug use: Never  . Sexual activity: Not on file  Other Topics Concern  . Not on file  Social History  Narrative   ** Merged History Encounter **       Social Determinants of Health   Financial Resource Strain: Not on file  Food Insecurity: Not on file  Transportation Needs: Not on file  Physical Activity: Not on file  Stress: Not on file  Social Connections: Not on file     Family History: The patient's family history includes Heart disease in her father; Stomach cancer in her paternal grandmother. There is no history of Colon cancer or Pancreatic cancer.  ROS:   Please see the history of present illness.    Review of Systems  Constitutional: Negative for chills and fever.  HENT: Negative for nosebleeds.   Eyes: Negative for blurred vision and photophobia.  Respiratory: Negative for shortness of breath.   Cardiovascular: Negative for chest pain, palpitations, orthopnea, claudication, leg swelling and PND.  Gastrointestinal: Negative for melena, nausea and vomiting.  Genitourinary: Negative for dysuria and hematuria.  Musculoskeletal: Positive for falls and myalgias.  Neurological: Positive for loss of consciousness. Negative for dizziness, focal weakness and seizures.  Endo/Heme/Allergies: Negative for polydipsia.  Psychiatric/Behavioral: Negative for substance abuse. The patient is nervous/anxious.     EKGs/Labs/Other Studies Reviewed:    The following studies were reviewed today: CT abd/pelvis 03/31/20: IMPRESSION: 1. Mild diffuse stranding of the mediastinal fat surrounding the aorta and pulmonary arteries may represent edema or contusion. No extravascular contrast to suggest active arterial bleed. No fluid collection or hematoma. No other acute/traumatic intrathoracic, abdominal, or pelvic pathology. 2. 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 No bowel obstruction. Normal appendix. 3. Mixing artifact versus less likely DVT in the common femoral veins bilaterally. Duplex ultrasound may provide better evaluation if there is high clinical concern for  acute DVT. 4. Aortic Atherosclerosis (ICD10-I70.0).  Cardiac Monitor 05/13/20:  Patient's monitoring period was from 04/14/2020-05/07/2020  Predominant rhythm was normal sinus rhythm with HR 63bpm with minimum HR 53bpm and maximum HR 147bpm  There was one episode of SVT lasting 23 seconds that correlated with patient triggered event, but no other significant arrhythmias  Rare PVCs, rare PACs  No atrial fibrillation, VT or significant pauses.    Recent Labs: 03/31/2020: ALT 20 04/01/2020: BUN 8; Creatinine, Ser 0.59; Hemoglobin 11.9; Platelets 277; Potassium 4.0; Sodium 132  Recent Lipid Panel No results found for: CHOL, TRIG, HDL, CHOLHDL, VLDL, LDLCALC, LDLDIRECT   Physical Exam:    VS:  BP 90/64   Pulse 73   Ht 5' 3.5" (1.613 m)   Wt 132 lb 3.2 oz (60 kg)   SpO2 98%   BMI 23.05 kg/m     Wt Readings from Last 3 Encounters:  06/02/20 132 lb 3.2 oz (60 kg)  03/31/20 135 lb (61.2 kg)  10/11/17 128 lb 11.2 oz (58.4 kg)     GEN:  Well nourished, well developed in no acute distress HEENT: Normal NECK: No JVD; No carotid bruits CARDIAC: RRR, no murmurs, rubs, gallops RESPIRATORY:  Clear to auscultation without rales, wheezing or rhonchi  ABDOMEN: Soft, non-tender, non-distended MUSCULOSKELETAL:  No edema; No deformity  SKIN: Warm and dry NEUROLOGIC:  Alert and oriented x 3 PSYCHIATRIC:  Normal affect   ASSESSMENT:    1. Syncope, unspecified syncope type   2. Elevated LDL cholesterol level    PLAN:    In order of problems listed above:  #Fall with Headstrike #Possible Syncope: Patient with presentation to ED in 03/2020 after being found down with head hematoma at [facility]. Unclear etiology of event as patient has no recollection of what happened. Denied any preceding chest pain, palpitations, SOB, nausea or diaphoresis. Has a history of tripping when walking and she thinks she may have stumbled and hit her head. No known seizure disorders. Has very remote  history of fainting when standing in a warm church as a teen but nothing since that time. No known history of CAD and patient denies any exertional symptoms. Monitor with episode of SVT that she states occurred during a panic attack. Had some mild lightheadedness at that time but she thinks that was related to anxiety. No other arrhythmias on monitor -Cardiac monitor with one episode of SVT but no Afib, VT, pauses. Unlikely SVT was cause of syncope as only going 147bpm -Check TTE -Check coronary calcium score and if significantly elevated, can pursue ischemic work-up at that time; patient would like to stay as conservative as possible with treatment and work-up -Declined BB for SVT and blood pressure is soft; will continue to monitor for palpitations -Maintain adequate hydration as blood pressure is on the lower side -Recommend compression socks and regular meals with salt in case orthostasis played a role  #Elevated LDL: LDL 110, however, HDL 105.  -Check coronary calcium score as above and determine if needs statin therapy    Medication Adjustments/Labs and Tests Ordered: Current medicines are reviewed at length with the patient today.  Concerns regarding medicines are outlined above.  Orders Placed This Encounter  Procedures  . CT CARDIAC SCORING (SELF PAY ONLY)  . ECHOCARDIOGRAM COMPLETE   No orders of the defined types were placed in this encounter.   Patient Instructions  Medication Instructions:  Your physician recommends that you continue on your current medications as directed. Please refer to the Current Medication list given to you today.  *If you need a refill on your cardiac medications before your next appointment, please call your pharmacy*   Lab Work: none If you have labs (blood work) drawn today and your tests are completely normal, you will receive your results only by: Marland Kitchen MyChart Message (if you have MyChart) OR . A paper copy in the mail If you have any lab test  that is abnormal or we need to change your treatment, we will call you to review the results.   Testing/Procedures: Your physician has requested that you have an echocardiogram. Echocardiography is a painless test that uses sound waves to create images of your heart. It provides your doctor with information about the size and shape of your heart and how well your heart's chambers and valves are working. This procedure takes approximately one hour. There are no restrictions for this procedure.  Dr Johney Frame would like you to have a Calcium Score CT Scan   Follow-Up:  At [facility], you and your health needs are our priority.  As part of our continuing mission to provide you with exceptional heart care, we have created designated Provider Care Teams.  These Care Teams include your primary Cardiologist (physician) and Advanced Practice Providers (APPs -  Physician Assistants and Nurse Practitioners) who all work together to provide you with the care you need, when you need it.  We recommend signing up for the patient portal called "MyChart".  Sign up information is provided on this After Visit Summary.  MyChart is used to connect with patients for Virtual Visits (Telemedicine).  Patients are able to view lab/test results, encounter notes, upcoming appointments, etc.  Non-urgent messages can be sent to your provider as well.   To learn more about what you can do with MyChart, go to [email or web address].    Your next appointment:   6 month(s)  The format for your next appointment:   In Person  Provider:   You may see Freada Bergeron, MD or one of the following Advanced Practice Providers on your designated Care Team:    Richardson Dopp, PA-C  Robbie Lis, [place]    Other Instructions      Signed, Freada Bergeron, MD  06/02/2020 3:56 PM    [place]

## 2020-06-02 ENCOUNTER — Ambulatory Visit: Payer: Medicare HMO | Admitting: Cardiology

## 2020-06-02 ENCOUNTER — Encounter: Payer: Self-pay | Admitting: Cardiology

## 2020-06-02 ENCOUNTER — Other Ambulatory Visit: Payer: Self-pay

## 2020-06-02 VITALS — BP 90/64 | HR 73 | Ht 63.5 in | Wt 132.2 lb

## 2020-06-02 DIAGNOSIS — E7800 Pure hypercholesterolemia, unspecified: Secondary | ICD-10-CM | POA: Diagnosis not present

## 2020-06-02 DIAGNOSIS — R55 Syncope and collapse: Secondary | ICD-10-CM | POA: Diagnosis not present

## 2020-06-02 NOTE — Patient Instructions (Signed)
Medication Instructions:  Your physician recommends that you continue on your current medications as directed. Please refer to the Current Medication list given to you today.  *If you need a refill on your cardiac medications before your next appointment, please call your pharmacy*   Lab Work: none If you have labs (blood work) drawn today and your tests are completely normal, you will receive your results only by: Marland Kitchen MyChart Message (if you have MyChart) OR . A paper copy in the mail If you have any lab test that is abnormal or we need to change your treatment, we will call you to review the results.   Testing/Procedures: Your physician has requested that you have an echocardiogram. Echocardiography is a painless test that uses sound waves to create images of your heart. It provides your doctor with information about the size and shape of your heart and how well your heart's chambers and valves are working. This procedure takes approximately one hour. There are no restrictions for this procedure.  Dr Johney Frame would like you to have a Calcium Score CT Scan   Follow-Up: At [facility], you and your health needs are our priority.  As part of our continuing mission to provide you with exceptional heart care, we have created designated Provider Care Teams.  These Care Teams include your primary Cardiologist (physician) and Advanced Practice Providers (APPs -  Physician Assistants and Nurse Practitioners) who all work together to provide you with the care you need, when you need it.  We recommend signing up for the patient portal called "MyChart".  Sign up information is provided on this After Visit Summary.  MyChart is used to connect with patients for Virtual Visits (Telemedicine).  Patients are able to view lab/test results, encounter notes, upcoming appointments, etc.  Non-urgent messages can be sent to your provider as well.   To learn more about what you can do with MyChart, go to  [email or web address].    Your next appointment:   6 month(s)  The format for your next appointment:   In Person  Provider:   You may see Freada Bergeron, MD or one of the following Advanced Practice Providers on your designated Care Team:    Richardson Dopp, PA-C  Robbie Lis, [place]    Other Instructions

## 2020-06-09 DIAGNOSIS — Z0000 Encounter for general adult medical examination without abnormal findings: Secondary | ICD-10-CM | POA: Diagnosis not present

## 2020-06-09 DIAGNOSIS — E039 Hypothyroidism, unspecified: Secondary | ICD-10-CM | POA: Diagnosis not present

## 2020-06-09 DIAGNOSIS — E871 Hypo-osmolality and hyponatremia: Secondary | ICD-10-CM | POA: Diagnosis not present

## 2020-06-09 DIAGNOSIS — E875 Hyperkalemia: Secondary | ICD-10-CM | POA: Diagnosis not present

## 2020-06-09 DIAGNOSIS — E559 Vitamin D deficiency, unspecified: Secondary | ICD-10-CM | POA: Diagnosis not present

## 2020-06-16 DIAGNOSIS — I700 Atherosclerosis of aorta: Secondary | ICD-10-CM | POA: Diagnosis not present

## 2020-06-16 DIAGNOSIS — E559 Vitamin D deficiency, unspecified: Secondary | ICD-10-CM | POA: Diagnosis not present

## 2020-06-16 DIAGNOSIS — E039 Hypothyroidism, unspecified: Secondary | ICD-10-CM | POA: Diagnosis not present

## 2020-06-16 DIAGNOSIS — Z0000 Encounter for general adult medical examination without abnormal findings: Secondary | ICD-10-CM | POA: Diagnosis not present

## 2020-06-16 DIAGNOSIS — E890 Postprocedural hypothyroidism: Secondary | ICD-10-CM | POA: Diagnosis not present

## 2020-06-16 DIAGNOSIS — E785 Hyperlipidemia, unspecified: Secondary | ICD-10-CM | POA: Diagnosis not present

## 2020-06-16 DIAGNOSIS — F418 Other specified anxiety disorders: Secondary | ICD-10-CM | POA: Diagnosis not present

## 2020-06-16 DIAGNOSIS — R6889 Other general symptoms and signs: Secondary | ICD-10-CM | POA: Diagnosis not present

## 2020-06-16 DIAGNOSIS — M810 Age-related osteoporosis without current pathological fracture: Secondary | ICD-10-CM | POA: Diagnosis not present

## 2020-06-23 ENCOUNTER — Inpatient Hospital Stay: Admission: RE | Admit: 2020-06-23 | Payer: Self-pay | Source: Ambulatory Visit

## 2020-06-23 ENCOUNTER — Other Ambulatory Visit (HOSPITAL_COMMUNITY): Payer: Medicare HMO

## 2020-06-23 ENCOUNTER — Telehealth (HOSPITAL_COMMUNITY): Payer: Self-pay | Admitting: Cardiology

## 2020-06-23 NOTE — Telephone Encounter (Signed)
Patient called and cancelled Echocardiogram and CT due to she was unable to get here due to issues with Transportation and the person helping was sick. She will call me back when she is able to reschedule these appointments.  Order will be removed from the [place] and when pt calls back we will reinstate the order. Thank you.

## 2020-08-18 ENCOUNTER — Ambulatory Visit (INDEPENDENT_AMBULATORY_CARE_PROVIDER_SITE_OTHER)
Admission: RE | Admit: 2020-08-18 | Discharge: 2020-08-18 | Disposition: A | Discharge status: Home / Self Care | Payer: Self-pay | Source: Ambulatory Visit | Attending: Cardiology | Admitting: Cardiology

## 2020-08-18 ENCOUNTER — Other Ambulatory Visit: Payer: Self-pay

## 2020-08-18 DIAGNOSIS — R55 Syncope and collapse: Secondary | ICD-10-CM

## 2020-08-18 DIAGNOSIS — Z1231 Encounter for screening mammogram for malignant neoplasm of breast: Secondary | ICD-10-CM | POA: Diagnosis not present

## 2020-08-25 ENCOUNTER — Other Ambulatory Visit: Payer: Self-pay

## 2020-08-25 ENCOUNTER — Ambulatory Visit (HOSPITAL_COMMUNITY): Payer: Medicare HMO | Attending: Cardiology

## 2020-08-25 DIAGNOSIS — R55 Syncope and collapse: Secondary | ICD-10-CM | POA: Insufficient documentation

## 2020-08-25 LAB — ECHOCARDIOGRAM COMPLETE
Area-P 1/2: 2.96 cm2
S' Lateral: 2.8 cm

## 2020-09-15 DIAGNOSIS — R922 Inconclusive mammogram: Secondary | ICD-10-CM | POA: Diagnosis not present

## 2020-09-15 DIAGNOSIS — R928 Other abnormal and inconclusive findings on diagnostic imaging of breast: Secondary | ICD-10-CM | POA: Diagnosis not present

## 2020-12-31 DIAGNOSIS — L989 Disorder of the skin and subcutaneous tissue, unspecified: Secondary | ICD-10-CM | POA: Diagnosis not present

## 2020-12-31 DIAGNOSIS — E871 Hypo-osmolality and hyponatremia: Secondary | ICD-10-CM | POA: Diagnosis not present

## 2020-12-31 DIAGNOSIS — I700 Atherosclerosis of aorta: Secondary | ICD-10-CM | POA: Diagnosis not present

## 2020-12-31 DIAGNOSIS — E785 Hyperlipidemia, unspecified: Secondary | ICD-10-CM | POA: Diagnosis not present

## 2020-12-31 DIAGNOSIS — F418 Other specified anxiety disorders: Secondary | ICD-10-CM | POA: Diagnosis not present

## 2020-12-31 DIAGNOSIS — E039 Hypothyroidism, unspecified: Secondary | ICD-10-CM | POA: Diagnosis not present

## 2020-12-31 DIAGNOSIS — C73 Malignant neoplasm of thyroid gland: Secondary | ICD-10-CM | POA: Diagnosis not present

## 2020-12-31 DIAGNOSIS — R6889 Other general symptoms and signs: Secondary | ICD-10-CM | POA: Diagnosis not present

## 2020-12-31 DIAGNOSIS — M810 Age-related osteoporosis without current pathological fracture: Secondary | ICD-10-CM | POA: Diagnosis not present

## 2021-01-19 ENCOUNTER — Emergency Department (HOSPITAL_COMMUNITY): Payer: Medicare HMO

## 2021-01-19 ENCOUNTER — Other Ambulatory Visit: Payer: Self-pay

## 2021-01-19 ENCOUNTER — Encounter (HOSPITAL_COMMUNITY): Payer: Self-pay

## 2021-01-19 ENCOUNTER — Emergency Department (HOSPITAL_COMMUNITY)
Admission: EM | Admit: 2021-01-19 | Discharge: 2021-01-19 | Disposition: A | Discharge status: Home / Self Care | Payer: Medicare HMO | Attending: Emergency Medicine | Admitting: Emergency Medicine

## 2021-01-19 DIAGNOSIS — R0902 Hypoxemia: Secondary | ICD-10-CM | POA: Diagnosis not present

## 2021-01-19 DIAGNOSIS — R0602 Shortness of breath: Secondary | ICD-10-CM | POA: Diagnosis not present

## 2021-01-19 DIAGNOSIS — E039 Hypothyroidism, unspecified: Secondary | ICD-10-CM | POA: Insufficient documentation

## 2021-01-19 DIAGNOSIS — I10 Essential (primary) hypertension: Secondary | ICD-10-CM | POA: Diagnosis not present

## 2021-01-19 DIAGNOSIS — R0789 Other chest pain: Secondary | ICD-10-CM | POA: Insufficient documentation

## 2021-01-19 DIAGNOSIS — R079 Chest pain, unspecified: Secondary | ICD-10-CM | POA: Diagnosis not present

## 2021-01-19 DIAGNOSIS — Z87891 Personal history of nicotine dependence: Secondary | ICD-10-CM | POA: Diagnosis not present

## 2021-01-19 DIAGNOSIS — R000 Tachycardia, unspecified: Secondary | ICD-10-CM | POA: Diagnosis not present

## 2021-01-19 DIAGNOSIS — Z79899 Other long term (current) drug therapy: Secondary | ICD-10-CM | POA: Insufficient documentation

## 2021-01-19 DIAGNOSIS — R002 Palpitations: Secondary | ICD-10-CM | POA: Insufficient documentation

## 2021-01-19 LAB — CBC WITH DIFFERENTIAL/PLATELET
Abs Immature Granulocytes: 0.02 10*3/uL (ref 0.00–0.07)
Basophils Absolute: 0.1 10*3/uL (ref 0.0–0.1)
Basophils Relative: 1 %
Eosinophils Absolute: 0.1 10*3/uL (ref 0.0–0.5)
Eosinophils Relative: 2 %
HCT: 39 % (ref 36.0–46.0)
Hemoglobin: 13.1 g/dL (ref 12.0–15.0)
Immature Granulocytes: 0 %
Lymphocytes Relative: 30 %
Lymphs Abs: 2.1 10*3/uL (ref 0.7–4.0)
MCH: 31 pg (ref 26.0–34.0)
MCHC: 33.6 g/dL (ref 30.0–36.0)
MCV: 92.4 fL (ref 80.0–100.0)
Monocytes Absolute: 0.7 10*3/uL (ref 0.1–1.0)
Monocytes Relative: 9 %
Neutro Abs: 4 10*3/uL (ref 1.7–7.7)
Neutrophils Relative %: 58 %
Platelets: 326 10*3/uL (ref 150–400)
RBC: 4.22 MIL/uL (ref 3.87–5.11)
RDW: 13.2 % (ref 11.5–15.5)
WBC: 7 10*3/uL (ref 4.0–10.5)
nRBC: 0 % (ref 0.0–0.2)

## 2021-01-19 LAB — COMPREHENSIVE METABOLIC PANEL WITH GFR
ALT: 25 U/L (ref 0–44)
AST: 21 U/L (ref 15–41)
Albumin: 3.9 g/dL (ref 3.5–5.0)
Alkaline Phosphatase: 82 U/L (ref 38–126)
Anion gap: 8 (ref 5–15)
BUN: 24 mg/dL — ABNORMAL HIGH (ref 8–23)
CO2: 24 mmol/L (ref 22–32)
Calcium: 9.5 mg/dL (ref 8.9–10.3)
Chloride: 101 mmol/L (ref 98–111)
Creatinine, Ser: 0.71 mg/dL (ref 0.44–1.00)
GFR, Estimated: >60 mL/min
Glucose, Bld: 164 mg/dL — ABNORMAL HIGH (ref 70–99)
Potassium: 4.2 mmol/L (ref 3.5–5.1)
Sodium: 133 mmol/L — ABNORMAL LOW (ref 135–145)
Total Bilirubin: 0.4 mg/dL (ref 0.3–1.2)
Total Protein: 6.3 g/dL — ABNORMAL LOW (ref 6.5–8.1)

## 2021-01-19 LAB — TROPONIN I (HIGH SENSITIVITY): Troponin I (High Sensitivity): 9 ng/L

## 2021-01-19 NOTE — ED Triage Notes (Signed)
Pt states she had a salty dinner and s/p dinner she felt that she was having palpitations, when she was getting her husband in bed with hoyer lift she felt lightheaded and short of breath with palpitations and called EMS, Upon ems arrival Pt in Atrial Fib RVR at 150's, given 20 mg Cardizem by EMS enroute to hospital.  Presenting currently in atrial fibrillation in the 80's

## 2021-01-19 NOTE — ED Provider Notes (Signed)
[facility] EMERGENCY DEPARTMENT Provider Note   CSN: [id number] Arrival date & time: 01/19/21  2123     History Chief Complaint  Patient presents with   Palpitations    Tammy [name] is a 80 y.o. female.  80 year old female with prior medical history as detailed below presents for evaluation.  Patient reports that she developed vague chest discomfort with associated mild palpitations this evening.  Symptoms began around 7.  Symptoms lasted until EMS arrived.  EMS reports that the patient's heart rate was into the 150's.  EMS administered 20 mg of Cardizem.  Upon my evaluation the patient is comfortable and without active complaint.  The history is provided by the patient.  Palpitations Palpitations quality:  Regular Onset quality:  Sudden Duration:  4 hours Timing:  Constant Progression:  Resolved Chronicity:  New Context: anxiety       Past Medical History:  Diagnosis Date   Hypothyroidism    Skin lesion of neck    Anterior Neck (Thyroid Tissue)   Thyroid disease     Patient Active Problem List   Diagnosis Date Noted   Concussion 03/31/2020    Past Surgical History:  Procedure Laterality Date   ANAL FISSURE REPAIR     broken elbow     pins   ELBOW SURGERY Right    EYE SURGERY     bilateral cataracts    FOOT SURGERY Left    LESION REMOVAL N/A 10/11/2017   Procedure: EXCISION ANTERIOR NECK SKIN LESION ERAS PATHWAY;  Surgeon: Coralie Keens, MD;  Location: [place];  Service: General;  Laterality: N/A;   needle removed from foot     [place]       OB History   No obstetric history on file.     Family History  Problem Relation Age of Onset   Heart disease Father    Stomach cancer Paternal Grandmother    Colon cancer Neg Hx    Pancreatic cancer Neg Hx     Social History   Tobacco Use   Smoking status: Former   Smokeless tobacco: Never  Scientific laboratory technician Use: Never used   Substance Use Topics   Alcohol use: Never   Drug use: Never    Home Medications Prior to Admission medications   Medication Sig Start Date End Date Taking? Authorizing Provider  ascorbic acid (VITAMIN C) 500 MG tablet Take 500 mg by mouth daily.    [provider]  Biotin w/ Vitamins C & E (HAIR/SKIN/NAILS PO) Take 1 tablet by mouth daily.    [provider]  Cholecalciferol (VITAMIN D3) 2000 units TABS Take 2,000 Units by mouth 2 (two) times a week.    [provider]  EPINEPHrine 0.3 mg/0.3 mL IJ SOAJ injection Inject 0.3 mg into the muscle daily as needed (for anaphylatic reactions (BEE STINGS)). For bee stings    [provider]  levothyroxine (SYNTHROID, LEVOTHROID) 125 MCG tablet Take 125 mcg by mouth daily before breakfast. Per patient taking 1/2 tablet on Wednesday and Saturday.    [provider]  Magnesium 500 MG TABS Take 500 mg by mouth at bedtime.    [provider]  magnesium oxide (MAG-OX) 400 MG tablet Take 400 mg by mouth at bedtime.    [provider]  Multiple Vitamin (MULTIVITAMIN WITH MINERALS) TABS tablet Take 1 tablet by mouth once a week.    [provider]  SYNTHROID 125 MCG tablet Take 125 mcg by mouth daily. 02/11/20   [provider]    Allergies    Bee venom  Review of Systems   Review of Systems  Cardiovascular:  Positive for palpitations.  All other systems reviewed and are negative.  Physical Exam Updated Vital Signs BP 137/81 (BP Location: Right Arm)   Pulse 85   Temp 98.1 F (36.7 C) (Oral)   Resp 18   Ht 5\' 4"  (1.626 m)   Wt 63.5 kg   SpO2 100%   BMI 24.03 kg/m   Physical Exam Vitals and nursing note reviewed.  Constitutional:      General: She is not in acute distress.    Appearance: Normal appearance. She is well-developed.  HENT:     Head: Normocephalic and atraumatic.  Eyes:     Conjunctiva/sclera: Conjunctivae normal.     Pupils: Pupils are equal,  round, and reactive to light.  Cardiovascular:     Rate and Rhythm: Normal rate and regular rhythm.     Heart sounds: Normal heart sounds.  Pulmonary:     Effort: Pulmonary effort is normal. No respiratory distress.     Breath sounds: Normal breath sounds.  Abdominal:     General: There is no distension.     Palpations: Abdomen is soft.     Tenderness: There is no abdominal tenderness.  Musculoskeletal:        General: No deformity. Normal range of motion.     Cervical back: Normal range of motion and neck supple.  Skin:    General: Skin is warm and dry.  Neurological:     General: No focal deficit present.     Mental Status: She is alert and oriented to person, place, and time.    ED Results / Procedures / Treatments   Labs (all labs ordered are listed, but only abnormal results are displayed) Labs Reviewed  CBC WITH DIFFERENTIAL/PLATELET  COMPREHENSIVE METABOLIC PANEL  TROPONIN I (HIGH SENSITIVITY)    EKG EKG Interpretation  Date/Time:  Tuesday January 19 2021 21:31:23 EDT Ventricular Rate:  83 PR Interval:  150 QRS Duration: 100 QT Interval:  366 QTC Calculation: 430 R Axis:   72 Text Interpretation: Sinus rhythm Confirmed by Dene Gentry [phone]) on 01/19/2021 9:47:41 PM  Radiology DG Chest Port 1 View  Result Date: 01/19/2021 CLINICAL DATA:  Chest pain and palpitations.  Shortness of breath. EXAM: PORTABLE CHEST 1 VIEW COMPARISON:  10/05/2017 FINDINGS: The heart size and mediastinal contours are within normal limits. Both lungs are clear. The visualized skeletal structures are unremarkable. IMPRESSION: No active disease. Electronically Signed   By: Lucienne Capers M.D.   On: 01/19/2021 22:05    Procedures Procedures   Medications Ordered in ED Medications - No data to display  ED Course  I have reviewed the triage vital signs and the nursing notes.  Pertinent labs & imaging results that were available during my care of the patient were reviewed by me  and considered in my medical decision making (see chart for details).    MDM Rules/Calculators/A&P                           MDM  MSE complete  Tammy [name] was evaluated in Emergency Department on 01/19/2021 for the symptoms described in the history of present illness. She was evaluated in the context of the global COVID-19 pandemic, which necessitated consideration that the  patient might be at risk for infection with the SARS-CoV-2 virus that causes COVID-19. Institutional protocols and algorithms that pertain to the evaluation of patients at risk for COVID-19 are in a state of rapid change based on information released by regulatory bodies including the CDC and federal and state organizations. These policies and algorithms were followed during the patient's care in the ED.  Patient presents with complaint of vague chest discomfort with associated tachycardia.  Patient reports significant anxiety at the time of her symptoms.  EMS administered 20 mg of Cardizem prior to arrival.  Heart rate on arrival is in the 80s.  Patient is asymptomatic.  EKG on arrival is without evidence of atrial fibrillation or other arrhythmia.  Given patient's symptoms we will obtain screening labs including troponin.  Chart review demonstrates Echo obtained in April 2022 which showed normal LV function.  Echo was also suggestive of mildly dilated LA and mildly thickened AV.  Case discussed with cardiology fellow - Dr. Clayton Bibles.  He agrees with plan for discharge.  He does not feel the second troponin is required.  He will pass on information regarding case to Dr. Johney Frame.  Patient understands need for close follow-up with cardiology in the outpatient setting.  Patient would likely benefit from cardiac monitoring in the outpatient setting.    Final Clinical Impression(s) / ED Diagnoses Final diagnoses:  Palpitations    Rx / DC Orders ED Discharge Orders     None        Valarie Merino, MD 01/19/21  [phone]

## 2021-01-19 NOTE — Discharge Instructions (Addendum)
Return for any problem.   Follow-up with Dr. Johney Frame tomorrow.

## 2021-01-21 ENCOUNTER — Telehealth: Payer: Self-pay | Admitting: *Deleted

## 2021-01-21 NOTE — Telephone Encounter (Signed)
-----   Message from Liliane Shi, [place] sent at 01/21/2021  2:03 PM EDT ----- Thanks Gerald Stabs  Danielle/Deshondra Worst - can you see if we can get her scheduled to see me or Vin in the next 1-2 weeks?  Looks like she did not want to take a beta-blocker when she saw Nira Conn earlier this year.  We can see her and decide if she needs another monitor or referral to EP. Thanks! Scott  ----- Message ----- From: Ronaldo Miyamoto, MD Sent: 01/19/2021  10:50 PM EDT To: Liliane Shi, PA-C, Leanor Kail, PA, #  Hi Dr. Johney Frame, Cephus Shelling, and [place],  Ms. Creedon came into the ED this evening with palpitations and some vague chest discomfort. Reportedly when EMS saw her she was in an SVT with a rate around 150. She was given dilt, and has been in sinus in the 80s during her ED stay. Labs all reassuring. ED is discharging, but just wanted to make sure she had some follow-up. Not sure if another monitor would be helpful vs just starting a BB. Please let me know if you have any other questions.   Gerald Stabs

## 2021-01-21 NOTE — Telephone Encounter (Signed)
Call placed to pt regarding message below.  Pt is aware that I will send a message to see if we can get her worked in with [employer], PA-C, as there is no availability within 1-2 weeks, as requested.  Will call pt back once we get a date / time.  Per Pt, has to be a Tuesday afternoon, this Tuesday, 01/26/21, preferably.

## 2021-01-21 NOTE — Telephone Encounter (Signed)
Returned the call to the pt, per Richardson Dopp, PA-C, will see pt 01/26/2021 at 4:15 and pt is aware to arrive at 4:00 for registration.

## 2021-01-26 ENCOUNTER — Other Ambulatory Visit: Payer: Self-pay

## 2021-01-26 ENCOUNTER — Encounter: Payer: Self-pay | Admitting: Physician Assistant

## 2021-01-26 ENCOUNTER — Ambulatory Visit: Payer: Medicare HMO | Admitting: Physician Assistant

## 2021-01-26 VITALS — BP 122/62 | HR 80 | Ht 63.5 in | Wt 136.0 lb

## 2021-01-26 DIAGNOSIS — E039 Hypothyroidism, unspecified: Secondary | ICD-10-CM

## 2021-01-26 DIAGNOSIS — I483 Typical atrial flutter: Secondary | ICD-10-CM | POA: Diagnosis not present

## 2021-01-26 MED ORDER — METOPROLOL SUCCINATE ER 25 MG PO TB24: 25.0000 mg | ORAL_TABLET | Freq: Every day | ORAL | 11 refills | Status: DC

## 2021-01-26 MED ORDER — RIVAROXABAN 20 MG PO TABS: 20.0000 mg | ORAL_TABLET | Freq: Every day | ORAL | 11 refills | Status: DC

## 2021-01-26 NOTE — Patient Instructions (Addendum)
Medication Instructions:  Your physician has recommended you make the following change in your medication:   START Toprol Xl 25 mg taking 1 tablet at night when you take the Xarelto  START Xarelto 20 mg taking 1 tablet at night   *If you need a refill on your cardiac medications before your next appointment, please call your pharmacy*   Lab Work: 6 WEEKS:  BMET & CBC  If you have labs (blood work) drawn today and your tests are completely normal, you will receive your results only by: [place] (if you have MyChart) OR A paper copy in the mail If you have any lab test that is abnormal or we need to change your treatment, we will call you to review the results.   Testing/Procedures: None ordered  You have been referred to  You have been referred to Electrophysiologist for A-Flutter  Follow-Up: At [facility], you and your health needs are our priority.  As part of our continuing mission to provide you with exceptional heart care, we have created designated Provider Care Teams.  These Care Teams include your primary Cardiologist (physician) and Advanced Practice Providers (APPs -  Physician Assistants and Nurse Practitioners) who all work together to provide you with the care you need, when you need it.  We recommend signing up for the patient portal called "MyChart".  Sign up information is provided on this After Visit Summary.  MyChart is used to connect with patients for Virtual Visits (Telemedicine).  Patients are able to view lab/test results, encounter notes, upcoming appointments, etc.  Non-urgent messages can be sent to your provider as well.   To learn more about what you can do with MyChart, go to [email or web address].    Your next appointment:   6 month(s)  The format for your next appointment:   In Person  Provider:   You may see Freada Bergeron, MD or one of the following Advanced Practice Providers on your designated Care Team:   Richardson Dopp, PA-C Robbie Lis, [place]   Other Instructions

## 2021-01-26 NOTE — Progress Notes (Signed)
Cardiology Office Note:    Date:  01/26/2021   ID:  Tammy [name], DOB 01/31/1941, MRN [id number]  PCP:  Shon Baton, MD   [facility] Providers Cardiologist:  Freada Bergeron, MD     Referring MD: Shon Baton, MD   Chief Complaint:  Hospitalization Follow-up (ED visit for tachycardia)    Patient Profile:   Tammy [name] is a 80 y.o. female with:  Supraventricular Tachycardia  Atrial flutter Hx of syncope in 03/2020 Hypothyroidism  Hyperlipidemia  Aortic atherosclerosis  Prior CV studies: ECHOCARDIOGRAM 08/25/20 EF 60-65, no RWMA, GRII DD, normal RVSF, RVSP 33.7, mild LAE, trivial MR, mild AV sclerosis without stenosis  CT CARDIAC SCORING (SELF PAY ONLY) 08/18/2020 FINDINGS: Non-cardiac: See separate report from [facility] Radiology. Ascending aorta: Normal diameter 3.3 cm Pericardium: Normal Coronary arteries: No calcium noted IMPRESSION: Coronary calcium score of 0. Aortic Atherosclerosis (ICD10-I70.0).  CARDIAC TELEMETRY MONITORING-INTERPRETATION ONLY 05/13/2020 Narrative  Patient's monitoring period was from 04/14/2020-05/07/2020  Predominant rhythm was normal sinus rhythm with HR 63bpm with minimum HR 53bpm and maximum HR 147bpm  There was one episode of SVT lasting 23 seconds that correlated with patient triggered event, but no other significant arrhythmias  Rare PVCs, rare PACs  No atrial fibrillation, VT or significant pauses.   History of Present Illness: Tammy [name] was evaluated by Dr. Johney Frame in 2/22 after admission with possible syncope.  An event monitor was obtained after discharge and demonstrated 1 run of SVT lasting 23 seconds.  A follow-up echocardiogram demonstrated normal LV function.  A coronary artery calcium score was also obtained and this was 0.   Therefore, she did not require ischemic evaluation.  She was seen in the emergency room 01/19/2021 with palpitations and chest pain.  Notes indicate that EMS felt that she was in SVT in  the field with a heart rate of 150.  She was given IV diltiazem and was noted to be in sinus rhythm with heart rate in the 80s in the emergency room.  She returns for follow-up.  She is here alone.  Since she went to the emergency room, she has not had any further palpitations.  She has not had chest pain, shortness of breath, syncope.    Past Medical History:  Diagnosis Date   Hypothyroidism    Skin lesion of neck    Anterior Neck (Thyroid Tissue)   Thyroid disease    Current Medications: Current Meds  Medication Sig   ascorbic acid (VITAMIN C) 500 MG tablet Take 500 mg by mouth daily.   Cholecalciferol (VITAMIN D3) 2000 units TABS Take 2,000 Units by mouth 2 (two) times a week.   EPINEPHrine 0.3 mg/0.3 mL IJ SOAJ injection Inject 0.3 mg into the muscle daily as needed (for anaphylatic reactions (BEE STINGS)). For bee stings   levothyroxine (SYNTHROID, LEVOTHROID) 125 MCG tablet Take 125 mcg by mouth daily before breakfast. Per patient taking 1/2 tablet on Wednesday and Saturday.   Magnesium 500 MG TABS Take 500 mg by mouth at bedtime.   metoprolol succinate (TOPROL XL) 25 MG 24 hr tablet Take 1 tablet (25 mg total) by mouth daily.   Multiple Vitamin (MULTIVITAMIN WITH MINERALS) TABS tablet Take 1 tablet by mouth once a week.   rivaroxaban (XARELTO) 20 MG TABS tablet Take 1 tablet (20 mg total) by mouth daily with supper.    Allergies:   Bee venom   Social History   Tobacco Use   Smoking status: Former   Smokeless tobacco: Never  Vaping Use   Vaping Use: Never used  Substance Use Topics   Alcohol use: Never   Drug use: Never    Family Hx: The patient's family history includes Heart disease in her father; Stomach cancer in her paternal grandmother. There is no history of Colon cancer or Pancreatic cancer.  Review of Systems  Respiratory:  Negative for snoring.     EKGs/Labs/Other Test Reviewed:    EKG:  EKG is not ordered today.  The ekg ordered today demonstrates n/a EKG  from EMS dated 01/19/2021 was personally reviewed and demonstrates typical appearing atrial flutter with HR 129  Recent Labs: 01/19/2021: ALT 25; BUN 24; Creatinine, Ser 0.71; Hemoglobin 13.1; Platelets 326; Potassium 4.2; Sodium 133   Recent Lipid Panel No results found for: CHOL, TRIG, HDL, LDLCALC, LDLDIRECT   Risk Assessment/Calculations:    CHA2DS2-VASc Score = 3   This indicates a 3.2% annual risk of stroke. The patient's score is based upon: CHF History: 0 HTN History: 0 Diabetes History: 0 Stroke History: 0 Vascular Disease History: 0 Age Score: 2 Gender Score: 1        Physical Exam:    VS:  BP 122/62   Pulse 80   Ht 5' 3.5" (1.613 m)   Wt 136 lb (61.7 kg)   SpO2 98%   BMI 23.71 kg/m     Wt Readings from Last 3 Encounters:  01/26/21 136 lb (61.7 kg)  01/19/21 140 lb (63.5 kg)  06/02/20 132 lb 3.2 oz (60 kg)    Constitutional:      Appearance: Healthy appearance. Not in distress.  Neck:     Vascular: JVD normal.  Pulmonary:     Effort: Pulmonary effort is normal.     Breath sounds: No wheezing. No rales.  Cardiovascular:     Normal rate. Regular rhythm. Normal S1. Normal S2.      Murmurs: There is no murmur.  Edema:    Peripheral edema absent.  Abdominal:     Palpations: Abdomen is soft.  Skin:    General: Skin is warm and dry.  Neurological:     Mental Status: Alert and oriented to person, place and time.     Cranial Nerves: Cranial nerves are intact.       ASSESSMENT & PLAN:   1. Typical atrial flutter ([place]) She was recently seen in the emergency room and electrocardiogram obtained by EMS does demonstrate atrial flutter.  I was able to pull this up in the media tab in her chart.  Atrial flutter appears to be typical and is likely an avoidable rhythm.  I reviewed her electrocardiogram today with Dr. Radford Pax (attending MD) who agreed.  CHA2DS2-VASc Score = 3 [CHF History: 0, HTN History: 0, Diabetes History: 0, Stroke History: 0, Vascular Disease  History: 0, Age Score: 2, Gender Score: 1].  Therefore, the patient's annual risk of stroke is 3.2 %.   We discussed the rationale for anticoagulation.  Her weight is at the cutoff for dosing with Apixaban.  Therefore, I will place her on rivaroxaban.  -Rivaroxaban 20 mg daily  -Metoprolol succinate 25 mg daily  -BMET, CBC in 6 weeks  -Refer to EP for atrial flutter  2. Hypothyroidism, unspecified type Managed by primary care.        Dispo:  Return evaluation with EP for atrial flutter.   Medication Adjustments/Labs and Tests Ordered: Current medicines are reviewed at length with the patient today.  Concerns regarding medicines are outlined above.  Tests Ordered:  Orders Placed This Encounter  Procedures   Basic metabolic panel   CBC   Ambulatory referral to Cardiac Electrophysiology   Medication Changes: Meds ordered this encounter  Medications   rivaroxaban (XARELTO) 20 MG TABS tablet    Sig: Take 1 tablet (20 mg total) by mouth daily with supper.    Dispense:  30 tablet    Refill:  11    PLEASE MAIL PT'S PRESCRIPTIONS TO HER, PER HER REQUEST   metoprolol succinate (TOPROL XL) 25 MG 24 hr tablet    Sig: Take 1 tablet (25 mg total) by mouth daily.    Dispense:  30 tablet    Refill:  11    PLEASE MAIL PT'S PRESCRIPTIONS TO HER, PER HER REQUEST   Signed, Richardson Dopp, PA-C  01/26/2021 [facility] [place] [place], [place], [place]  [address] Phone: [phone]; Fax: [phone]

## 2021-01-27 ENCOUNTER — Telehealth: Payer: Self-pay | Admitting: Chiropractic Medicine

## 2021-01-27 NOTE — Telephone Encounter (Signed)
Called pt and pt stated that she was given a eliquis coupon and not a Xarelto coupon. I apologized for pt getting the wrong coupon and asked pt if she would like to come and get the correct coupon. Pt stated that she has samples of Xarelto and that she would wait until her refill to see how much the medication is going to cost, before making an decision about applying for patient assistance. I advised the pt that if she has any other problems, questions or concerns, to give our office a call back. Pt verbalized understanding.

## 2021-01-27 NOTE — Telephone Encounter (Signed)
New message    Pt states that while she was here yesterday for her visit, she was given samples of medication until hers can be mailed out to her. She said she was given samples of xarelto and given a sample thing of elliquis and wants to know what she is supposed to do with it.

## 2021-02-02 DIAGNOSIS — H5203 Hypermetropia, bilateral: Secondary | ICD-10-CM | POA: Diagnosis not present

## 2021-02-02 DIAGNOSIS — Z0100 Encounter for examination of eyes and vision without abnormal findings: Secondary | ICD-10-CM | POA: Diagnosis not present

## 2021-03-09 ENCOUNTER — Other Ambulatory Visit: Payer: Self-pay

## 2021-03-09 ENCOUNTER — Encounter: Payer: Self-pay | Admitting: Cardiology

## 2021-03-09 ENCOUNTER — Other Ambulatory Visit: Payer: Medicare HMO | Admitting: *Deleted

## 2021-03-09 ENCOUNTER — Ambulatory Visit: Payer: Medicare HMO | Admitting: Cardiology

## 2021-03-09 VITALS — BP 118/60 | HR 58 | Ht 63.5 in | Wt 140.0 lb

## 2021-03-09 DIAGNOSIS — I483 Typical atrial flutter: Secondary | ICD-10-CM

## 2021-03-09 DIAGNOSIS — E039 Hypothyroidism, unspecified: Secondary | ICD-10-CM | POA: Diagnosis not present

## 2021-03-09 DIAGNOSIS — R55 Syncope and collapse: Secondary | ICD-10-CM | POA: Diagnosis not present

## 2021-03-09 NOTE — Progress Notes (Signed)
Electrophysiology Office Note:    Date:  03/09/2021   ID:  Tammy [name], DOB 1940/05/09, MRN [id number]  PCP:  Shon Baton, MD  [facility] Cardiologist:  Freada Bergeron, MD  [facility] Electrophysiologist:  Vickie Epley, MD   Referring MD: Liliane Shi, PA-C   Chief Complaint: Atrial flutter  History of Present Illness:    Tammy [name] is a 80 y.o. female who presents for an evaluation of atrial flutter at the request of Richardson Dopp, PA-C. Their medical history includes hypothyroidism.  The patient was seen by Richardson Dopp on January 26, 2021.  She been seen previously by Dr. Johney Frame for syncope.  An event monitor after discharge showed 1 run of SVT lasting 23 seconds.  She was then seen in the emergency department in September of this year with palpitations.  She was treated with IV diltiazem by EMS.  When she was evaluated in the emergency department she was noted to be in normal rhythm.  EMS rhythm strip demonstrates typical appearing atrial flutter.  She is referred to discuss management options for typical atrial flutter.  She is joined in clinic today by 2 family members.  She helps take care of her husband who is bed-bound.   She is very interested in avoiding medications if at all possible. She feels poorly on metoprolol.   Past Medical History:  Diagnosis Date   Hypothyroidism    Skin lesion of neck    Anterior Neck (Thyroid Tissue)   Thyroid disease     Past Surgical History:  Procedure Laterality Date   ANAL FISSURE REPAIR     broken elbow     pins   ELBOW SURGERY Right    EYE SURGERY     bilateral cataracts    FOOT SURGERY Left    LESION REMOVAL N/A 10/11/2017   Procedure: EXCISION ANTERIOR NECK SKIN LESION ERAS PATHWAY;  Surgeon: Coralie Keens, MD;  Location: [facility];  Service: General;  Laterality: N/A;   needle removed from foot     THYROID SURGERY     TONSILLECTOMY     TUBAL LIGATION      Current Medications: Current Meds   Medication Sig   ascorbic acid (VITAMIN C) 500 MG tablet Take 500 mg by mouth daily.   Cholecalciferol (VITAMIN D3) 2000 units TABS Take 2,000 Units by mouth 2 (two) times a week.   EPINEPHrine 0.3 mg/0.3 mL IJ SOAJ injection Inject 0.3 mg into the muscle daily as needed (for anaphylatic reactions (BEE STINGS)). For bee stings   levothyroxine (SYNTHROID, LEVOTHROID) 125 MCG tablet Take 125 mcg by mouth daily before breakfast. Per patient taking 1/2 tablet on Wednesday and Saturday.   Magnesium 500 MG TABS Take 500 mg by mouth at bedtime.   metoprolol succinate (TOPROL XL) 25 MG 24 hr tablet Take 1 tablet (25 mg total) by mouth daily.   Multiple Vitamin (MULTIVITAMIN WITH MINERALS) TABS tablet Take 1 tablet by mouth once a week.   rivaroxaban (XARELTO) 20 MG TABS tablet Take 1 tablet (20 mg total) by mouth daily with supper.     Allergies:   Bee venom   Social History   Socioeconomic History   Marital status: Married    Spouse name: Not on file   Number of children: Not on file   Years of education: Not on file   Highest education level: Not on file  Occupational History   Not on file  Tobacco Use   Smoking  status: Former   Smokeless tobacco: Never  Scientific laboratory technician Use: Never used  Substance and Sexual Activity   Alcohol use: Never   Drug use: Never   Sexual activity: Not on file  Other Topics Concern   Not on file  Social History Narrative   ** Merged History Encounter **       Social Determinants of Health   Financial Resource Strain: Not on file  Food Insecurity: Not on file  Transportation Needs: Not on file  Physical Activity: Not on file  Stress: Not on file  Social Connections: Not on file     Family History: The patient's family history includes Heart disease in her father; Stomach cancer in her paternal grandmother. There is no history of Colon cancer or Pancreatic cancer.  ROS:   Please see the history of present illness.    All other systems  reviewed and are negative.  EKGs/Labs/Other Studies Reviewed:    The following studies were reviewed today:  August 25, 2020 echo Left ventricular function normal, 60% Right ventricular function normal Mildly dilated left atrium Trivial MR  May 13, 2020 ZIO monitor personally reviewed No A. fib SVT lasting 23 seconds that corresponded to patient triggered event ?  Atrial flutter  January 21, 2021 EMS EKG Typical appearing atrial flutter   EKG:  The ekg ordered today demonstrates sinus bradycardia   Recent Labs: 01/19/2021: ALT 25; BUN 24; Creatinine, Ser 0.71; Hemoglobin 13.1; Platelets 326; Potassium 4.2; Sodium 133  Recent Lipid Panel No results found for: CHOL, TRIG, HDL, CHOLHDL, VLDL, LDLCALC, LDLDIRECT  Physical Exam:    VS:  BP 118/60   Pulse (!) 58   Ht 5' 3.5" (1.613 m)   Wt 140 lb (63.5 kg)   SpO2 94%   BMI 24.41 kg/m     Wt Readings from Last 3 Encounters:  03/09/21 140 lb (63.5 kg)  01/26/21 136 lb (61.7 kg)  01/19/21 140 lb (63.5 kg)     GEN: Well nourished, well developed in no acute distress HEENT: Normal NECK: No JVD; No carotid bruits LYMPHATICS: No lymphadenopathy CARDIAC: RRR, no murmurs, rubs, gallops RESPIRATORY:  Clear to auscultation without rales, wheezing or rhonchi  ABDOMEN: Soft, non-tender, non-distended MUSCULOSKELETAL:  No edema; No deformity  SKIN: Warm and dry NEUROLOGIC:  Alert and oriented x 3 PSYCHIATRIC:  Normal affect       ASSESSMENT:    1. Typical atrial flutter ([place])   2. Hypothyroidism, unspecified type   3. Syncope and collapse    PLAN:    In order of problems listed above:  #Typical atrial flutter Symptomatic. Multiple episodes. On xarelto for stroke ppx. Patient prefers rhythm control strategy. I discussed antiarrhythmic drugs and catheter ablation during today's appointment. Ablation procedure including risks, recovery and efficacy discussed. We specifically discussed the possibility of  developing AF after a successful AFL ablation. She would like to proceed with scheduling.  Risk, benefits, and alternatives to EP study and radiofrequency ablation for AFL were also discussed in detail today. These risks include but are not limited to stroke, bleeding, vascular damage, tamponade, perforation, damage to the esophagus, lungs, and other structures, pulmonary vein stenosis, worsening renal function, and death. The patient understands these risk and wishes to proceed.  We will therefore proceed with catheter ablation at the next available time.  Carto, ICE, anesthesia are requested for the procedure.    We plan to implant a loop recorder at the 3 month mark after the ablation  if she is maintaining sinus rhythm in an effort to avoid long term exposure to anticoagulation.  Total time spent with patient today 65 minutes. This includes reviewing records, evaluating the patient and coordinating care.  Medication Adjustments/Labs and Tests Ordered: Current medicines are reviewed at length with the patient today.  Concerns regarding medicines are outlined above.  Orders Placed This Encounter  Procedures   EKG 12-Lead    No orders of the defined types were placed in this encounter.    Signed, Hilton Cork. Quentin Ore, MD, [facility], [facility] 03/09/2021 7:05 PM    Electrophysiology [facility]

## 2021-03-09 NOTE — Patient Instructions (Addendum)
Medication Instructions:  Your physician recommends that you continue on your current medications as directed. Please refer to the Current Medication list given to you today. *If you need a refill on your cardiac medications before your next appointment, please call your pharmacy*  Lab Work: None ordered. If you have labs (blood work) drawn today and your tests are completely normal, you will receive your results only by: [place] (if you have MyChart) OR A paper copy in the mail If you have any lab test that is abnormal or we need to change your treatment, we will call you to review the results.  Testing/Procedures: Your physician has recommended that you have an ablation. Catheter ablation is a medical procedure used to treat some cardiac arrhythmias (irregular heartbeats). During catheter ablation, a long, thin, flexible tube is put into a blood vessel in your groin (upper thigh), or neck. This tube is called an ablation catheter. It is then guided to your heart through the blood vessel. Radio frequency waves destroy small areas of heart tissue where abnormal heartbeats may cause an arrhythmia to start. Please see the instruction sheet given to you today.  Follow-Up:  Will schedule atrial flutter ablation

## 2021-03-10 LAB — BASIC METABOLIC PANEL
BUN/Creatinine Ratio: 29 — ABNORMAL HIGH (ref 12–28)
BUN: 16 mg/dL (ref 8–27)
CO2: 22 mmol/L (ref 20–29)
Calcium: 9.9 mg/dL (ref 8.7–10.3)
Chloride: 100 mmol/L (ref 96–106)
Creatinine, Ser: 0.55 mg/dL — ABNORMAL LOW (ref 0.57–1.00)
Glucose: 111 mg/dL — ABNORMAL HIGH (ref 70–99)
Potassium: 4.5 mmol/L (ref 3.5–5.2)
Sodium: 136 mmol/L (ref 134–144)
eGFR: 93 mL/min/{1.73_m2} (ref >59)

## 2021-03-10 LAB — CBC
Hematocrit: 37 % (ref 34.0–46.6)
Hemoglobin: 12.9 g/dL (ref 11.1–15.9)
MCH: 30.6 pg (ref 26.6–33.0)
MCHC: 34.9 g/dL (ref 31.5–35.7)
MCV: 88 fL (ref 79–97)
Platelets: 335 10*3/uL (ref 150–450)
RBC: 4.21 x10E6/uL (ref 3.77–5.28)
RDW: 12.5 % (ref 11.7–15.4)
WBC: 6.8 10*3/uL (ref 3.4–10.8)

## 2021-03-29 DIAGNOSIS — E785 Hyperlipidemia, unspecified: Secondary | ICD-10-CM | POA: Diagnosis not present

## 2021-03-29 DIAGNOSIS — E039 Hypothyroidism, unspecified: Secondary | ICD-10-CM | POA: Diagnosis not present

## 2021-03-30 DIAGNOSIS — R300 Dysuria: Secondary | ICD-10-CM | POA: Diagnosis not present

## 2021-04-12 ENCOUNTER — Telehealth: Payer: Self-pay

## 2021-04-12 DIAGNOSIS — I483 Typical atrial flutter: Secondary | ICD-10-CM

## 2021-04-12 NOTE — Telephone Encounter (Signed)
Pt scheduled for atrial flutter ablation on June 17, 2021  Labs scheduled for Feb 3  Instruction letter mailed  Work up complete.

## 2021-04-28 DIAGNOSIS — H353132 Nonexudative age-related macular degeneration, bilateral, intermediate dry stage: Secondary | ICD-10-CM | POA: Diagnosis not present

## 2021-05-17 NOTE — Progress Notes (Deleted)
Cardiology Office Note:    Date:  05/17/2021   ID:  YASHICA STERBENZ, DOB [birth year], MRN [id number]  PCP:  Shon Baton, MD  [facility] Cardiologist:  Freada Bergeron, MD  [facility] Electrophysiologist:  Vickie Epley, MD   Referring MD: Shon Baton, MD    History of Present Illness:    Tammy [name] is a 81 y.o. female with a hx of hypothyroidism and episode of syncope who now presents to clinic for follow-up.  Patient was initially seen after episode of syncope. Specifically, she presented to [facility] on 03/31/30 for signs of head trauma after being found down [facility]. On presentation there, she was notably confused with a scalp hematoma. She does not recall tripping or falling or any presyncopal symptoms. CT head confirmed scalp hematoma but fortunately no other acute pathology. Cardiac monitor from 04/14/20-05/07/20 revealed NSR with 1 episode of SVT lasting 23 seconds which was symptomatic. She was referred to Cardiology clinic for further management.  Was seen in clinic on 06/2020 following the episode. TTE 07/2020 with normal EF, no significant valve disease. Coronary Ca score 07/2020 0. She then presented to the ER on 12/2020 for palpitations and chest pain found to be in aflutter with HR 150s. She received IV dilt and converted to NSR. Followed up with Dr. Quentin Ore on 03/2021 and was planned for Aflutter ablation. She is presenting to clinic to discuss this further.  Today, ***  Past Medical History:  Diagnosis Date   Hypothyroidism    Skin lesion of neck    Anterior Neck (Thyroid Tissue)   Thyroid disease     Past Surgical History:  Procedure Laterality Date   ANAL FISSURE REPAIR     broken elbow     pins   ELBOW SURGERY Right    EYE SURGERY     bilateral cataracts    FOOT SURGERY Left    LESION REMOVAL N/A 10/11/2017   Procedure: EXCISION ANTERIOR NECK SKIN LESION ERAS PATHWAY;  Surgeon: Coralie Keens, MD;  Location: [place];  Service: General;   Laterality: N/A;   needle removed from foot     THYROID SURGERY     TONSILLECTOMY     TUBAL LIGATION      Current Medications: No outpatient medications have been marked as taking for the 05/24/21 encounter (Appointment) with Freada Bergeron, MD.     Allergies:   Bee venom   Social History   Socioeconomic History   Marital status: Married    Spouse name: Not on file   Number of children: Not on file   Years of education: Not on file   Highest education level: Not on file  Occupational History   Not on file  Tobacco Use   Smoking status: Former   Smokeless tobacco: Never  Vaping Use   Vaping Use: Never used  Substance and Sexual Activity   Alcohol use: Never   Drug use: Never   Sexual activity: Not on file  Other Topics Concern   Not on file  Social History Narrative   ** Merged History Encounter **       Social Determinants of Health   Financial Resource Strain: Not on file  Food Insecurity: Not on file  Transportation Needs: Not on file  Physical Activity: Not on file  Stress: Not on file  Social Connections: Not on file     Family History: The patient's family history includes Heart disease in her father; Stomach cancer in her  paternal grandmother. There is no history of Colon cancer or Pancreatic cancer.  ROS:   Please see the history of present illness.    Review of Systems  Constitutional:  Negative for chills and fever.  HENT:  Negative for nosebleeds.   Eyes:  Negative for blurred vision and photophobia.  Respiratory:  Negative for shortness of breath.   Cardiovascular:  Negative for chest pain, palpitations, orthopnea, claudication, leg swelling and PND.  Gastrointestinal:  Negative for melena, nausea and vomiting.  Genitourinary:  Negative for dysuria and hematuria.  Musculoskeletal:  Positive for falls and myalgias.  Neurological:  Positive for loss of consciousness. Negative for dizziness, focal weakness and seizures.  Endo/Heme/Allergies:   Negative for polydipsia.  Psychiatric/Behavioral:  Negative for substance abuse. The patient is nervous/anxious.    EKGs/Labs/Other Studies Reviewed:    The following studies were reviewed today:  ECHOCARDIOGRAM 08/25/20 EF 60-65, no RWMA, GRII DD, normal RVSF, RVSP 33.7, mild LAE, trivial MR, mild AV sclerosis without stenosis   CT CARDIAC SCORING (SELF PAY ONLY) 08/18/2020 FINDINGS: Non-cardiac: See separate report from [facility]. Ascending aorta: Normal diameter 3.3 cm Pericardium: Normal Coronary arteries: No calcium noted IMPRESSION: Coronary calcium score of 0. Aortic Atherosclerosis (ICD10-I70.0). CT abd/pelvis 03/31/20: IMPRESSION: 1. Mild diffuse stranding of the mediastinal fat surrounding the aorta and pulmonary arteries may represent edema or contusion. No extravascular contrast to suggest active arterial bleed. No fluid collection or hematoma. No other acute/traumatic intrathoracic, abdominal, or pelvic pathology. 2. 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 No bowel obstruction. Normal appendix. 3. Mixing artifact versus less likely DVT in the common femoral veins bilaterally. Duplex ultrasound may provide better evaluation if there is high clinical concern for acute DVT. 4. Aortic Atherosclerosis (ICD10-I70.0).  Cardiac Monitor 05/13/20: Patient's monitoring period was from 04/14/2020-05/07/2020 Predominant rhythm was normal sinus rhythm with HR 63bpm with minimum HR 53bpm and maximum HR 147bpm There was one episode of SVT lasting 23 seconds that correlated with patient triggered event, but no other significant arrhythmias Rare PVCs, rare PACs No atrial fibrillation, VT or significant pauses.    Recent Labs: 01/19/2021: ALT 25 03/09/2021: BUN 16; Creatinine, Ser 0.55; Hemoglobin 12.9; Platelets 335; Potassium 4.5; Sodium 136  Recent Lipid Panel No results found for: CHOL, TRIG, HDL, CHOLHDL, VLDL, LDLCALC, LDLDIRECT   Physical  Exam:    VS:  There were no vitals taken for this visit.    Wt Readings from Last 3 Encounters:  03/09/21 140 lb (63.5 kg)  01/26/21 136 lb (61.7 kg)  01/19/21 140 lb (63.5 kg)     GEN:  Well nourished, well developed in no acute distress HEENT: Normal NECK: No JVD; No carotid bruits CARDIAC: RRR, no murmurs, rubs, gallops RESPIRATORY:  Clear to auscultation without rales, wheezing or rhonchi  ABDOMEN: Soft, non-tender, non-distended MUSCULOSKELETAL:  No edema; No deformity  SKIN: Warm and dry NEUROLOGIC:  Alert and oriented x 3 PSYCHIATRIC:  Normal affect   ASSESSMENT:    No diagnosis found.  PLAN:    In order of problems listed above:  #Atrial Flutter: Patient with presentation to [facility] ED in 12/2020 with chest pain and palpitations found to be in Aflutter with HR 150s. Received IV dilt with conversion. Has since been on xarelto for [facility] and metoprolol 25mg  XL daily. She saw Dr. Quentin Ore and is planned for Aflutter ablation. -Plan for Aflutter ablation -Continue metop 25mg  XL daily -Continue xarelto  #Fall with Headstrike #Possible Syncope: Patient with presentation to ED in 03/2020  after being found down with head hematoma at [facility]. Unclear etiology of event as patient has no recollection of what happened. Denied any preceding chest pain, palpitations, SOB, nausea or diaphoresis. Has a history of tripping when walking and she thinks she may have stumbled and hit her head however also has newly diagnosed Aflutter. TTE with normal EF, no valve disease. Ca score 0 (did not want to pursue ischemic testing at that time). No further episodes. -Manage Aflutter as above -Continue metop 25mg  XL daily  #Elevated LDL: Ca score 0 in 07/2020. Declined statin therapy.    Medication Adjustments/Labs and Tests Ordered: Current medicines are reviewed at length with the patient today.  Concerns regarding medicines are outlined above.  No orders of the defined types were placed in  this encounter.  No orders of the defined types were placed in this encounter.   There are no Patient Instructions on file for this visit.    Signed, Freada Bergeron, MD  05/17/2021 1:59 PM    [place]

## 2021-05-24 ENCOUNTER — Ambulatory Visit: Payer: Medicare HMO | Admitting: Cardiology

## 2021-05-24 ENCOUNTER — Other Ambulatory Visit: Payer: Self-pay

## 2021-05-24 ENCOUNTER — Encounter: Payer: Self-pay | Admitting: Cardiology

## 2021-05-24 VITALS — BP 124/70 | HR 64 | Ht 63.5 in | Wt 142.2 lb

## 2021-05-24 DIAGNOSIS — I483 Typical atrial flutter: Secondary | ICD-10-CM

## 2021-05-24 DIAGNOSIS — E7800 Pure hypercholesterolemia, unspecified: Secondary | ICD-10-CM | POA: Diagnosis not present

## 2021-05-24 DIAGNOSIS — R55 Syncope and collapse: Secondary | ICD-10-CM | POA: Diagnosis not present

## 2021-05-24 NOTE — Progress Notes (Signed)
Cardiology Office Note:    Date:  05/24/2021   ID:  Tammy [name], DOB 07/17/1940, MRN [id number]  PCP:  Shon Baton, MD  [facility] Cardiologist:  Freada Bergeron, MD  [facility] Electrophysiologist:  Vickie Epley, MD   Referring MD: Shon Baton, MD    History of Present Illness:    Tammy [name] is a 81 y.o. female with a hx of hypothyroidism, Aflutter on xarelto, and episode of syncope who now presents to clinic for follow-up.  Patient was initially seen after episode of syncope. Specifically, she presented to [facility] on 03/31/30 for signs of head trauma after being found down [place]. On presentation there, she was notably confused with a scalp hematoma. She does not recall tripping or falling or any presyncopal symptoms. CT head confirmed scalp hematoma but fortunately no other acute pathology. Cardiac monitor from 04/14/20-05/07/20 revealed NSR with 1 episode of SVT lasting 23 seconds which was symptomatic. She was referred to Cardiology clinic for further management.  Was seen in clinic on 06/2020 following the episode. TTE 07/2020 with normal EF, no significant valve disease. Coronary Ca score 07/2020 0. She then presented to the ER on 12/2020 for palpitations and chest pain found to be in aflutter with HR 150s. She received IV dilt and converted to NSR. Followed up with Dr. Quentin Ore on 03/2021 and was planned for Aflutter ablation. She is presenting to clinic to discuss this further.  Today, she is doing well. Her thyroid medication was decreased and her Aflutter episodes/palpitations have improved. She is compliant with taking Xarelto. She is hesitant about undergoing ablation because of how the anaesthesia may affect her. She states her friend recently had a procedure done and she "developed dementia" following anesthesia. She is torn as to whether she wants to have it done as she is concerned about the risks associated with the procedure/anesthesia but she also  would like to be able to stop her medications in the future. After a long discussion, she has decided to follow-up in clinic in the early summer to see how she is feeling and if she would like to have the procedure done at that time.   The patient denies chest pain, chest pressure, dyspnea at rest or with exertion, PND, orthopnea, or leg swelling. Denies cough, fever, chills. Denies nausea, vomiting. Denies syncope or presyncope. Denies dizziness or lightheadedness. Denies snoring.  Past Medical History:  Diagnosis Date   Hypothyroidism    Skin lesion of neck    Anterior Neck (Thyroid Tissue)   Thyroid disease     Past Surgical History:  Procedure Laterality Date   ANAL FISSURE REPAIR     broken elbow     pins   ELBOW SURGERY Right    EYE SURGERY     bilateral cataracts    FOOT SURGERY Left    LESION REMOVAL N/A 10/11/2017   Procedure: EXCISION ANTERIOR NECK SKIN LESION ERAS PATHWAY;  Surgeon: Coralie Keens, MD;  Location: [place];  Service: General;  Laterality: N/A;   needle removed from foot     THYROID SURGERY     TONSILLECTOMY     TUBAL LIGATION      Current Medications: Current Meds  Medication Sig   ascorbic acid (VITAMIN C) 500 MG tablet Take 500 mg by mouth daily.   Cholecalciferol (VITAMIN D3) 2000 units TABS Take 2,000 Units by mouth 2 (two) times a week.   EPINEPHrine 0.3 mg/0.3 mL IJ SOAJ injection Inject 0.3 mg  into the muscle daily as needed (for anaphylatic reactions (BEE STINGS)). For bee stings   levothyroxine (SYNTHROID, LEVOTHROID) 125 MCG tablet Take 125 mcg by mouth daily before breakfast.   Magnesium 500 MG TABS Take 500 mg by mouth at bedtime.   metoprolol succinate (TOPROL XL) 25 MG 24 hr tablet Take 1 tablet (25 mg total) by mouth daily.   Multiple Vitamin (MULTIVITAMIN WITH MINERALS) TABS tablet Take 1 tablet by mouth once a week.   rivaroxaban (XARELTO) 20 MG TABS tablet Take 1 tablet (20 mg total) by mouth daily with supper.     Allergies:   Bee  venom   Social History   Socioeconomic History   Marital status: Married    Spouse name: Not on file   Number of children: Not on file   Years of education: Not on file   Highest education level: Not on file  Occupational History   Not on file  Tobacco Use   Smoking status: Former   Smokeless tobacco: Never  Vaping Use   Vaping Use: Never used  Substance and Sexual Activity   Alcohol use: Never   Drug use: Never   Sexual activity: Not on file  Other Topics Concern   Not on file  Social History Narrative   ** Merged History Encounter **       Social Determinants of Health   Financial Resource Strain: Not on file  Food Insecurity: Not on file  Transportation Needs: Not on file  Physical Activity: Not on file  Stress: Not on file  Social Connections: Not on file     Family History: The patient's family history includes Heart disease in her father; Stomach cancer in her paternal grandmother. There is no history of Colon cancer or Pancreatic cancer.  ROS:   Please see the history of present illness.    Review of Systems  Constitutional:  Negative for chills and fever.  HENT:  Negative for nosebleeds.   Eyes:  Positive for pain (Dry eyes). Negative for blurred vision and photophobia.  Respiratory:  Negative for shortness of breath.   Cardiovascular:  Positive for palpitations. Negative for chest pain, orthopnea, claudication, leg swelling and PND.  Gastrointestinal:  Negative for melena, nausea and vomiting.  Genitourinary:  Negative for dysuria and hematuria.  Musculoskeletal:  Negative for falls and myalgias.  Neurological:  Negative for dizziness, focal weakness, seizures and loss of consciousness.  Endo/Heme/Allergies:  Negative for polydipsia.  Psychiatric/Behavioral:  Negative for substance abuse. The patient is not nervous/anxious.    EKGs/Labs/Other Studies Reviewed:    The following studies were reviewed today:  ECHOCARDIOGRAM 08/25/20 EF 60-65, no RWMA,  GRII DD, normal RVSF, RVSP 33.7, mild LAE, trivial MR, mild AV sclerosis without stenosis   CT CARDIAC SCORING (SELF PAY ONLY) 08/18/2020 FINDINGS: Non-cardiac: See separate report from [facility]. Ascending aorta: Normal diameter 3.3 cm Pericardium: Normal Coronary arteries: No calcium noted IMPRESSION: Coronary calcium score of 0. Aortic Atherosclerosis (ICD10-I70.0). CT abd/pelvis 03/31/20: IMPRESSION: 1. Mild diffuse stranding of the mediastinal fat surrounding the aorta and pulmonary arteries may represent edema or contusion. No extravascular contrast to suggest active arterial bleed. No fluid collection or hematoma. No other acute/traumatic intrathoracic, abdominal, or pelvic pathology. 2. 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 No bowel obstruction. Normal appendix. 3. Mixing artifact versus less likely DVT in the common femoral veins bilaterally. Duplex ultrasound may provide better evaluation if there is high clinical concern for acute DVT. 4. Aortic Atherosclerosis (ICD10-I70.0).  Cardiac Monitor 05/13/20: Patient's monitoring period was from 04/14/2020-05/07/2020 Predominant rhythm was normal sinus rhythm with HR 63bpm with minimum HR 53bpm and maximum HR 147bpm There was one episode of SVT lasting 23 seconds that correlated with patient triggered event, but no other significant arrhythmias Rare PVCs, rare PACs No atrial fibrillation, VT or significant pauses.   EKG: 05/24/21: Sinus rhythm, rate 64 bpm  Recent Labs: 01/19/2021: ALT 25 03/09/2021: BUN 16; Creatinine, Ser 0.55; Hemoglobin 12.9; Platelets 335; Potassium 4.5; Sodium 136  Recent Lipid Panel No results found for: CHOL, TRIG, HDL, CHOLHDL, VLDL, LDLCALC, LDLDIRECT   Physical Exam:    VS:  BP 124/70    Pulse 64    Ht 5' 3.5" (1.613 m)    Wt 142 lb 3.2 oz (64.5 kg)    SpO2 99%    BMI 24.79 kg/m     Wt Readings from Last 3 Encounters:  05/24/21 142 lb 3.2 oz (64.5 kg)  03/09/21 140  lb (63.5 kg)  01/26/21 136 lb (61.7 kg)     GEN:  Well nourished, well developed in no acute distress HEENT: Normal NECK: No JVD; No carotid bruits CARDIAC: RRR, no murmurs, rubs, gallops RESPIRATORY:  Clear to auscultation without rales, wheezing or rhonchi  ABDOMEN: Soft, non-tender, non-distended MUSCULOSKELETAL:  No edema; No deformity  SKIN: Warm and dry NEUROLOGIC:  Alert and oriented x 3 PSYCHIATRIC:  Normal affect   ASSESSMENT:    1. Typical atrial flutter (HCC)   2. Elevated LDL cholesterol level   3. Syncope, unspecified syncope type     PLAN:    In order of problems listed above:  #Atrial Flutter: Patient with presentation to [facility] ED in 12/2020 with chest pain and palpitations found to be in Aflutter with HR 150s. Received IV dilt with conversion. Has since been on xarelto for [facility] and metoprolol 25mg  XL daily. She saw Dr. Quentin Ore and was planned for ablation, but she would like to think this over further before proceeding. Will see me back in June to discuss. Currently, symptoms are better controlled since lowering the dose of her synthroid. -Patient would like to postpone ablation at this time and re-discuss in the summer -Continue metop 25mg  XL daily -Continue xarelto  #Fall with Headstrike #Possible Syncope: Patient with presentation to ED in 03/2020 after being found down with head hematoma at [facility]. She thought she may have tripped and fallen, but could not clearly recall the series of events. She  denied any preceding chest pain, palpitations, SOB, nausea or diaphoresis. TTE with normal EF, no valve disease. Ca score 0 (did not want to pursue ischemic testing at that time). Notably, she has newly diagnosed Aflutter, but she is symptomatic with it and she had no preceding symptoms at that time. Fortunately, she has had no further episodes of syncope since that time. -Manage Aflutter as above -Continue metop 25mg  XL daily  #Elevated LDL: Ca score 0 in  07/2020. Declined statin therapy.    Medication Adjustments/Labs and Tests Ordered: Current medicines are reviewed at length with the patient today.  Concerns regarding medicines are outlined above.  Orders Placed This Encounter  Procedures   EKG 12-Lead   No orders of the defined types were placed in this encounter.   Patient Instructions  Medication Instructions:   Your physician recommends that you continue on your current medications as directed. Please refer to the Current Medication list given to you today.  *If you need a refill on your cardiac medications  before your next appointment, please call your pharmacy*   Lab Work:  None ordered.   If you have labs (blood work) drawn today and your tests are completely normal, you will receive your results only by: [place] (if you have MyChart) OR A paper copy in the mail If you have any lab test that is abnormal or we need to change your treatment, we will call you to review the results.   Testing/Procedures:  None ordered.    Follow-Up: At [facility], you and your health needs are our priority.  As part of our continuing mission to provide you with exceptional heart care, we have created designated Provider Care Teams.  These Care Teams include your primary Cardiologist (physician) and Advanced Practice Providers (APPs -  Physician Assistants and Nurse Practitioners) who all work together to provide you with the care you need, when you need it.  We recommend signing up for the patient portal called "MyChart".  Sign up information is provided on this After Visit Summary.  MyChart is used to connect with patients for Virtual Visits (Telemedicine).  Patients are able to view lab/test results, encounter notes, upcoming appointments, etc.  Non-urgent messages can be sent to your provider as well.   To learn more about what you can do with MyChart, go to [email or web address].    Your next appointment:   5  month(s)  The format for your next appointment:   In Person  Provider:   Freada Bergeron, MD     Other Instructions    Wilhemina Bonito as a scribe for Freada Bergeron, MD.,have documented all relevant documentation on the behalf of Freada Bergeron, MD,as directed by  Freada Bergeron, MD while in the presence of Freada Bergeron, MD.  I, Freada Bergeron, MD, have reviewed all documentation for this visit. The documentation on 05/24/21 for the exam, diagnosis, procedures, and orders are all accurate and complete.   Signed, Freada Bergeron, MD  05/24/2021 11:02 AM    [place]

## 2021-05-24 NOTE — Patient Instructions (Signed)
Medication Instructions:   Your physician recommends that you continue on your current medications as directed. Please refer to the Current Medication list given to you today.  *If you need a refill on your cardiac medications before your next appointment, please call your pharmacy*   Lab Work:  None ordered.   If you have labs (blood work) drawn today and your tests are completely normal, you will receive your results only by: [place] (if you have MyChart) OR A paper copy in the mail If you have any lab test that is abnormal or we need to change your treatment, we will call you to review the results.   Testing/Procedures:  None ordered.    Follow-Up: At [facility], you and your health needs are our priority.  As part of our continuing mission to provide you with exceptional heart care, we have created designated Provider Care Teams.  These Care Teams include your primary Cardiologist (physician) and Advanced Practice Providers (APPs -  Physician Assistants and Nurse Practitioners) who all work together to provide you with the care you need, when you need it.  We recommend signing up for the patient portal called "MyChart".  Sign up information is provided on this After Visit Summary.  MyChart is used to connect with patients for Virtual Visits (Telemedicine).  Patients are able to view lab/test results, encounter notes, upcoming appointments, etc.  Non-urgent messages can be sent to your provider as well.   To learn more about what you can do with MyChart, go to [email or web address].    Your next appointment:   5 month(s)  The format for your next appointment:   In Person  Provider:   Freada Bergeron, MD     Other Instructions

## 2021-06-04 ENCOUNTER — Other Ambulatory Visit: Payer: Medicare HMO

## 2021-06-16 DIAGNOSIS — E039 Hypothyroidism, unspecified: Secondary | ICD-10-CM | POA: Diagnosis not present

## 2021-06-16 DIAGNOSIS — E785 Hyperlipidemia, unspecified: Secondary | ICD-10-CM | POA: Diagnosis not present

## 2021-06-16 DIAGNOSIS — I700 Atherosclerosis of aorta: Secondary | ICD-10-CM | POA: Diagnosis not present

## 2021-06-16 DIAGNOSIS — R739 Hyperglycemia, unspecified: Secondary | ICD-10-CM | POA: Diagnosis not present

## 2021-06-16 DIAGNOSIS — E559 Vitamin D deficiency, unspecified: Secondary | ICD-10-CM | POA: Diagnosis not present

## 2021-06-17 ENCOUNTER — Encounter (HOSPITAL_COMMUNITY): Payer: Self-pay

## 2021-06-17 ENCOUNTER — Ambulatory Visit (HOSPITAL_COMMUNITY): Admit: 2021-06-17 | Payer: Medicare HMO | Admitting: Cardiology

## 2021-06-17 SURGERY — A-FLUTTER ABLATION
Anesthesia: General

## 2021-06-24 DIAGNOSIS — I5189 Other ill-defined heart diseases: Secondary | ICD-10-CM | POA: Diagnosis not present

## 2021-06-24 DIAGNOSIS — I700 Atherosclerosis of aorta: Secondary | ICD-10-CM | POA: Diagnosis not present

## 2021-06-24 DIAGNOSIS — E890 Postprocedural hypothyroidism: Secondary | ICD-10-CM | POA: Diagnosis not present

## 2021-06-24 DIAGNOSIS — Z0000 Encounter for general adult medical examination without abnormal findings: Secondary | ICD-10-CM | POA: Diagnosis not present

## 2021-06-24 DIAGNOSIS — E559 Vitamin D deficiency, unspecified: Secondary | ICD-10-CM | POA: Diagnosis not present

## 2021-06-24 DIAGNOSIS — I483 Typical atrial flutter: Secondary | ICD-10-CM | POA: Diagnosis not present

## 2021-06-24 DIAGNOSIS — F418 Other specified anxiety disorders: Secondary | ICD-10-CM | POA: Diagnosis not present

## 2021-06-24 DIAGNOSIS — E785 Hyperlipidemia, unspecified: Secondary | ICD-10-CM | POA: Diagnosis not present

## 2021-06-24 DIAGNOSIS — M810 Age-related osteoporosis without current pathological fracture: Secondary | ICD-10-CM | POA: Diagnosis not present

## 2021-07-19 DIAGNOSIS — D485 Neoplasm of uncertain behavior of skin: Secondary | ICD-10-CM | POA: Diagnosis not present

## 2021-07-19 DIAGNOSIS — L821 Other seborrheic keratosis: Secondary | ICD-10-CM | POA: Diagnosis not present

## 2021-07-19 DIAGNOSIS — C44719 Basal cell carcinoma of skin of left lower limb, including hip: Secondary | ICD-10-CM | POA: Diagnosis not present

## 2021-07-20 ENCOUNTER — Ambulatory Visit: Payer: Medicare HMO | Admitting: Cardiology

## 2021-08-02 DIAGNOSIS — C44719 Basal cell carcinoma of skin of left lower limb, including hip: Secondary | ICD-10-CM | POA: Diagnosis not present

## 2021-09-14 ENCOUNTER — Ambulatory Visit: Payer: Medicare HMO | Admitting: Cardiology

## 2021-10-10 NOTE — Progress Notes (Deleted)
Cardiology Office Note:    Date:  10/10/2021   ID:  Tammy [name], DOB 1940-10-25, MRN [id number]  PCP:  Shon Baton, MD  [facility] Cardiologist:  Freada Bergeron, MD  [facility] Electrophysiologist:  Vickie Epley, MD   Referring MD: Shon Baton, MD    History of Present Illness:    Tammy [name] is a 81 y.o. female with a hx of hypothyroidism, Aflutter on xarelto, and episode of syncope who now presents to clinic for follow-up.  Patient was initially seen after episode of syncope. Specifically, she presented to [facility] on 03/31/30 for signs of head trauma after being found down [facility]. On presentation there, she was notably confused with a scalp hematoma. She does not recall tripping or falling or any presyncopal symptoms. CT head confirmed scalp hematoma but fortunately no other acute pathology. Cardiac monitor from 04/14/20-05/07/20 revealed NSR with 1 episode of SVT lasting 23 seconds which was symptomatic. She was referred to Cardiology clinic for further management.  Was seen in clinic on 06/2020 following the episode. TTE 07/2020 with normal EF, no significant valve disease. Coronary Ca score 07/2020 0. She then presented to the ER on 12/2020 for palpitations and chest pain found to be in aflutter with HR 150s. She received IV dilt and converted to NSR. Followed up with Dr. Quentin Ore on 03/2021 and was planned for Aflutter ablation however this was deferred.   Was last in clinic on 05/2021 where she was doing well. Palpitations had improved with decreasing her thyroid medications. She wanted to defer ablation procedure given improvement of symptoms.  Today, ***  Past Medical History:  Diagnosis Date   Hypothyroidism    Skin lesion of neck    Anterior Neck (Thyroid Tissue)   Thyroid disease     Past Surgical History:  Procedure Laterality Date   ANAL FISSURE REPAIR     broken elbow     pins   ELBOW SURGERY Right    EYE SURGERY     bilateral  cataracts    FOOT SURGERY Left    LESION REMOVAL N/A 10/11/2017   Procedure: EXCISION ANTERIOR NECK SKIN LESION ERAS PATHWAY;  Surgeon: Coralie Keens, MD;  Location: [place];  Service: General;  Laterality: N/A;   needle removed from foot     THYROID SURGERY     TONSILLECTOMY     TUBAL LIGATION      Current Medications: No outpatient medications have been marked as taking for the 10/14/21 encounter (Appointment) with Freada Bergeron, MD.     Allergies:   Bee venom   Social History   Socioeconomic History   Marital status: Married    Spouse name: Not on file   Number of children: Not on file   Years of education: Not on file   Highest education level: Not on file  Occupational History   Not on file  Tobacco Use   Smoking status: Former   Smokeless tobacco: Never  Vaping Use   Vaping Use: Never used  Substance and Sexual Activity   Alcohol use: Never   Drug use: Never   Sexual activity: Not on file  Other Topics Concern   Not on file  Social History Narrative   ** Merged History Encounter **       Social Determinants of Health   Financial Resource Strain: Not on file  Food Insecurity: Not on file  Transportation Needs: Not on file  Physical Activity: Not on file  Stress:  Not on file  Social Connections: Not on file     Family History: The patient's family history includes Heart disease in her father; Stomach cancer in her paternal grandmother. There is no history of Colon cancer or Pancreatic cancer.  ROS:   Please see the history of present illness.    Review of Systems  Constitutional:  Negative for chills and fever.  HENT:  Negative for nosebleeds.   Eyes:  Positive for pain (Dry eyes). Negative for blurred vision and photophobia.  Respiratory:  Negative for shortness of breath.   Cardiovascular:  Positive for palpitations. Negative for chest pain, orthopnea, claudication, leg swelling and PND.  Gastrointestinal:  Negative for melena, nausea and  vomiting.  Genitourinary:  Negative for dysuria and hematuria.  Musculoskeletal:  Negative for falls and myalgias.  Neurological:  Negative for dizziness, focal weakness, seizures and loss of consciousness.  Endo/Heme/Allergies:  Negative for polydipsia.  Psychiatric/Behavioral:  Negative for substance abuse. The patient is not nervous/anxious.     EKGs/Labs/Other Studies Reviewed:    The following studies were reviewed today:  ECHOCARDIOGRAM 08/25/20 EF 60-65, no RWMA, GRII DD, normal RVSF, RVSP 33.7, mild LAE, trivial MR, mild AV sclerosis without stenosis   CT CARDIAC SCORING (SELF PAY ONLY) 08/18/2020 FINDINGS: Non-cardiac: See separate report from [facility]. Ascending aorta: Normal diameter 3.3 cm Pericardium: Normal Coronary arteries: No calcium noted IMPRESSION: Coronary calcium score of 0. Aortic Atherosclerosis (ICD10-I70.0). CT abd/pelvis 03/31/20: IMPRESSION: 1. Mild diffuse stranding of the mediastinal fat surrounding the aorta and pulmonary arteries may represent edema or contusion. No extravascular contrast to suggest active arterial bleed. No fluid collection or hematoma. No other acute/traumatic intrathoracic, abdominal, or pelvic pathology. 2. 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 No bowel obstruction. Normal appendix. 3. Mixing artifact versus less likely DVT in the common femoral veins bilaterally. Duplex ultrasound may provide better evaluation if there is high clinical concern for acute DVT. 4. Aortic Atherosclerosis (ICD10-I70.0).  Cardiac Monitor 05/13/20: Patient's monitoring period was from 04/14/2020-05/07/2020 Predominant rhythm was normal sinus rhythm with HR 63bpm with minimum HR 53bpm and maximum HR 147bpm There was one episode of SVT lasting 23 seconds that correlated with patient triggered event, but no other significant arrhythmias Rare PVCs, rare PACs No atrial fibrillation, VT or significant pauses.   EKG: 05/24/21:  Sinus rhythm, rate 64 bpm  Recent Labs: 01/19/2021: ALT 25 03/09/2021: BUN 16; Creatinine, Ser 0.55; Hemoglobin 12.9; Platelets 335; Potassium 4.5; Sodium 136  Recent Lipid Panel No results found for: "CHOL", "TRIG", "HDL", "CHOLHDL", "VLDL", "LDLCALC", "LDLDIRECT"   Physical Exam:    VS:  There were no vitals taken for this visit.    Wt Readings from Last 3 Encounters:  05/24/21 142 lb 3.2 oz (64.5 kg)  03/09/21 140 lb (63.5 kg)  01/26/21 136 lb (61.7 kg)     GEN:  Well nourished, well developed in no acute distress HEENT: Normal NECK: No JVD; No carotid bruits CARDIAC: RRR, no murmurs, rubs, gallops RESPIRATORY:  Clear to auscultation without rales, wheezing or rhonchi  ABDOMEN: Soft, non-tender, non-distended MUSCULOSKELETAL:  No edema; No deformity  SKIN: Warm and dry NEUROLOGIC:  Alert and oriented x 3 PSYCHIATRIC:  Normal affect   ASSESSMENT:    No diagnosis found.   PLAN:    In order of problems listed above:  #Atrial Flutter: Patient with presentation to [facility] ED in 12/2020 with chest pain and palpitations found to be in Aflutter with HR 150s. Received IV dilt with conversion.  Has since been on xarelto for [facility] and metoprolol '25mg'$  XL daily. She saw Dr. Quentin Ore and was planned for ablation, but she would like to think this over further before proceeding.  Currently, symptoms are better controlled since lowering the dose of her synthroid. -*** -Continue metop '25mg'$  XL daily -Continue xarelto  #Fall with Headstrike #Possible Syncope: Patient with presentation to ED in 03/2020 after being found down with head hematoma at [facility]. She thought she may have tripped and fallen, but could not clearly recall the series of events. She  denied any preceding chest pain, palpitations, SOB, nausea or diaphoresis. TTE with normal EF, no valve disease. Ca score 0 (did not want to pursue ischemic testing at that time). Notably, she has newly diagnosed Aflutter, but she is  symptomatic with it and she had no preceding symptoms at that time. Fortunately, she has had no further episodes of syncope since that time. -Manage Aflutter as above -Continue metop '25mg'$  XL daily  #Elevated LDL: Ca score 0 in 07/2020. Declined statin therapy.    Medication Adjustments/Labs and Tests Ordered: Current medicines are reviewed at length with the patient today.  Concerns regarding medicines are outlined above.  No orders of the defined types were placed in this encounter.  No orders of the defined types were placed in this encounter.   There are no Patient Instructions on file for this visit.  I,Mykaella Javier,acting as a scribe for Freada Bergeron, MD.,have documented all relevant documentation on the behalf of Freada Bergeron, MD,as directed by  Freada Bergeron, MD while in the presence of Freada Bergeron, MD.  I, Freada Bergeron, MD, have reviewed all documentation for this visit. The documentation on 10/10/21 for the exam, diagnosis, procedures, and orders are all accurate and complete.   Signed, Freada Bergeron, MD  10/10/2021 8:34 PM    [place]

## 2021-10-14 ENCOUNTER — Ambulatory Visit: Payer: Medicare HMO | Admitting: Cardiology

## 2021-11-19 NOTE — Progress Notes (Unsigned)
Cardiology Office Note:    Date:  11/19/2021   ID:  Tammy [name], DOB [birth year], MRN [id number]  PCP:  Shon Baton, MD  [facility] Cardiologist:  Freada Bergeron, MD  [facility] Electrophysiologist:  Vickie Epley, MD   Referring MD: Shon Baton, MD    History of Present Illness:    Tammy [name] is a 81 y.o. female with a hx of hypothyroidism, Aflutter on xarelto, and episode of syncope who now presents to clinic for follow-up.  Patient was initially seen after episode of syncope. Specifically, she presented to [facility] on 03/31/30 for signs of head trauma after being found down [facility]. On presentation there, she was notably confused with a scalp hematoma. She does not recall tripping or falling or any presyncopal symptoms. CT head confirmed scalp hematoma but fortunately no other acute pathology. Cardiac monitor from 04/14/20-05/07/20 revealed NSR with 1 episode of SVT lasting 23 seconds which was symptomatic. She was referred to Cardiology clinic for further management.  Was seen in clinic on 06/2020 following the episode. TTE 07/2020 with normal EF, no significant valve disease. Coronary Ca score 07/2020 0. She then presented to the ER on 12/2020 for palpitations and chest pain found to be in aflutter with HR 150s. She received IV dilt and converted to NSR. Followed up with Dr. Quentin Ore on 03/2021 and was planned for Aflutter ablation. She is presenting to clinic to discuss this further.  Today, she is doing well. Her thyroid medication was decreased and her Aflutter episodes/palpitations have improved. She is compliant with taking Xarelto. She is hesitant about undergoing ablation because of how the anaesthesia may affect her. She states her friend recently had a procedure done and she "developed dementia" following anesthesia. She is torn as to whether she wants to have it done as she is concerned about the risks associated with the procedure/anesthesia but she also  would like to be able to stop her medications in the future. After a long discussion, she has decided to follow-up in clinic in the early summer to see how she is feeling and if she would like to have the procedure done at that time.   The patient denies chest pain, chest pressure, dyspnea at rest or with exertion, PND, orthopnea, or leg swelling. Denies cough, fever, chills. Denies nausea, vomiting. Denies syncope or presyncope. Denies dizziness or lightheadedness. Denies snoring.  Past Medical History:  Diagnosis Date   Hypothyroidism    Skin lesion of neck    Anterior Neck (Thyroid Tissue)   Thyroid disease     Past Surgical History:  Procedure Laterality Date   ANAL FISSURE REPAIR     broken elbow     pins   ELBOW SURGERY Right    EYE SURGERY     bilateral cataracts    FOOT SURGERY Left    LESION REMOVAL N/A 10/11/2017   Procedure: EXCISION ANTERIOR NECK SKIN LESION ERAS PATHWAY;  Surgeon: Coralie Keens, MD;  Location: [place];  Service: General;  Laterality: N/A;   needle removed from foot     THYROID SURGERY     TONSILLECTOMY     TUBAL LIGATION      Current Medications: No outpatient medications have been marked as taking for the 11/24/21 encounter (Appointment) with Freada Bergeron, MD.     Allergies:   Bee venom   Social History   Socioeconomic History   Marital status: Married    Spouse name: Not on file  Number of children: Not on file   Years of education: Not on file   Highest education level: Not on file  Occupational History   Not on file  Tobacco Use   Smoking status: Former   Smokeless tobacco: Never  Vaping Use   Vaping Use: Never used  Substance and Sexual Activity   Alcohol use: Never   Drug use: Never   Sexual activity: Not on file  Other Topics Concern   Not on file  Social History Narrative   ** Merged History Encounter **       Social Determinants of Health   Financial Resource Strain: Not on file  Food Insecurity: Not on  file  Transportation Needs: Not on file  Physical Activity: Not on file  Stress: Not on file  Social Connections: Not on file     Family History: The patient's family history includes Heart disease in her father; Stomach cancer in her paternal grandmother. There is no history of Colon cancer or Pancreatic cancer.  ROS:   Please see the history of present illness.    Review of Systems  Constitutional:  Negative for chills and fever.  HENT:  Negative for nosebleeds.   Eyes:  Positive for pain (Dry eyes). Negative for blurred vision and photophobia.  Respiratory:  Negative for shortness of breath.   Cardiovascular:  Positive for palpitations. Negative for chest pain, orthopnea, claudication, leg swelling and PND.  Gastrointestinal:  Negative for melena, nausea and vomiting.  Genitourinary:  Negative for dysuria and hematuria.  Musculoskeletal:  Negative for falls and myalgias.  Neurological:  Negative for dizziness, focal weakness, seizures and loss of consciousness.  Endo/Heme/Allergies:  Negative for polydipsia.  Psychiatric/Behavioral:  Negative for substance abuse. The patient is not nervous/anxious.     EKGs/Labs/Other Studies Reviewed:    The following studies were reviewed today:  ECHOCARDIOGRAM 08/25/20 EF 60-65, no RWMA, GRII DD, normal RVSF, RVSP 33.7, mild LAE, trivial MR, mild AV sclerosis without stenosis   CT CARDIAC SCORING (SELF PAY ONLY) 08/18/2020 FINDINGS: Non-cardiac: See separate report from [facility]. Ascending aorta: Normal diameter 3.3 cm Pericardium: Normal Coronary arteries: No calcium noted IMPRESSION: Coronary calcium score of 0. Aortic Atherosclerosis (ICD10-I70.0). CT abd/pelvis 03/31/20: IMPRESSION: 1. Mild diffuse stranding of the mediastinal fat surrounding the aorta and pulmonary arteries may represent edema or contusion. No extravascular contrast to suggest active arterial bleed. No fluid collection or hematoma. No other  acute/traumatic intrathoracic, abdominal, or pelvic pathology. 2. 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 No bowel obstruction. Normal appendix. 3. Mixing artifact versus less likely DVT in the common femoral veins bilaterally. Duplex ultrasound may provide better evaluation if there is high clinical concern for acute DVT. 4. Aortic Atherosclerosis (ICD10-I70.0).  Cardiac Monitor 05/13/20: Patient's monitoring period was from 04/14/2020-05/07/2020 Predominant rhythm was normal sinus rhythm with HR 63bpm with minimum HR 53bpm and maximum HR 147bpm There was one episode of SVT lasting 23 seconds that correlated with patient triggered event, but no other significant arrhythmias Rare PVCs, rare PACs No atrial fibrillation, VT or significant pauses.   EKG: 05/24/21: Sinus rhythm, rate 64 bpm  Recent Labs: 01/19/2021: ALT 25 03/09/2021: BUN 16; Creatinine, Ser 0.55; Hemoglobin 12.9; Platelets 335; Potassium 4.5; Sodium 136  Recent Lipid Panel No results found for: "CHOL", "TRIG", "HDL", "CHOLHDL", "VLDL", "LDLCALC", "LDLDIRECT"   Physical Exam:    VS:  There were no vitals taken for this visit.    Wt Readings from Last 3 Encounters:  05/24/21 142  lb 3.2 oz (64.5 kg)  03/09/21 140 lb (63.5 kg)  01/26/21 136 lb (61.7 kg)     GEN:  Well nourished, well developed in no acute distress HEENT: Normal NECK: No JVD; No carotid bruits CARDIAC: RRR, no murmurs, rubs, gallops RESPIRATORY:  Clear to auscultation without rales, wheezing or rhonchi  ABDOMEN: Soft, non-tender, non-distended MUSCULOSKELETAL:  No edema; No deformity  SKIN: Warm and dry NEUROLOGIC:  Alert and oriented x 3 PSYCHIATRIC:  Normal affect   ASSESSMENT:    No diagnosis found.   PLAN:    In order of problems listed above:  #Atrial Flutter: Patient with presentation to [facility] ED in 12/2020 with chest pain and palpitations found to be in Aflutter with HR 150s. Received IV dilt with conversion. Has since  been on xarelto for [facility] and metoprolol '25mg'$  XL daily. She saw Dr. Quentin Ore and was planned for ablation, but she would like to think this over further before proceeding. Will see me back in June to discuss. Currently, symptoms are better controlled since lowering the dose of her synthroid. -Patient would like to postpone ablation at this time and re-discuss in the summer -Continue metop '25mg'$  XL daily -Continue xarelto  #Fall with Headstrike #Possible Syncope: Patient with presentation to ED in 03/2020 after being found down with head hematoma at [facility]. She thought she may have tripped and fallen, but could not clearly recall the series of events. She  denied any preceding chest pain, palpitations, SOB, nausea or diaphoresis. TTE with normal EF, no valve disease. Ca score 0 (did not want to pursue ischemic testing at that time). Notably, she has newly diagnosed Aflutter, but she is symptomatic with it and she had no preceding symptoms at that time. Fortunately, she has had no further episodes of syncope since that time. -Manage Aflutter as above -Continue metop '25mg'$  XL daily  #Elevated LDL: Ca score 0 in 07/2020. Declined statin therapy.    Medication Adjustments/Labs and Tests Ordered: Current medicines are reviewed at length with the patient today.  Concerns regarding medicines are outlined above.  No orders of the defined types were placed in this encounter.  No orders of the defined types were placed in this encounter.   There are no Patient Instructions on file for this visit.  I,Mykaella Javier,acting as a scribe for Freada Bergeron, MD.,have documented all relevant documentation on the behalf of Freada Bergeron, MD,as directed by  Freada Bergeron, MD while in the presence of Freada Bergeron, MD.  I, Freada Bergeron, MD, have reviewed all documentation for this visit. The documentation on 11/19/21 for the exam, diagnosis, procedures, and orders are all  accurate and complete.   Signed, Freada Bergeron, MD  11/19/2021 10:40 AM    [place]

## 2021-11-24 ENCOUNTER — Ambulatory Visit: Payer: Medicare HMO | Admitting: Cardiology

## 2021-11-24 ENCOUNTER — Encounter: Payer: Self-pay | Admitting: Cardiology

## 2021-11-24 VITALS — BP 130/80 | HR 55 | Ht 63.5 in | Wt 137.4 lb

## 2021-11-24 DIAGNOSIS — I483 Typical atrial flutter: Secondary | ICD-10-CM | POA: Diagnosis not present

## 2021-11-24 DIAGNOSIS — E039 Hypothyroidism, unspecified: Secondary | ICD-10-CM | POA: Diagnosis not present

## 2021-11-24 DIAGNOSIS — E7800 Pure hypercholesterolemia, unspecified: Secondary | ICD-10-CM

## 2021-11-24 DIAGNOSIS — R55 Syncope and collapse: Secondary | ICD-10-CM

## 2021-11-24 MED ORDER — METOPROLOL SUCCINATE ER 25 MG PO TB24: 12.5000 mg | ORAL_TABLET | Freq: Every day | ORAL | 3 refills | Status: DC

## 2021-11-24 NOTE — Progress Notes (Signed)
Cardiology Office Note:    Date:  11/24/2021   ID:  Tammy [name], DOB [birth year], MRN [id number]  PCP:  Shon Baton, MD  [facility] Cardiologist:  Freada Bergeron, MD  [facility] Electrophysiologist:  Vickie Epley, MD   Referring MD: Shon Baton, MD    History of Present Illness:    Tammy [name] is a 81 y.o. female with a hx of hypothyroidism, Aflutter on xarelto, and episode of syncope who now presents to clinic for follow-up.  Patient was initially seen after episode of syncope. Specifically, she presented to [facility] on 03/31/30 for signs of head trauma after being found down [facility]. On presentation there, she was notably confused with a scalp hematoma. She does not recall tripping or falling or any presyncopal symptoms. CT head confirmed scalp hematoma but fortunately no other acute pathology. Cardiac monitor from 04/14/20-05/07/20 revealed NSR with 1 episode of SVT lasting 23 seconds which was symptomatic. She was referred to Cardiology clinic for further management.  Was seen in clinic on 06/2020 following the episode. TTE 07/2020 with normal EF, no significant valve disease. Coronary Ca score 07/2020 0. She then presented to the ER on 12/2020 for palpitations and chest pain found to be in aflutter with HR 150s. She received IV dilt and converted to NSR. Followed up with Dr. Quentin Ore on 03/2021 and was planned for Aflutter ablation, but she decided to defer at that time.  Was last seen in clinic on 05/2021 where she was doing well from a CV standpoint. Had rare palpitations. Wanted to think about ablation further prior to pursing as she was worried about the effects of anesthesia.  Today, she is accompanied by a family member. The patient states that she is grieving the loss of her husband, who passed away in November 12, 2022.   About 2 or 3 times she felt like her atrial fibrillation may be returning. This was usually during stress, and almost felt like a panic attack. At  those times she does not believe she actually reverted to Afib, her symptoms resolved quickly with deep breathing. No chest pain, lightheadedness, SOB or syncope.  She is trying to walk for exercise, but is feeling limited by fatigue. She has noticed being unable to walk as long as she used to. She thinks her metoprolol may be the cause of her fatigue and she is wondering if she can decrease the dose.  She denies any chest pain, shortness of breath, or peripheral edema. No lightheadedness, headaches, syncope, orthopnea, or PND.   Past Medical History:  Diagnosis Date   Hypothyroidism    Skin lesion of neck    Anterior Neck (Thyroid Tissue)   Thyroid disease     Past Surgical History:  Procedure Laterality Date   ANAL FISSURE REPAIR     broken elbow     pins   ELBOW SURGERY Right    EYE SURGERY     bilateral cataracts    FOOT SURGERY Left    LESION REMOVAL N/A 10/11/2017   Procedure: EXCISION ANTERIOR NECK SKIN LESION ERAS PATHWAY;  Surgeon: Coralie Keens, MD;  Location: [facility];  Service: General;  Laterality: N/A;   needle removed from foot     THYROID SURGERY     TONSILLECTOMY     TUBAL LIGATION      Current Medications: Current Meds  Medication Sig   ascorbic acid (VITAMIN C) 500 MG tablet Take 500 mg by mouth as needed.   Cholecalciferol (VITAMIN D3)  2000 units TABS Take 2,000 Units by mouth as needed.   EPINEPHrine 0.3 mg/0.3 mL IJ SOAJ injection Inject 0.3 mg into the muscle daily as needed (for anaphylatic reactions (BEE STINGS)). For bee stings   levothyroxine (SYNTHROID, LEVOTHROID) 125 MCG tablet Take 125 mcg by mouth daily before breakfast.   Magnesium 500 MG TABS Take 500 mg by mouth as needed.   metoprolol succinate (TOPROL XL) 25 MG 24 hr tablet Take 0.5 tablets (12.5 mg total) by mouth daily.   Multiple Vitamin (MULTIVITAMIN WITH MINERALS) TABS tablet Take 1 tablet by mouth daily.   rivaroxaban (XARELTO) 20 MG TABS tablet Take 1 tablet (20 mg total) by mouth  daily with supper.   [DISCONTINUED] metoprolol succinate (TOPROL XL) 25 MG 24 hr tablet Take 1 tablet (25 mg total) by mouth daily.     Allergies:   Bee venom   Social History   Socioeconomic History   Marital status: Married    Spouse name: Not on file   Number of children: Not on file   Years of education: Not on file   Highest education level: Not on file  Occupational History   Not on file  Tobacco Use   Smoking status: Former   Smokeless tobacco: Never  Vaping Use   Vaping Use: Never used  Substance and Sexual Activity   Alcohol use: Never   Drug use: Never   Sexual activity: Not on file  Other Topics Concern   Not on file  Social History Narrative   ** Merged History Encounter **       Social Determinants of Health   Financial Resource Strain: Not on file  Food Insecurity: Not on file  Transportation Needs: Not on file  Physical Activity: Not on file  Stress: Not on file  Social Connections: Not on file     Family History: The patient's family history includes Heart disease in her father; Stomach cancer in her paternal grandmother. There is no history of Colon cancer or Pancreatic cancer.  ROS:   Please see the history of present illness.    Review of Systems  Constitutional:  Positive for malaise/fatigue. Negative for chills and fever.  HENT:  Negative for nosebleeds.   Eyes:  Negative for blurred vision and photophobia.  Respiratory:  Negative for shortness of breath.   Cardiovascular:  Positive for palpitations. Negative for chest pain, orthopnea, claudication, leg swelling and PND.  Gastrointestinal:  Negative for melena, nausea and vomiting.  Genitourinary:  Negative for dysuria and hematuria.  Musculoskeletal:  Negative for falls and myalgias.  Neurological:  Negative for dizziness, focal weakness, seizures and loss of consciousness.  Endo/Heme/Allergies:  Negative for polydipsia.  Psychiatric/Behavioral:  Negative for substance abuse. The patient  is not nervous/anxious.     EKGs/Labs/Other Studies Reviewed:    The following studies were reviewed today:  ECHOCARDIOGRAM 08/25/20 EF 60-65, no RWMA, GRII DD, normal RVSF, RVSP 33.7, mild LAE, trivial MR, mild AV sclerosis without stenosis   CT CARDIAC SCORING (SELF PAY ONLY) 08/18/2020 FINDINGS: Non-cardiac: See separate report from [facility]. Ascending aorta: Normal diameter 3.3 cm Pericardium: Normal Coronary arteries: No calcium noted IMPRESSION: Coronary calcium score of 0. Aortic Atherosclerosis (ICD10-I70.0).  CT abd/pelvis 03/31/20: IMPRESSION: 1. Mild diffuse stranding of the mediastinal fat surrounding the aorta and pulmonary arteries may represent edema or contusion. No extravascular contrast to suggest active arterial bleed. No fluid collection or hematoma. No other acute/traumatic intrathoracic, abdominal, or pelvic pathology. 2. 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 No bowel obstruction. Normal appendix. 3. Mixing artifact versus less likely DVT in the common femoral veins bilaterally. Duplex ultrasound may provide better evaluation if there is high clinical concern for acute DVT. 4. Aortic Atherosclerosis (ICD10-I70.0).  Cardiac Monitor 05/13/20: Patient's monitoring period was from 04/14/2020-05/07/2020 Predominant rhythm was normal sinus rhythm with HR 63bpm with minimum HR 53bpm and maximum HR 147bpm There was one episode of SVT lasting 23 seconds that correlated with patient triggered event, but no other significant arrhythmias Rare PVCs, rare PACs No atrial fibrillation, VT or significant pauses.   EKG:  EKG is personally reviewed. 11/24/2021:  EKG was not ordered. 05/24/21: Sinus rhythm, rate 64 bpm  Recent Labs: 01/19/2021: ALT 25 03/09/2021: BUN 16; Creatinine, Ser 0.55; Hemoglobin 12.9; Platelets 335; Potassium 4.5; Sodium 136   Recent Lipid Panel No results found for: "CHOL", "TRIG", "HDL", "CHOLHDL", "VLDL", "LDLCALC",  "LDLDIRECT"   Physical Exam:    VS:  BP 130/80   Pulse (!) 55   Ht 5' 3.5" (1.613 m)   Wt 137 lb 6.4 oz (62.3 kg)   SpO2 99%   BMI 23.96 kg/m     Wt Readings from Last 3 Encounters:  11/24/21 137 lb 6.4 oz (62.3 kg)  05/24/21 142 lb 3.2 oz (64.5 kg)  03/09/21 140 lb (63.5 kg)     GEN:  Well nourished, well developed in no acute distress HEENT: Normal NECK: No JVD; No carotid bruits CARDIAC: RRR, no murmurs, rubs, gallops RESPIRATORY:  Clear to auscultation without rales, wheezing or rhonchi  ABDOMEN: Soft, non-tender, non-distended MUSCULOSKELETAL:  No edema; No deformity  SKIN: Warm and dry NEUROLOGIC:  Alert and oriented x 3 PSYCHIATRIC:  Normal affect   ASSESSMENT:    1. Typical atrial flutter ([place])   2. Syncope, unspecified syncope type   3. Hypothyroidism, unspecified type   4. Elevated LDL cholesterol level      PLAN:    In order of problems listed above:  #Atrial Flutter: Patient with presentation to [facility] ED in 12/2020 with chest pain and palpitations found to be in Aflutter with HR 150s. Received IV dilt with conversion. Has since been on xarelto for [facility] and metoprolol '25mg'$  XL daily. She saw Dr. Quentin Ore and was planned for ablation, but she would like to think this over further before proceeding. She continues to decline ablation at this time. Will continue with medical management. -Patient would like to postpone ablation at this time -Decrease metop to 12.'5mg'$  XL daily due to fatigue -Continue xarelto  #Fall with Headstrike #Possible Syncope: Patient with presentation to ED in 03/2020 after being found down with head hematoma at [facility]. She thought she may have tripped and fallen, but could not clearly recall the series of events. She  denied any preceding chest pain, palpitations, SOB, nausea or diaphoresis. TTE with normal EF, no valve disease. Ca score 0 (did not want to pursue ischemic testing at that time). She was subsequently diagnosed with  Aflutter, but she is symptomatic with it and she had no preceding symptoms at that time. Fortunately, no recurrence. -Manage Aflutter as above  #Elevated LDL: Ca score 0 in 07/2020. Declined statin therapy.  Follow-up:  6 months   Medication Adjustments/Labs and Tests Ordered: Current medicines are reviewed at length with the patient today.  Concerns regarding medicines are outlined above.   No orders of the defined types were placed in this encounter.  Meds ordered this encounter  Medications   metoprolol succinate (TOPROL XL)  25 MG 24 hr tablet    Sig: Take 0.5 tablets (12.5 mg total) by mouth daily.    Dispense:  45 tablet    Refill:  3    DOSE DECREASE   Patient Instructions  Medication Instructions:   DECREASE YOUR METOPROLOL SUCCINATE (TOPROL XL) TO 12.5 MG BY MOUTH DAILY  *If you need a refill on your cardiac medications before your next appointment, please call your pharmacy*    Follow-Up: At [facility], you and your health needs are our priority.  As part of our continuing mission to provide you with exceptional heart care, we have created designated Provider Care Teams.  These Care Teams include your primary Cardiologist (physician) and Advanced Practice Providers (APPs -  Physician Assistants and Nurse Practitioners) who all work together to provide you with the care you need, when you need it.  We recommend signing up for the patient portal called "MyChart".  Sign up information is provided on this After Visit Summary.  MyChart is used to connect with patients for Virtual Visits (Telemedicine).  Patients are able to view lab/test results, encounter notes, upcoming appointments, etc.  Non-urgent messages can be sent to your provider as well.   To learn more about what you can do with MyChart, go to [email or web address].    Your next appointment:   6 month(s)  The format for your next appointment:   In Person  Provider:   Freada Bergeron, MD OR AN  EXTENDER    Important Information About Sugar        I,Mathew Stumpf,acting as a scribe for Freada Bergeron, MD.,have documented all relevant documentation on the behalf of Freada Bergeron, MD,as directed by  Freada Bergeron, MD while in the presence of Freada Bergeron, MD.  I, Freada Bergeron, MD, have reviewed all documentation for this visit. The documentation on 11/24/21 for the exam, diagnosis, procedures, and orders are all accurate and complete.   Signed, Freada Bergeron, MD  11/24/2021 5:28 PM    [place]

## 2021-11-24 NOTE — Patient Instructions (Signed)
Medication Instructions:   DECREASE YOUR METOPROLOL SUCCINATE (TOPROL XL) TO 12.5 MG BY MOUTH DAILY  *If you need a refill on your cardiac medications before your next appointment, please call your pharmacy*    Follow-Up: At [facility], you and your health needs are our priority.  As part of our continuing mission to provide you with exceptional heart care, we have created designated Provider Care Teams.  These Care Teams include your primary Cardiologist (physician) and Advanced Practice Providers (APPs -  Physician Assistants and Nurse Practitioners) who all work together to provide you with the care you need, when you need it.  We recommend signing up for the patient portal called "MyChart".  Sign up information is provided on this After Visit Summary.  MyChart is used to connect with patients for Virtual Visits (Telemedicine).  Patients are able to view lab/test results, encounter notes, upcoming appointments, etc.  Non-urgent messages can be sent to your provider as well.   To learn more about what you can do with MyChart, go to [email or web address].    Your next appointment:   6 month(s)  The format for your next appointment:   In Person  Provider:   Freada Bergeron, MD OR AN EXTENDER    Important Information About Sugar

## 2021-12-20 ENCOUNTER — Telehealth: Payer: Self-pay | Admitting: Cardiology

## 2021-12-20 NOTE — Telephone Encounter (Signed)
Will send this message to our Prior Auth Nurse to further assist the pt with getting the cost of this medication down or help her with signing up for pt assistance for this medication.   Prior Auth Nurse to follow-up with the pt accordingly, when she returns to the office.

## 2021-12-20 NOTE — Telephone Encounter (Signed)
Pt c/o medication issue:  1. Name of Medication:  rivaroxaban (XARELTO) 20 MG TABS tablet     2. How are you currently taking this medication (dosage and times per day)?   3. Are you having a reaction (difficulty breathing--STAT)?   4. What is your medication issue? Pt called stating that this medication is $300 for 2 month and would like to know if there are any alternatives. Please advise.

## 2021-12-21 NOTE — Telephone Encounter (Signed)
**Note De-identified Majid Mccravy Obfuscation** No answer. LMTCB

## 2021-12-21 NOTE — Telephone Encounter (Signed)
Pt returning a call.

## 2021-12-22 NOTE — Telephone Encounter (Signed)
**Note De-Identified Fama Muenchow Obfuscation** The pt and I discussed [employer] Xarelto discount program and she is interested in enrolling into this program as she is aware that she can get a 30 day supply of Xarelto for $85 or a 90 day supply for $240.  I gave her their phone number and she states that she is calling them today. She currently has a months worth of Xarelto on hand.

## 2021-12-24 ENCOUNTER — Encounter: Payer: Self-pay | Admitting: Cardiology

## 2021-12-24 ENCOUNTER — Telehealth: Payer: Self-pay

## 2021-12-24 NOTE — Telephone Encounter (Signed)
Called patient to follow up on My Chart question. Patient states she had 2 episodes of  palpitations that woke her up last week. She has decreased metoprolol to 12.'5mg'$  daily but can take extra does when needed. She used vagal exercises, breathing to help with symptoms. She wanted Dr. Johney Frame to be aware.  Advised patient to continue using exercises and breathing to help during these episodes. She can take extra doses of metoprolol when needed. Discussed stress and anxiety as well. She verbalized understanding. Will forward her concerns to Dr. Johney Frame for review.

## 2021-12-30 ENCOUNTER — Encounter: Payer: Self-pay | Admitting: Cardiology

## 2022-01-06 DIAGNOSIS — I483 Typical atrial flutter: Secondary | ICD-10-CM | POA: Diagnosis not present

## 2022-01-06 DIAGNOSIS — E559 Vitamin D deficiency, unspecified: Secondary | ICD-10-CM | POA: Diagnosis not present

## 2022-01-06 DIAGNOSIS — E785 Hyperlipidemia, unspecified: Secondary | ICD-10-CM | POA: Diagnosis not present

## 2022-01-06 DIAGNOSIS — E871 Hypo-osmolality and hyponatremia: Secondary | ICD-10-CM | POA: Diagnosis not present

## 2022-01-06 DIAGNOSIS — M810 Age-related osteoporosis without current pathological fracture: Secondary | ICD-10-CM | POA: Diagnosis not present

## 2022-01-06 DIAGNOSIS — F418 Other specified anxiety disorders: Secondary | ICD-10-CM | POA: Diagnosis not present

## 2022-01-06 DIAGNOSIS — E039 Hypothyroidism, unspecified: Secondary | ICD-10-CM | POA: Diagnosis not present

## 2022-01-06 DIAGNOSIS — I700 Atherosclerosis of aorta: Secondary | ICD-10-CM | POA: Diagnosis not present

## 2022-01-06 DIAGNOSIS — C73 Malignant neoplasm of thyroid gland: Secondary | ICD-10-CM | POA: Diagnosis not present

## 2022-01-12 ENCOUNTER — Encounter: Payer: Self-pay | Admitting: Cardiology

## 2022-01-16 NOTE — Progress Notes (Addendum)
Office Visit    Patient Name: Tammy [name] Date of Encounter: 01/16/2022  Primary Care Provider:  Shon Baton, MD Primary Cardiologist:  Freada Bergeron, MD Primary Electrophysiologist: Vickie Epley, MD  Chief Complaint    Tammy [name] is a 81 y.o. female with PMH of syncope, hypothyroidism, HLD, atrial flutter, aortic atherosclerosis who presents today for complaint of abnormal heart rate.  Past Medical History    Past Medical History:  Diagnosis Date   Hypothyroidism    Skin lesion of neck    Anterior Neck (Thyroid Tissue)   Thyroid disease    Past Surgical History:  Procedure Laterality Date   ANAL FISSURE REPAIR     broken elbow     pins   ELBOW SURGERY Right    EYE SURGERY     bilateral cataracts    FOOT SURGERY Left    LESION REMOVAL N/A 10/11/2017   Procedure: EXCISION ANTERIOR NECK SKIN LESION ERAS PATHWAY;  Surgeon: Coralie Keens, MD;  Location: [place];  Service: General;  Laterality: N/A;   needle removed from foot     THYROID SURGERY     TONSILLECTOMY     TUBAL LIGATION      Allergies  Allergies  Allergen Reactions   Bee Venom Hives and Other (See Comments)    Carries Epi-pen    History of Present Illness    Tammy [name]  is a 81 year old female with the above mention past medical history who presents today for complaint of abnormal heart rate.  [name] was initially seen by Dr. Johney Frame in 06/2020 for complaint and admission of syncope.  Event monitor was placed and demonstrated 1 run of SVT lasting 23 seconds with no pauses or ventricular tachycardia..  2D echo was completed showing EF of 60/65%, no RWMA, grade 2 DD, normal RV SF, mild LAE, trivial MR, mild AV sclerosis without stenosis.  Coronary calcium score obtained to rule out ischemic work-up that showed no calcium and aortic atherosclerosis.  She presented to the emergency room 01/27/2021 with palpitations and chest pain.  She was placed on Cardizem drip and converted to  sinus rhythm with rate of 80.  EKG in the ED showed typical atrial flutter.  She was placed on Xarelto 20 mg daily and rate control with Toprol-XL 25 mg.  She was referred to EP for further evaluation.  Antiarrhythmic medications and catheter ablation were discussed and patient elected rhythm control versus  catheter ablation at that time.    She was seen in follow-up on 05/2021 patient had reservations about proceeding with ablation due to possible risk of developing dementia with anesthesia.  She decided to follow-up later in the summer to further discuss.  She was seen 2021/11/17 following the death of her husband.  She reported 2-3 episodes of possible atrial fibrillation returning. Try to increase physical activity but feels because of fatigue and was wanting to decrease dosage. Metoprolol XL was decreased to 12.5 mg.  [name] reached out to [facility] by MyChart message with complaint of increased palpitations. She was advised to increase Toprol-XL 12.5 mg as needed for increased palpitations.  She contacted our office 4 days ago with complaint of increased palpitations that awoke her from sleep.  She presents today for further evaluation of increased palpitations.  [name] presents today for follow-up of recent palpitations.  Since last being seen in the office patient reports she has had an occasional episode while sleeping that feels  like her heart is flipping in her chest.  She denies any dizziness or shortness of breath with these palpitations.  She is currently tolerating the increased dose of Toprol without any medication side effects.  Her blood pressure today was well controlled at 130/68 and her heart rate was 62.  On palpitation and auscultation patient had regular rhythm.  She reported that she recently lost her husband and has been under tremendous amount of stress.  I offered my condolences for her loss.  We discussed treatment plan moving forward and also discussed pathophysiology related to  atrial fibrillation and supraventricular arrhythmias.  Patient denies chest pain, palpitations, dyspnea, PND, orthopnea, nausea, vomiting, dizziness, syncope, edema, weight gain, or early satiety.  Home Medications    Current Outpatient Medications  Medication Sig Dispense Refill   ascorbic acid (VITAMIN C) 500 MG tablet Take 500 mg by mouth as needed.     Cholecalciferol (VITAMIN D3) 2000 units TABS Take 2,000 Units by mouth as needed.     EPINEPHrine 0.3 mg/0.3 mL IJ SOAJ injection Inject 0.3 mg into the muscle daily as needed (for anaphylatic reactions (BEE STINGS)). For bee stings     levothyroxine (SYNTHROID, LEVOTHROID) 125 MCG tablet Take 125 mcg by mouth daily before breakfast.     Magnesium 500 MG TABS Take 500 mg by mouth as needed.     metoprolol succinate (TOPROL XL) 25 MG 24 hr tablet Take 0.5 tablets (12.5 mg total) by mouth daily. 45 tablet 3   Multiple Vitamin (MULTIVITAMIN WITH MINERALS) TABS tablet Take 1 tablet by mouth daily.     rivaroxaban (XARELTO) 20 MG TABS tablet Take 1 tablet (20 mg total) by mouth daily with supper. 30 tablet 11   No current facility-administered medications for this visit.     Review of Systems  Please see the history of present illness.    (+) Palpitations (+) Increased stress  All other systems reviewed and are otherwise negative except as noted above.  Physical Exam    Wt Readings from Last 3 Encounters:  11/24/21 137 lb 6.4 oz (62.3 kg)  05/24/21 142 lb 3.2 oz (64.5 kg)  03/09/21 140 lb (63.5 kg)   WC:BJSEG were no vitals filed for this visit.,There is no height or weight on file to calculate BMI.  Constitutional:      Appearance: Healthy appearance. Not in distress.  Neck:     Vascular: JVD normal.  Pulmonary:     Effort: Pulmonary effort is normal.     Breath sounds: No wheezing. No rales. Diminished in the bases Cardiovascular:     Normal rate. Regular rhythm. Normal S1. Normal S2.      Murmurs: There is no murmur.   Edema:    Peripheral edema absent.  Abdominal:     Palpations: Abdomen is soft non tender. There is no hepatomegaly.  Skin:    General: Skin is warm and dry.  Neurological:     General: No focal deficit present.     Mental Status: Alert and oriented to person, place and time.     Cranial Nerves: Cranial nerves are intact.  EKG/LABS/Other Studies Reviewed    ECG personally reviewed by me today -none completed today     CHA2DS2-VASc Score = 3 [CHF History: 0, HTN History: 0, Diabetes History: 0, Stroke History: 0, Vascular Disease History: 0, Age Score: 2, Gender Score: 1].  Therefore, the patient's annual risk of stroke is 3.2 %.      Lab Results  Component Value Date   WBC 6.8 03/09/2021   HGB 12.9 03/09/2021   HCT 37.0 03/09/2021   MCV 88 03/09/2021   PLT 335 03/09/2021   Lab Results  Component Value Date   CREATININE 0.55 (L) 03/09/2021   BUN 16 03/09/2021   NA 136 03/09/2021   K 4.5 03/09/2021   CL 100 03/09/2021   CO2 22 03/09/2021   Lab Results  Component Value Date   ALT 25 01/19/2021   AST 21 01/19/2021   ALKPHOS 82 01/19/2021   BILITOT 0.4 01/19/2021   No results found for: "CHOL", "HDL", "LDLCALC", "LDLDIRECT", "TRIG", "CHOLHDL"  No results found for: "HGBA1C"  Assessment & Plan    1.  Paroxysmal atrial flutter: -Patient has noticed increased palpitations over the last few weeks have not been relieved with 25 mg Toprol-XL -She reports today that she has felt occasional palpitations while sleeping. -We will obtain 7-day ZIO monitor to evaluate burden of palpitations. -Patient will continue Toprol XL 25 mg with as needed dose of 12.5 for increased palpitations. -Patient encouraged to avoid triggers such as excess caffeine, emotional upset, alcohol, and energy drinks -Creatinine clearance is 64.12  -Current dose of Xarelto 20 mg daily appropriate -Patient has normal electrolytes per recent labs at PCP -CHA2DS2-VASc Score = 3 [CHF History: 0, HTN History:  0, Diabetes History: 0, Stroke History: 0, Vascular Disease History: 0, Age Score: 2, Gender Score: 1].  Therefore, the patient's annual risk of stroke is 3.2 %.      2.  History of syncope: -Today patient reports dizziness shortness of breath palpitations  3.  Hyperlipidemia: -We will obtain most recent lab work from PCP office regarding cholesterol numbers  4.  Hypothyroidism: -Continue treatment plan per PCP    Disposition: Follow-up with Freada Bergeron, MD or APP in 4 weeks    Medication Adjustments/Labs and Tests Ordered: Current medicines are reviewed at length with the patient today.  Concerns regarding medicines are outlined above.   Signed, Mable Fill, Marissa Nestle, NP 01/16/2022, 7:39 AM [place]

## 2022-01-17 ENCOUNTER — Ambulatory Visit (INDEPENDENT_AMBULATORY_CARE_PROVIDER_SITE_OTHER): Payer: Medicare HMO

## 2022-01-17 ENCOUNTER — Ambulatory Visit: Payer: Medicare HMO | Attending: Nurse Practitioner | Admitting: Nurse Practitioner

## 2022-01-17 ENCOUNTER — Encounter: Payer: Self-pay | Admitting: Nurse Practitioner

## 2022-01-17 VITALS — BP 130/68 | HR 62 | Ht 63.0 in | Wt 138.6 lb

## 2022-01-17 DIAGNOSIS — I480 Paroxysmal atrial fibrillation: Secondary | ICD-10-CM

## 2022-01-17 DIAGNOSIS — E7800 Pure hypercholesterolemia, unspecified: Secondary | ICD-10-CM | POA: Diagnosis not present

## 2022-01-17 DIAGNOSIS — E039 Hypothyroidism, unspecified: Secondary | ICD-10-CM

## 2022-01-17 DIAGNOSIS — R55 Syncope and collapse: Secondary | ICD-10-CM | POA: Diagnosis not present

## 2022-01-17 MED ORDER — RIVAROXABAN 20 MG PO TABS: 20.0000 mg | ORAL_TABLET | Freq: Every day | ORAL | 11 refills | Status: DC

## 2022-01-17 MED ORDER — METOPROLOL SUCCINATE ER 25 MG PO TB24: 25.0000 mg | ORAL_TABLET | Freq: Every day | ORAL | 3 refills | Status: DC

## 2022-01-17 NOTE — Patient Instructions (Addendum)
Medication Instructions:  Your physician recommends that you continue on your current medications as directed. Please refer to the Current Medication list given to you today.  *If you need a refill on your cardiac medications before your next appointment, please call your pharmacy*   Lab Work: None ordered  If you have labs (blood work) drawn today and your tests are completely normal, you will receive your results only by: [place] (if you have MyChart) OR A paper copy in the mail If you have any lab test that is abnormal or we need to change your treatment, we will call you to review the results.   Testing/Procedures: [place]- Long Term Monitor Instructions  Your physician has requested you wear a ZIO patch monitor for 7 days.  This is a single patch monitor. [employer] supplies one patch monitor per enrollment. Additional stickers are not available. Please do not apply patch if you will be having a Nuclear Stress Test,  Echocardiogram, Cardiac CT, MRI, or Chest Xray during the period you would be wearing the  monitor. The patch cannot be worn during these tests. You cannot remove and re-apply the  ZIO XT patch monitor.  Your ZIO patch monitor will be mailed 3 day USPS to your address on file. It may take 3-5 days  to receive your monitor after you have been enrolled.  Once you have received your monitor, please review the enclosed instructions. Your monitor  has already been registered assigning a specific monitor serial # to you.  Billing and Patient Assistance Program Information  We have supplied [employer] with any of your insurance information on file for billing purposes. [employer] offers a sliding scale Patient Assistance Program for patients that do not have  insurance, or whose insurance does not completely cover the cost of the ZIO monitor.  You must apply for the Patient Assistance Program to qualify for this discounted rate.  To apply, please call [employer] at  [phone], select option 4, select option 2, ask to apply for  Patient Assistance Program. Theodore Demark will ask your household income, and how many people  are in your household. They will quote your out-of-pocket cost based on that information.  [employer] will also be able to set up a 38-month interest-free payment plan if needed.  Applying the monitor   Shave hair from upper left chest.  Hold abrader disc by orange tab. Rub abrader in 40 strokes over the upper left chest as  indicated in your monitor instructions.  Clean area with 4 enclosed alcohol pads. Let dry.  Apply patch as indicated in monitor instructions. Patch will be placed under collarbone on left  side of chest with arrow pointing upward.  Rub patch adhesive wings for 2 minutes. Remove white label marked "1". Remove the white  label marked "2". Rub patch adhesive wings for 2 additional minutes.  While looking in a mirror, press and release button in center of patch. A small green light will  flash 3-4 times. This will be your only indicator that the monitor has been turned on.  Do not shower for the first 24 hours. You may shower after the first 24 hours.  Press the button if you feel a symptom. You will hear a small click. Record Date, Time and  Symptom in the Patient Logbook.  When you are ready to remove the patch, follow instructions on the last 2 pages of Patient  Logbook. Stick patch monitor onto the last page of Patient Logbook.  Place Patient  Logbook in the blue and white box. Use locking tab on box and tape box closed  securely. The blue and white box has prepaid postage on it. Please place it in the mailbox as  soon as possible. Your physician should have your test results approximately 7 days after the  monitor has been mailed back to [facility].  Call [employer] at [phone] if you have questions regarding  your ZIO XT patch monitor. Call them immediately if you see an orange light  blinking on your  monitor.  If your monitor falls off in less than 4 days, contact our Monitor department at [phone].  If your monitor becomes loose or falls off after 4 days call [employer] at [phone] for  suggestions on securing your monitor    Follow-Up: At [facility], you and your health needs are our priority.  As part of our continuing mission to provide you with exceptional heart care, we have created designated Provider Care Teams.  These Care Teams include your primary Cardiologist (physician) and Advanced Practice Providers (APPs -  Physician Assistants and Nurse Practitioners) who all work together to provide you with the care you need, when you need it.  We recommend signing up for the patient portal called "MyChart".  Sign up information is provided on this After Visit Summary.  MyChart is used to connect with patients for Virtual Visits (Telemedicine).  Patients are able to view lab/test results, encounter notes, upcoming appointments, etc.  Non-urgent messages can be sent to your provider as well.   To learn more about what you can do with MyChart, go to [email or web address].    Your next appointment:   4 week(s)  The format for your next appointment:   In Person  Provider:   Ambrose Pancoast, NP         Other Instructions   Important Information About Sugar

## 2022-01-17 NOTE — Progress Notes (Unsigned)
Enrolled for [employer] to mail a ZIO XT long term holter monitor to the patients address on file.  ? ?Dr. Pemberton to read. ?

## 2022-01-20 DIAGNOSIS — I480 Paroxysmal atrial fibrillation: Secondary | ICD-10-CM | POA: Diagnosis not present

## 2022-02-02 DIAGNOSIS — I480 Paroxysmal atrial fibrillation: Secondary | ICD-10-CM | POA: Diagnosis not present

## 2022-02-02 MED ORDER — METOPROLOL SUCCINATE ER 25 MG PO TB24: 25.0000 mg | ORAL_TABLET | Freq: Two times a day (BID) | ORAL | 2 refills | Status: DC

## 2022-02-02 NOTE — Telephone Encounter (Signed)
Thanks for the update.  Lets increase her Toprol-XL to 50 mg daily.  She can take 25 mg in the a.m. and 25 mg in the p.m.  Please have her continue to document blood pressures and heart rates.  Please let me know if you have any additional questions.   Ambrose Pancoast, NP

## 2022-02-13 NOTE — Progress Notes (Unsigned)
Office Visit    Patient Name: Tammy [name] Date of Encounter: 02/14/2022  Primary Care Provider:  Shon Baton, MD Primary Cardiologist:  Tammy Bergeron, MD Primary Electrophysiologist: Tammy Epley, MD  Chief Complaint    Tammy [name] is a 81 y.o. female with PMH of syncope, hypothyroidism, HLD, atrial flutter, aortic atherosclerosis who presents today for 1 month follow-up of irregular heartbeat.  Past Medical History    Past Medical History:  Diagnosis Date   Hypothyroidism    Skin lesion of neck    Anterior Neck (Thyroid Tissue)   Thyroid disease    Past Surgical History:  Procedure Laterality Date   ANAL FISSURE REPAIR     broken elbow     pins   ELBOW SURGERY Right    EYE SURGERY     bilateral cataracts    FOOT SURGERY Left    LESION REMOVAL N/A 10/11/2017   Procedure: EXCISION ANTERIOR NECK SKIN LESION ERAS PATHWAY;  Surgeon: Tammy Keens, MD;  Location: [place];  Service: General;  Laterality: N/A;   needle removed from foot     THYROID SURGERY     TONSILLECTOMY     TUBAL LIGATION      Allergies  Allergies  Allergen Reactions   Bee Venom Hives and Other (See Comments)    Carries Epi-pen    History of Present Illness   Tammy [name]  is a 81 year old female with the above mention past medical history who presents today for complaint of abnormal heart rate.  Tammy [name] was initially seen by Dr. Johney [name] in 06/2020 for complaint and admission of syncope.  Event monitor was placed and demonstrated 1 run of SVT lasting 23 seconds with no pauses or ventricular tachycardia..  2D echo was completed showing EF of 60/65%, no RWMA, grade 2 DD, normal RV SF, mild LAE, trivial MR, mild AV sclerosis without stenosis.  Coronary calcium score obtained to rule out ischemic work-up that showed no calcium and aortic atherosclerosis.  She presented to the emergency room 01/27/2021 with palpitations and chest pain.  She was placed on Cardizem drip and  converted to sinus rhythm with rate of 80.  EKG in the ED showed typical atrial flutter.  She was placed on Xarelto 20 mg daily and rate control with Toprol-XL 25 mg.  She was referred to EP for further evaluation.  Antiarrhythmic medications and catheter ablation were discussed and patient elected rhythm control versus  catheter ablation at that time.     She was seen in follow-up on 05/2021 patient had reservations about proceeding with ablation due to possible risk of developing dementia with anesthesia.  She decided to follow-up later in the summer to further discuss.  She was seen 12-03-21 following the death of her husband.  She reported 2-3 episodes of possible atrial fibrillation returning. Try to increase physical activity but feels because of fatigue and was wanting to decrease dosage. Metoprolol XL was decreased to 12.5 mg.  Tammy [name] reached out to [facility] by MyChart message with complaint of increased palpitations. She was advised to increase Toprol-XL 12.5 mg as needed for increased palpitations.  She contacted our office 4 days ago with complaint of increased palpitations that awoke her from sleep.  She presents today for further evaluation of increased palpitations.  She was seen in follow-up on 01/17/2022 with complaint of palpitations that occurred while sleeping.  She described a sensation of flipping in her chest.  7-day ZIO  monitor was obtained that showed predominant rhythm of sinus with no significant pauses or elevated heart rate.  She was given as needed 12.5 mg Toprol-XL for elevated heart rate.  Tammy [name] presents today with her brother-in-law for follow-up.  Since last being seen in the office patient reports she is still experiencing episodes of tachycardia that occur mainly at night.  She reports that her heart rate has elevated to the 120s and is usually relieved with taking 12.5 mg of Toprol-XL.  She is is compliant with her medications denies any other adverse reactions.  During  our visit we discussed the pathophysiology of paroxysmal atrial fibrillation.  Patient had all questions answered to her satisfaction.  With the patient denies chest pain, palpitations, dyspnea, PND, orthopnea, nausea, vomiting, dizziness, syncope, edema, weight gain, or early satiety.   Home Medications    Current Outpatient Medications  Medication Sig Dispense Refill   Cholecalciferol (VITAMIN D3) 2000 units TABS Take 2,000 Units by mouth as needed.     levothyroxine (SYNTHROID, LEVOTHROID) 125 MCG tablet Take 125 mcg by mouth daily before breakfast.     Magnesium 500 MG TABS Take 500 mg by mouth as needed.     metoprolol succinate (TOPROL XL) 25 MG 24 hr tablet Take 1 tablet (25 mg total) by mouth 2 (two) times daily. 180 tablet 2   Multiple Vitamin (MULTIVITAMIN WITH MINERALS) TABS tablet Take 1 tablet by mouth daily.     Multiple Vitamins-Minerals (PRESERVISION AREDS 2 PO) Take by mouth.     rivaroxaban (XARELTO) 20 MG TABS tablet Take 1 tablet (20 mg total) by mouth daily with supper. 30 tablet 11   ascorbic acid (VITAMIN C) 500 MG tablet Take 500 mg by mouth as needed. (Patient not taking: Reported on 02/14/2022)     EPINEPHrine 0.3 mg/0.3 mL IJ SOAJ injection Inject 0.3 mg into the muscle daily as needed (for anaphylatic reactions (BEE STINGS)). For bee stings     No current facility-administered medications for this visit.     Review of Systems  Please see the history of present illness.    (+) Palpitations (+) Fatigue  All other systems reviewed and are otherwise negative except as noted above.  Physical Exam    Wt Readings from Last 3 Encounters:  02/14/22 140 lb (63.5 kg)  01/17/22 138 lb 9.6 oz (62.9 kg)  11/24/21 137 lb 6.4 oz (62.3 kg)   VS: Vitals:   02/14/22 1326  BP: 128/72  Pulse: (!) 49  SpO2: 96%  ,Body mass index is 24.8 kg/m.  Constitutional:      Appearance: Healthy appearance. Not in distress.  Neck:     Vascular: JVD normal.  Pulmonary:      Effort: Pulmonary effort is normal.     Breath sounds: No wheezing. No rales. Diminished in the bases Cardiovascular:     Normal rate. Regular rhythm. Normal S1. Normal S2.      Murmurs: There is no murmur.  Edema:    Peripheral edema absent.  Abdominal:     Palpations: Abdomen is soft non tender. There is no hepatomegaly.  Skin:    General: Skin is warm and dry.  Neurological:     General: No focal deficit present.     Mental Status: Alert and oriented to person, place and time.     Cranial Nerves: Cranial nerves are intact.  EKG/LABS/Other Studies Reviewed    ECG personally reviewed by me today -none completed today  Risk Assessment/Calculations:  CHA2DS2-VASc Score = 3   This indicates a 3.2% annual risk of stroke. The patient's score is based upon: CHF History: 0 HTN History: 0 Diabetes History: 0 Stroke History: 0 Vascular Disease History: 0 Age Score: 2 Gender Score: 1           Lab Results  Component Value Date   WBC 6.8 03/09/2021   HGB 12.9 03/09/2021   HCT 37.0 03/09/2021   MCV 88 03/09/2021   PLT 335 03/09/2021   Lab Results  Component Value Date   CREATININE 0.55 (L) 03/09/2021   BUN 16 03/09/2021   NA 136 03/09/2021   K 4.5 03/09/2021   CL 100 03/09/2021   CO2 22 03/09/2021   Lab Results  Component Value Date   ALT 25 01/19/2021   AST 21 01/19/2021   ALKPHOS 82 01/19/2021   BILITOT 0.4 01/19/2021   No results found for: "CHOL", "HDL", "LDLCALC", "LDLDIRECT", "TRIG", "CHOLHDL"  No results found for: "HGBA1C"  Assessment & Plan    1.  Paroxysmal atrial flutter: -Today patient reports she is still experiencing bouts of tachycardia most often at night while sleeping -Patient encouraged to avoid triggers such as excess caffeine, emotional upset, alcohol, and energy drinks -Creatinine clearance is 64.12  -Current dose of Xarelto 20 mg daily appropriate -Patient has normal electrolytes per recent labs at PCP -CHA2DS2-VASc Score = 3 [CHF  History: 0, HTN History: 0, Diabetes History: 0, Stroke History: 0, Vascular Disease History: 0, Age Score: 2, Gender Score: 1].  Therefore, the patient's annual risk of stroke is 3.2 %.     -We will refer patient to electrophysiology to discuss options for rhythm control or ablation   2.  History of syncope: -Today patient reports dizziness shortness of breath palpitations   3.  Hyperlipidemia: -We will obtain most recent lab work from PCP office regarding cholesterol numbers   4.  Hypothyroidism: -Continue treatment plan per PCP   Disposition: Follow-up with Tammy Bergeron, MD as scheduled    Medication Adjustments/Labs and Tests Ordered: Current medicines are reviewed at length with the patient today.  Concerns regarding medicines are outlined above.   Signed, Mable Fill, Marissa Nestle, NP 02/14/2022, 4:51 PM [facility]  Note:  This document was prepared using Dragon voice recognition software and may include unintentional dictation errors.

## 2022-02-14 ENCOUNTER — Ambulatory Visit: Payer: Medicare HMO | Attending: Nurse Practitioner | Admitting: Nurse Practitioner

## 2022-02-14 ENCOUNTER — Telehealth: Payer: Self-pay | Admitting: Nurse Practitioner

## 2022-02-14 ENCOUNTER — Encounter: Payer: Self-pay | Admitting: Nurse Practitioner

## 2022-02-14 VITALS — BP 128/72 | HR 49 | Ht 63.0 in | Wt 140.0 lb

## 2022-02-14 DIAGNOSIS — R55 Syncope and collapse: Secondary | ICD-10-CM | POA: Diagnosis not present

## 2022-02-14 DIAGNOSIS — E039 Hypothyroidism, unspecified: Secondary | ICD-10-CM

## 2022-02-14 DIAGNOSIS — I480 Paroxysmal atrial fibrillation: Secondary | ICD-10-CM

## 2022-02-14 DIAGNOSIS — E785 Hyperlipidemia, unspecified: Secondary | ICD-10-CM | POA: Diagnosis not present

## 2022-02-14 NOTE — Patient Instructions (Addendum)
Medication Instructions:  Your physician has recommended you make the following change in your medication:   Start taking Toprol XL '25mg'$  daily at bedtime.   Take an extra 1/2 tablet as needed for palpitations.   *If you need a refill on your cardiac medications before your next appointment, please call your pharmacy*   Follow-Up: At [facility], you and your health needs are our priority.  As part of our continuing mission to provide you with exceptional heart care, we have created designated Provider Care Teams.  These Care Teams include your primary Cardiologist (physician) and Advanced Practice Providers (APPs -  Physician Assistants and Nurse Practitioners) who all work together to provide you with the care you need, when you need it.  We recommend signing up for the patient portal called "MyChart".  Sign up information is provided on this After Visit Summary.  MyChart is used to connect with patients for Virtual Visits (Telemedicine).  Patients are able to view lab/test results, encounter notes, upcoming appointments, etc.  Non-urgent messages can be sent to your provider as well.   To learn more about what you can do with MyChart, go to [email or web address].    Your next appointment:   As scheduled   Provider:   Freada Bergeron, MD    Other  You have been referred to electrophysiology

## 2022-02-14 NOTE — Telephone Encounter (Signed)
Pt calling back as requested to inform NP that she is currently taking Metoprolol ER. Pt would also like a callback from nurse. Please advise

## 2022-02-14 NOTE — Telephone Encounter (Signed)
Patient stated she wanted to let Jaquelyn Bitter know she has been taking Metoprolol ER the whole time. She is confused about how she should take as needed doses as well.  Gave the patient recommendations from OV today: Start taking Toprol XL '25mg'$  daily at bedtime.    Take an extra 1/2 tablet as needed for palpitations- daily   The patient verbalized understanding and agreement.

## 2022-03-01 ENCOUNTER — Encounter: Payer: Self-pay | Admitting: Cardiology

## 2022-03-03 ENCOUNTER — Ambulatory Visit: Payer: Medicare HMO | Admitting: Cardiology

## 2022-03-10 ENCOUNTER — Encounter: Payer: Self-pay | Admitting: Cardiology

## 2022-04-27 ENCOUNTER — Ambulatory Visit: Payer: Medicare HMO | Admitting: Cardiology

## 2022-04-27 ENCOUNTER — Ambulatory Visit: Payer: Medicare HMO | Attending: Cardiology | Admitting: Cardiology

## 2022-04-27 ENCOUNTER — Encounter: Payer: Self-pay | Admitting: Cardiology

## 2022-04-27 VITALS — BP 130/74 | HR 84 | Ht 63.0 in | Wt 143.0 lb

## 2022-04-27 DIAGNOSIS — I483 Typical atrial flutter: Secondary | ICD-10-CM | POA: Diagnosis not present

## 2022-04-27 DIAGNOSIS — R55 Syncope and collapse: Secondary | ICD-10-CM

## 2022-04-27 NOTE — Progress Notes (Signed)
Electrophysiology Office Follow up Visit Note:    Date:  04/27/2022   ID:  Tammy [name], DOB 1941-04-03, MRN [id number]  PCP:  Shon Baton, MD  [facility] Cardiologist:  Freada Bergeron, MD  [facility] Electrophysiologist:  Vickie Epley, MD    Interval History:    Tammy [name] is a 81 y.o. female who presents for a follow up visit. They were last seen in clinic 03/09/2021 for AFL. Originally, AFL ablation was scheduled but this was cancelled by the patient.   Saw Jaquelyn Bitter 02/14/2022. September heart monitor reviewed showing salvos of AFL.   Today, she is accompanied by 2 family members. She has been experiencing frequent "spells" of palpitations. These may occur sporadically, but she does notice them at least a couple times per week. The palpitations last for varied durations during day or night. However, they do frequently wake her up at night for as long as 2-3 hours. At those times it feels like her "heart is turning over."  While she was wearing the cardiac monitor she did not feel any palpitations. Typically she does know when they occur. In clinic today she reports feeling an episode of palpitations, confirmed on exam. EKG today shows she is in atrial flutter at 140 bpm.  She endorses compliance with her Xarelto.  She denies any chest pain, shortness of breath, or peripheral edema. No lightheadedness, headaches, syncope, orthopnea, or PND.      Past Medical History:  Diagnosis Date   Hypothyroidism    Skin lesion of neck    Anterior Neck (Thyroid Tissue)   Thyroid disease     Past Surgical History:  Procedure Laterality Date   ANAL FISSURE REPAIR     broken elbow     pins   ELBOW SURGERY Right    EYE SURGERY     bilateral cataracts    FOOT SURGERY Left    LESION REMOVAL N/A 10/11/2017   Procedure: EXCISION ANTERIOR NECK SKIN LESION ERAS PATHWAY;  Surgeon: Coralie Keens, MD;  Location: [place];  Service: General;  Laterality: N/A;   needle  removed from foot     THYROID SURGERY     TONSILLECTOMY     TUBAL LIGATION      Current Medications: Current Meds  Medication Sig   ascorbic acid (VITAMIN C) 500 MG tablet Take 500 mg by mouth as needed.   Cholecalciferol (VITAMIN D3) 2000 units TABS Take 2,000 Units by mouth as needed.   EPINEPHrine 0.3 mg/0.3 mL IJ SOAJ injection Inject 0.3 mg into the muscle daily as needed (for anaphylatic reactions (BEE STINGS)). For bee stings   levothyroxine (SYNTHROID, LEVOTHROID) 125 MCG tablet Take 125 mcg by mouth daily before breakfast.   Magnesium 500 MG TABS Take 500 mg by mouth as needed.   metoprolol succinate (TOPROL XL) 25 MG 24 hr tablet Take 1 tablet (25 mg total) by mouth 2 (two) times daily. (Patient taking differently: Take 25 mg by mouth daily.)   Multiple Vitamin (MULTIVITAMIN WITH MINERALS) TABS tablet Take 1 tablet by mouth daily.   Multiple Vitamins-Minerals (PRESERVISION AREDS 2 PO) Take by mouth.   rivaroxaban (XARELTO) 20 MG TABS tablet Take 1 tablet (20 mg total) by mouth daily with supper.     Allergies:   Bee venom   Social History   Socioeconomic History   Marital status: Married    Spouse name: Not on file   Number of children: Not on file   Years of education:  Not on file   Highest education level: Not on file  Occupational History   Not on file  Tobacco Use   Smoking status: Former   Smokeless tobacco: Never  Vaping Use   Vaping Use: Never used  Substance and Sexual Activity   Alcohol use: Never   Drug use: Never   Sexual activity: Not on file  Other Topics Concern   Not on file  Social History Narrative   ** Merged History Encounter **       Social Determinants of Health   Financial Resource Strain: Not on file  Food Insecurity: Not on file  Transportation Needs: Not on file  Physical Activity: Not on file  Stress: Not on file  Social Connections: Not on file     Family History: The patient's family history includes Heart disease in her  father; Stomach cancer in her paternal grandmother. There is no history of Colon cancer or Pancreatic cancer.  ROS:   Please see the history of present illness.    (+) Palpitations All other systems reviewed and are negative.  EKGs/Labs/Other Studies Reviewed:    The following studies were reviewed today:  02/09/2022 Zio reviewed AFL noted during monitoring period. No AF observed.  EKG:  Personally reviewed. 04/27/2022: Atrial flutter with 2:1 AV conduction.  Ventricular rate 137 bpm.  Recent Labs: No results found for requested labs within last 365 days.   Recent Lipid Panel No results found for: "CHOL", "TRIG", "HDL", "CHOLHDL", "VLDL", "LDLCALC", "LDLDIRECT"  Physical Exam:    VS:  BP 130/74   Pulse 84   Ht '5\' 3"'$  (1.6 m)   Wt 143 lb (64.9 kg)   SpO2 (!) 84%   BMI 25.33 kg/m     Wt Readings from Last 3 Encounters:  04/27/22 143 lb (64.9 kg)  02/14/22 140 lb (63.5 kg)  01/17/22 138 lb 9.6 oz (62.9 kg)     GEN: Well nourished, well developed in no acute distress HEENT: Normal NECK: No JVD; No carotid bruits LYMPHATICS: No lymphadenopathy CARDIAC: RRR, no murmurs, rubs, gallops.  Later in the visit she became tachycardic with a regular rhythm. RESPIRATORY:  Clear to auscultation without rales, wheezing or rhonchi  ABDOMEN: Soft, non-tender, non-distended MUSCULOSKELETAL:  No edema; No deformity  SKIN: Warm and dry NEUROLOGIC:  Alert and oriented x 3 PSYCHIATRIC:  Normal affect        ASSESSMENT:    1. Typical atrial flutter ([place])   2. Syncope and collapse    PLAN:    In order of problems listed above:  #AFL Paroxysmal. Symptomatic.  Episode of typical atrial flutter began during today's appointment actually. On xarelto for stroke ppx. No documented history of AF.   Discussed treatment strategies today including continued conservative management, AAD and catheter ablation.  Discussed treatment options today for their AFL including antiarrhythmic  drug therapy and ablation. Discussed risks, recovery and likelihood of success. Discussed potential need for repeat ablation procedures and antiarrhythmic drugs after an initial ablation.  I also discussed the possibility of developing atrial fibrillation after a successful atrial flutter ablation.  They wish to proceed with scheduling.  Risk, benefits, and alternatives to EP study and radiofrequency ablation for atrial flutter were discussed in detail today. These risks include but are not limited to stroke, bleeding, vascular damage, tamponade, perforation, damage to the esophagus, lungs, and other structures, pulmonary vein stenosis, worsening renal function, and death. The patient understands these risk and wishes to proceed.  We will therefore proceed  with catheter ablation at the next available time.  Carto, ICE, anesthesia are requested for the procedure.    Her family member is having open heart surgery next month.  She is planning to schedule her flutter ablation after he has recovered.  Medication Adjustments/Labs and Tests Ordered: Current medicines are reviewed at length with the patient today.  Concerns regarding medicines are outlined above.   Orders Placed This Encounter  Procedures   Basic metabolic panel   CBC with Differential/Platelet   No orders of the defined types were placed in this encounter.  I,Mathew Stumpf,acting as a Education administrator for Vickie Epley, MD.,have documented all relevant documentation on the behalf of Vickie Epley, MD,as directed by  Vickie Epley, MD while in the presence of Vickie Epley, MD.  I, Vickie Epley, MD, have reviewed all documentation for this visit. The documentation on 04/27/22 for the exam, diagnosis, procedures, and orders are all accurate and complete.   Signed, Lars Mage, MD, [facility], [facility] 04/27/2022 9:13 PM    Electrophysiology [facility]

## 2022-04-27 NOTE — Patient Instructions (Signed)
Medication Instructions:  Your physician recommends that you continue on your current medications as directed. Please refer to the Current Medication list given to you today.  *If you need a refill on your cardiac medications before your next appointment, please call your pharmacy*  Lab Work: BMET and CBC prior to your ablation on February 26th between 8:00am and 4:30pm - you will meet with April at this time to go over your procedure instructions.   If you have labs (blood work) drawn today and your tests are completely normal, you will receive your results only by: [place] (if you have MyChart) OR A paper copy in the mail If you have any lab test that is abnormal or we need to change your treatment, we will call you to review the results.  Testing/Procedures: Your physician has recommended that you have an ablation. Catheter ablation is a medical procedure used to treat some cardiac arrhythmias (irregular heartbeats). During catheter ablation, a long, thin, flexible tube is put into a blood vessel in your groin (upper thigh), or neck. This tube is called an ablation catheter. It is then guided to your heart through the blood vessel. Radio frequency waves destroy small areas of heart tissue where abnormal heartbeats may cause an arrhythmia to start. Please see the instruction sheet given to you today.  Follow-Up: At [facility], you and your health needs are our priority.  As part of our continuing mission to provide you with exceptional heart care, we have created designated Provider Care Teams.  These Care Teams include your primary Cardiologist (physician) and Advanced Practice Providers (APPs -  Physician Assistants and Nurse Practitioners) who all work together to provide you with the care you need, when you need it.   Your next appointment:   We will call you to arrange your follow up appointment.   Important Information About [place]

## 2022-04-27 NOTE — Progress Notes (Deleted)
Electrophysiology Office Follow up Visit Note:    Date:  04/27/2022   ID:  [name], DOB 1941/01/14, MRN [id number]  PCP:  Shon Baton, MD  [facility] Cardiologist:  Freada Bergeron, MD  [facility] Electrophysiologist:  Vickie Epley, MD    Interval History:    Tammy [name] is a 81 y.o. female who presents for a follow up visit. They were last seen in clinic 03/09/2021 for AFL. Originally, AFL ablation was scheduled but this was cancelled by the patient.   Saw Jaquelyn Bitter 02/14/2022. September heart monitor reviewed showing salvos of AFL.       Past Medical History:  Diagnosis Date   Hypothyroidism    Skin lesion of neck    Anterior Neck (Thyroid Tissue)   Thyroid disease     Past Surgical History:  Procedure Laterality Date   ANAL FISSURE REPAIR     broken elbow     pins   ELBOW SURGERY Right    EYE SURGERY     bilateral cataracts    FOOT SURGERY Left    LESION REMOVAL N/A 10/11/2017   Procedure: EXCISION ANTERIOR NECK SKIN LESION ERAS PATHWAY;  Surgeon: Coralie Keens, MD;  Location: [place];  Service: General;  Laterality: N/A;   needle removed from foot     THYROID SURGERY     TONSILLECTOMY     TUBAL LIGATION      Current Medications: No outpatient medications have been marked as taking for the 04/27/22 encounter (Appointment) with Vickie Epley, MD.     Allergies:   Bee venom   Social History   Socioeconomic History   Marital status: Married    Spouse name: Not on file   Number of children: Not on file   Years of education: Not on file   Highest education level: Not on file  Occupational History   Not on file  Tobacco Use   Smoking status: Former   Smokeless tobacco: Never  Vaping Use   Vaping Use: Never used  Substance and Sexual Activity   Alcohol use: Never   Drug use: Never   Sexual activity: Not on file  Other Topics Concern   Not on file  Social History Narrative   ** Merged History Encounter **       Social  Determinants of Health   Financial Resource Strain: Not on file  Food Insecurity: Not on file  Transportation Needs: Not on file  Physical Activity: Not on file  Stress: Not on file  Social Connections: Not on file     Family History: The patient's family history includes Heart disease in her father; Stomach cancer in her paternal grandmother. There is no history of Colon cancer or Pancreatic cancer.  ROS:   Please see the history of present illness.    All other systems reviewed and are negative.  EKGs/Labs/Other Studies Reviewed:    The following studies were reviewed today:  02/09/2022 Zio reviewed AFL noted during monitoring period. No AF observed.    Recent Labs: No results found for requested labs within last 365 days.  Recent Lipid Panel No results found for: "CHOL", "TRIG", "HDL", "CHOLHDL", "VLDL", "LDLCALC", "LDLDIRECT"  Physical Exam:    VS:  There were no vitals taken for this visit.    Wt Readings from Last 3 Encounters:  02/14/22 140 lb (63.5 kg)  01/17/22 138 lb 9.6 oz (62.9 kg)  11/24/21 137 lb 6.4 oz (62.3 kg)     GEN: ***  Well nourished, well developed in no acute distress HEENT: Normal NECK: No JVD; No carotid bruits LYMPHATICS: No lymphadenopathy CARDIAC: ***RRR, no murmurs, rubs, gallops RESPIRATORY:  Clear to auscultation without rales, wheezing or rhonchi  ABDOMEN: Soft, non-tender, non-distended MUSCULOSKELETAL:  No edema; No deformity  SKIN: Warm and dry NEUROLOGIC:  Alert and oriented x 3 PSYCHIATRIC:  Normal affect        ASSESSMENT:    1. Typical atrial flutter ([place])   2. Syncope and collapse    PLAN:    In order of problems listed above:  #AFL Paroxysmal. Symptomatic. On xarelto for stroke ppx. No documented history of AF.   Discussed treatment strategies today including continued conservative management, AAD and catheter ablation.            Total time spent with patient today *** minutes. This includes  reviewing records, evaluating the patient and coordinating care.   Medication Adjustments/Labs and Tests Ordered: Current medicines are reviewed at length with the patient today.  Concerns regarding medicines are outlined above.  No orders of the defined types were placed in this encounter.  No orders of the defined types were placed in this encounter.    Signed, Lars Mage, MD, [facility], [facility] 04/27/2022 5:50 AM    Electrophysiology [facility]

## 2022-05-04 DIAGNOSIS — H5203 Hypermetropia, bilateral: Secondary | ICD-10-CM | POA: Diagnosis not present

## 2022-05-10 ENCOUNTER — Ambulatory Visit: Payer: Medicare HMO | Admitting: Cardiology

## 2022-05-16 NOTE — Addendum Note (Signed)
Addended by: James Ivanoff D on: 05/16/2022 11:49 AM   Modules accepted: Orders

## 2022-06-23 DIAGNOSIS — R7989 Other specified abnormal findings of blood chemistry: Secondary | ICD-10-CM | POA: Diagnosis not present

## 2022-06-23 DIAGNOSIS — E559 Vitamin D deficiency, unspecified: Secondary | ICD-10-CM | POA: Diagnosis not present

## 2022-06-23 DIAGNOSIS — E039 Hypothyroidism, unspecified: Secondary | ICD-10-CM | POA: Diagnosis not present

## 2022-06-23 DIAGNOSIS — R739 Hyperglycemia, unspecified: Secondary | ICD-10-CM | POA: Diagnosis not present

## 2022-06-23 DIAGNOSIS — E785 Hyperlipidemia, unspecified: Secondary | ICD-10-CM | POA: Diagnosis not present

## 2022-06-27 ENCOUNTER — Ambulatory Visit: Payer: Medicare HMO | Attending: Cardiology

## 2022-06-27 ENCOUNTER — Encounter: Payer: Self-pay | Admitting: Cardiology

## 2022-06-27 DIAGNOSIS — I483 Typical atrial flutter: Secondary | ICD-10-CM

## 2022-06-27 LAB — CBC WITH DIFFERENTIAL/PLATELET
Basophils Absolute: 0 10*3/uL (ref 0.0–0.2)
Basos: 1 %
EOS (ABSOLUTE): 0.1 10*3/uL (ref 0.0–0.4)
Eos: 2 %
Hematocrit: 37.3 % (ref 34.0–46.6)
Hemoglobin: 12.8 g/dL (ref 11.1–15.9)
Lymphocytes Absolute: 2.2 10*3/uL (ref 0.7–3.1)
Lymphs: 36 %
MCH: 30.2 pg (ref 26.6–33.0)
MCHC: 34.3 g/dL (ref 31.5–35.7)
MCV: 88 fL (ref 79–97)
Monocytes Absolute: 0.5 10*3/uL (ref 0.1–0.9)
Monocytes: 8 %
Neutrophils Absolute: 3.2 10*3/uL (ref 1.4–7.0)
Neutrophils: 53 %
Platelets: 322 10*3/uL (ref 150–450)
RBC: 4.24 x10E6/uL (ref 3.77–5.28)
RDW: 13.9 % (ref 11.7–15.4)
WBC: 6.1 10*3/uL (ref 3.4–10.8)

## 2022-06-27 LAB — BASIC METABOLIC PANEL
BUN/Creatinine Ratio: 27 (ref 12–28)
BUN: 17 mg/dL (ref 8–27)
CO2: 25 mmol/L (ref 20–29)
Calcium: 9.7 mg/dL (ref 8.7–10.3)
Chloride: 99 mmol/L (ref 96–106)
Creatinine, Ser: 0.63 mg/dL (ref 0.57–1.00)
Glucose: 97 mg/dL (ref 70–99)
Potassium: 5.1 mmol/L (ref 3.5–5.2)
Sodium: 132 mmol/L — ABNORMAL LOW (ref 134–144)
eGFR: 89 mL/min/{1.73_m2} (ref >59)

## 2022-06-29 DIAGNOSIS — Z85828 Personal history of other malignant neoplasm of skin: Secondary | ICD-10-CM | POA: Diagnosis not present

## 2022-06-29 DIAGNOSIS — R208 Other disturbances of skin sensation: Secondary | ICD-10-CM | POA: Diagnosis not present

## 2022-06-29 DIAGNOSIS — L821 Other seborrheic keratosis: Secondary | ICD-10-CM | POA: Diagnosis not present

## 2022-06-30 DIAGNOSIS — E785 Hyperlipidemia, unspecified: Secondary | ICD-10-CM | POA: Diagnosis not present

## 2022-06-30 DIAGNOSIS — M810 Age-related osteoporosis without current pathological fracture: Secondary | ICD-10-CM | POA: Diagnosis not present

## 2022-06-30 DIAGNOSIS — E559 Vitamin D deficiency, unspecified: Secondary | ICD-10-CM | POA: Diagnosis not present

## 2022-06-30 DIAGNOSIS — I10 Essential (primary) hypertension: Secondary | ICD-10-CM | POA: Diagnosis not present

## 2022-06-30 DIAGNOSIS — Z0000 Encounter for general adult medical examination without abnormal findings: Secondary | ICD-10-CM | POA: Diagnosis not present

## 2022-06-30 DIAGNOSIS — I483 Typical atrial flutter: Secondary | ICD-10-CM | POA: Diagnosis not present

## 2022-06-30 DIAGNOSIS — R739 Hyperglycemia, unspecified: Secondary | ICD-10-CM | POA: Diagnosis not present

## 2022-06-30 DIAGNOSIS — I700 Atherosclerosis of aorta: Secondary | ICD-10-CM | POA: Diagnosis not present

## 2022-06-30 DIAGNOSIS — R82998 Other abnormal findings in urine: Secondary | ICD-10-CM | POA: Diagnosis not present

## 2022-06-30 DIAGNOSIS — E039 Hypothyroidism, unspecified: Secondary | ICD-10-CM | POA: Diagnosis not present

## 2022-07-08 NOTE — Pre-Procedure Instructions (Signed)
Spoke with patient regarding time change.  Instructed patient on the following items: Arrival time 0515 Nothing to eat or drink after midnight No meds AM of procedure Responsible person to drive you home and stay with you for 24 hrs  Have you missed any doses of anti-coagulant Xarelto- hasn't missed any doses

## 2022-07-10 NOTE — Anesthesia Preprocedure Evaluation (Signed)
Anesthesia Evaluation  Patient identified by MRN, date of birth, ID band Patient awake    Reviewed: Allergy & Precautions, NPO status , Patient's Chart, lab work & pertinent test results  History of Anesthesia Complications Negative for: history of anesthetic complications  Airway Mallampati: II  TM Distance: >3 FB Neck ROM: Full    Dental  (+) Dental Advisory Given, Chipped,    Pulmonary former smoker   Pulmonary exam normal        Cardiovascular Normal cardiovascular exam+ dysrhythmias Atrial Fibrillation      Neuro/Psych negative neurological ROS  negative psych ROS   GI/Hepatic negative GI ROS, Neg liver ROS,,,  Endo/Other  Hypothyroidism    Renal/GU negative Renal ROS     Musculoskeletal negative musculoskeletal ROS (+)    Abdominal   Peds  Hematology negative hematology ROS (+)   Anesthesia Other Findings   Reproductive/Obstetrics                              Anesthesia Physical Anesthesia Plan  ASA: 3  Anesthesia Plan: General   Post-op Pain Management: Tylenol PO (pre-op)*   Induction: Intravenous  PONV Risk Score and Plan: 3 and Treatment may vary due to age or medical condition, Ondansetron and Propofol infusion  Airway Management Planned: Oral ETT  Additional Equipment: None  Intra-op Plan:   Post-operative Plan: Extubation in OR  Informed Consent: I have reviewed the patients History and Physical, chart, labs and discussed the procedure including the risks, benefits and alternatives for the proposed anesthesia with the patient or authorized representative who has indicated his/her understanding and acceptance.     Dental advisory given  Plan Discussed with: CRNA and Anesthesiologist  Anesthesia Plan Comments:         Anesthesia Quick Evaluation

## 2022-07-11 ENCOUNTER — Ambulatory Visit (HOSPITAL_BASED_OUTPATIENT_CLINIC_OR_DEPARTMENT_OTHER): Payer: Medicare HMO | Admitting: Anesthesiology

## 2022-07-11 ENCOUNTER — Ambulatory Visit (HOSPITAL_COMMUNITY)
Admission: RE | Admit: 2022-07-11 | Discharge: 2022-07-11 | Disposition: A | Discharge status: Home / Self Care | Payer: Medicare HMO | Source: Ambulatory Visit | Attending: Cardiology | Admitting: Cardiology

## 2022-07-11 ENCOUNTER — Encounter (HOSPITAL_COMMUNITY)
Admission: RE | Disposition: A | Discharge status: Home / Self Care | Payer: Self-pay | Source: Ambulatory Visit | Attending: Cardiology

## 2022-07-11 ENCOUNTER — Ambulatory Visit (HOSPITAL_COMMUNITY): Payer: Medicare HMO | Admitting: Anesthesiology

## 2022-07-11 ENCOUNTER — Encounter (HOSPITAL_COMMUNITY): Payer: Self-pay | Admitting: Cardiology

## 2022-07-11 DIAGNOSIS — Z8249 Family history of ischemic heart disease and other diseases of the circulatory system: Secondary | ICD-10-CM | POA: Insufficient documentation

## 2022-07-11 DIAGNOSIS — I4891 Unspecified atrial fibrillation: Secondary | ICD-10-CM

## 2022-07-11 DIAGNOSIS — E039 Hypothyroidism, unspecified: Secondary | ICD-10-CM | POA: Insufficient documentation

## 2022-07-11 DIAGNOSIS — R55 Syncope and collapse: Secondary | ICD-10-CM | POA: Insufficient documentation

## 2022-07-11 DIAGNOSIS — Z87891 Personal history of nicotine dependence: Secondary | ICD-10-CM | POA: Diagnosis not present

## 2022-07-11 DIAGNOSIS — I4892 Unspecified atrial flutter: Secondary | ICD-10-CM

## 2022-07-11 DIAGNOSIS — Z7901 Long term (current) use of anticoagulants: Secondary | ICD-10-CM | POA: Insufficient documentation

## 2022-07-11 DIAGNOSIS — I483 Typical atrial flutter: Secondary | ICD-10-CM | POA: Diagnosis not present

## 2022-07-11 HISTORY — PX: A-FLUTTER ABLATION: EP1230

## 2022-07-11 SURGERY — A-FLUTTER ABLATION
Anesthesia: General

## 2022-07-11 MED ORDER — FENTANYL CITRATE (PF) 250 MCG/5ML IJ SOLN
INTRAMUSCULAR | Status: DC | PRN
  Administered 2022-07-11: 50 ug via INTRAVENOUS

## 2022-07-11 MED ORDER — EPHEDRINE SULFATE-NACL 50-0.9 MG/10ML-% IV SOSY
PREFILLED_SYRINGE | INTRAVENOUS | Status: DC | PRN
  Administered 2022-07-11 (×2): 5 mg via INTRAVENOUS

## 2022-07-11 MED ORDER — ONDANSETRON HCL 4 MG/2ML IJ SOLN: 4.0000 mg | Freq: Four times a day (QID) | INTRAMUSCULAR | Status: DC | PRN

## 2022-07-11 MED ORDER — LIDOCAINE 2% (20 MG/ML) 5 ML SYRINGE
INTRAMUSCULAR | Status: DC | PRN
  Administered 2022-07-11: 60 mg via INTRAVENOUS

## 2022-07-11 MED ORDER — FENTANYL CITRATE (PF) 100 MCG/2ML IJ SOLN
INTRAMUSCULAR | Status: AC
  Filled 2022-07-11: qty 2

## 2022-07-11 MED ORDER — SODIUM CHLORIDE 0.9% FLUSH: 3.0000 mL | Freq: Two times a day (BID) | INTRAVENOUS | Status: DC

## 2022-07-11 MED ORDER — SODIUM CHLORIDE 0.9% FLUSH: 3.0000 mL | INTRAVENOUS | Status: DC | PRN

## 2022-07-11 MED ORDER — HEPARIN SODIUM (PORCINE) 1000 UNIT/ML IJ SOLN
INTRAMUSCULAR | Status: DC | PRN
  Administered 2022-07-11: 1000 [IU] via INTRAVENOUS

## 2022-07-11 MED ORDER — SODIUM CHLORIDE 0.9 % IV SOLN: INTRAVENOUS | Status: DC

## 2022-07-11 MED ORDER — ACETAMINOPHEN 325 MG PO TABS
650.0000 mg | ORAL_TABLET | ORAL | Status: DC | PRN
  Administered 2022-07-11: 650 mg via ORAL
  Filled 2022-07-11: qty 2

## 2022-07-11 MED ORDER — SUGAMMADEX SODIUM 200 MG/2ML IV SOLN
INTRAVENOUS | Status: DC | PRN
  Administered 2022-07-11: 200 mg via INTRAVENOUS

## 2022-07-11 MED ORDER — HEPARIN (PORCINE) IN NACL 1000-0.9 UT/500ML-% IV SOLN
INTRAVENOUS | Status: DC | PRN
  Administered 2022-07-11 (×2): 500 mL

## 2022-07-11 MED ORDER — PHENYLEPHRINE 80 MCG/ML (10ML) SYRINGE FOR IV PUSH (FOR BLOOD PRESSURE SUPPORT)
PREFILLED_SYRINGE | INTRAVENOUS | Status: DC | PRN
  Administered 2022-07-11: 80 ug via INTRAVENOUS

## 2022-07-11 MED ORDER — SODIUM CHLORIDE 0.9 % IV SOLN: 250.0000 mL | INTRAVENOUS | Status: DC | PRN

## 2022-07-11 MED ORDER — PROPOFOL 10 MG/ML IV BOLUS
INTRAVENOUS | Status: DC | PRN
  Administered 2022-07-11: 25 ug/kg/min via INTRAVENOUS
  Administered 2022-07-11: 110 mg via INTRAVENOUS

## 2022-07-11 MED ORDER — ONDANSETRON HCL 4 MG/2ML IJ SOLN
INTRAMUSCULAR | Status: DC | PRN
  Administered 2022-07-11: 4 mg via INTRAVENOUS

## 2022-07-11 MED ORDER — PHENYLEPHRINE HCL-NACL 20-0.9 MG/250ML-% IV SOLN
INTRAVENOUS | Status: DC | PRN
  Administered 2022-07-11: 25 ug/min via INTRAVENOUS

## 2022-07-11 MED ORDER — HEPARIN SODIUM (PORCINE) 1000 UNIT/ML IJ SOLN
INTRAMUSCULAR | Status: AC
  Filled 2022-07-11: qty 10

## 2022-07-11 MED ORDER — ACETAMINOPHEN 500 MG PO TABS
1000.0000 mg | ORAL_TABLET | Freq: Once | ORAL | Status: AC
  Administered 2022-07-11: 1000 mg via ORAL
  Filled 2022-07-11: qty 2

## 2022-07-11 MED ORDER — ROCURONIUM BROMIDE 10 MG/ML (PF) SYRINGE
PREFILLED_SYRINGE | INTRAVENOUS | Status: DC | PRN
  Administered 2022-07-11: 50 mg via INTRAVENOUS
  Administered 2022-07-11: 10 mg via INTRAVENOUS

## 2022-07-11 SURGICAL SUPPLY — 14 items
BAG SNAP BAND KOVER 36X36 (MISCELLANEOUS) IMPLANT
CATH 8FR REPROCESSED SOUNDSTAR (CATHETERS) ×1 IMPLANT
CATH 8FR SOUNDSTAR REPROCESSED (CATHETERS) IMPLANT
CATH ABLAT QDOT MICRO BI TC FJ (CATHETERS) IMPLANT
CATH WEBSTER BI DIR CS D-F CRV (CATHETERS) IMPLANT
CLOSURE PERCLOSE PROSTYLE (VASCULAR PRODUCTS) IMPLANT
PACK EP LATEX FREE (CUSTOM PROCEDURE TRAY) ×1
PACK EP LF (CUSTOM PROCEDURE TRAY) ×2 IMPLANT
PAD DEFIB RADIO PHYSIO CONN (PAD) ×2 IMPLANT
PATCH CARTO3 (PAD) IMPLANT
SHEATH PINNACLE 8F 10CM (SHEATH) IMPLANT
SHEATH PINNACLE 9F 10CM (SHEATH) IMPLANT
SHEATH PROBE COVER 6X72 (BAG) IMPLANT
TUBING SMART ABLATE COOLFLOW (TUBING) IMPLANT

## 2022-07-11 NOTE — Anesthesia Procedure Notes (Signed)
Procedure Name: Intubation Date/Time: 07/11/2022 7:46 AM  Performed by: Ester Rink, CRNAPre-anesthesia Checklist: Patient identified, Emergency Drugs available, Suction available and Patient being monitored Patient Re-evaluated:Patient Re-evaluated prior to induction Oxygen Delivery Method: Circle system utilized Preoxygenation: Pre-oxygenation with 100% oxygen Induction Type: IV induction Ventilation: Mask ventilation without difficulty Laryngoscope Size: Mac and 4 Grade View: Grade I Tube type: Oral Tube size: 7.0 mm Number of attempts: 1 Airway Equipment and Method: Stylet and Oral airway Placement Confirmation: ETT inserted through vocal cords under direct vision, positive ETCO2 and breath sounds checked- equal and bilateral Secured at: 22 cm Tube secured with: Tape Dental Injury: Teeth and Oropharynx as per pre-operative assessment

## 2022-07-11 NOTE — Discharge Instructions (Signed)

## 2022-07-11 NOTE — Progress Notes (Signed)
Pt ambulated to and from bathroom to void with no oozing from bilateral sites

## 2022-07-11 NOTE — H&P (Signed)
Electrophysiology Office Follow up Visit Note:     Date:  07/11/2022    ID:  MEEKAH FURLOUGH, DOB 1941-02-11, MRN [id number]   PCP:  Shon Baton, MD          [facility] Cardiologist:  Freada Bergeron, MD  [facility] Electrophysiologist:  Vickie Epley, MD      Interval History:     Tammy [name] is a 82 y.o. female who presents for a follow up visit. They were last seen in clinic 03/09/2021 for AFL. Originally, AFL ablation was scheduled but this was cancelled by the patient.    Saw Jaquelyn Bitter 02/14/2022. September heart monitor reviewed showing salvos of AFL.    Today, she is accompanied by 2 family members. She has been experiencing frequent "spells" of palpitations. These may occur sporadically, but she does notice them at least a couple times per week. The palpitations last for varied durations during day or night. However, they do frequently wake her up at night for as long as 2-3 hours. At those times it feels like her "heart is turning over."   While she was wearing the cardiac monitor she did not feel any palpitations. Typically she does know when they occur. In clinic today she reports feeling an episode of palpitations, confirmed on exam. EKG today shows she is in atrial flutter at 140 bpm.   She endorses compliance with her Xarelto.   She denies any chest pain, shortness of breath, or peripheral edema. No lightheadedness, headaches, syncope, orthopnea, or PND.   Presents for AFL ablation today. No problems with [place].      Objective      Past Medical History:  Diagnosis Date   Hypothyroidism     Skin lesion of neck      Anterior Neck (Thyroid Tissue)   Thyroid disease             Past Surgical History:  Procedure Laterality Date   ANAL FISSURE REPAIR       broken elbow        pins   ELBOW SURGERY Right     EYE SURGERY        bilateral cataracts    FOOT SURGERY Left     LESION REMOVAL N/A 10/11/2017    Procedure: EXCISION ANTERIOR NECK SKIN LESION  ERAS PATHWAY;  Surgeon: Coralie Keens, MD;  Location: [facility];  Service: General;  Laterality: N/A;   needle removed from foot       THYROID SURGERY       TONSILLECTOMY       TUBAL LIGATION          Current Medications: Active Medications      Current Meds  Medication Sig   ascorbic acid (VITAMIN C) 500 MG tablet Take 500 mg by mouth as needed.   Cholecalciferol (VITAMIN D3) 2000 units TABS Take 2,000 Units by mouth as needed.   EPINEPHrine 0.3 mg/0.3 mL IJ SOAJ injection Inject 0.3 mg into the muscle daily as needed (for anaphylatic reactions (BEE STINGS)). For bee stings   levothyroxine (SYNTHROID, LEVOTHROID) 125 MCG tablet Take 125 mcg by mouth daily before breakfast.   Magnesium 500 MG TABS Take 500 mg by mouth as needed.   metoprolol succinate (TOPROL XL) 25 MG 24 hr tablet Take 1 tablet (25 mg total) by mouth 2 (two) times daily. (Patient taking differently: Take 25 mg by mouth daily.)   Multiple Vitamin (MULTIVITAMIN WITH MINERALS) TABS tablet Take 1 tablet by  mouth daily.   Multiple Vitamins-Minerals (PRESERVISION AREDS 2 PO) Take by mouth.   rivaroxaban (XARELTO) 20 MG TABS tablet Take 1 tablet (20 mg total) by mouth daily with supper.        Allergies:   Bee venom    Social History         Socioeconomic History   Marital status: Married      Spouse name: Not on file   Number of children: Not on file   Years of education: Not on file   Highest education level: Not on file  Occupational History   Not on file  Tobacco Use   Smoking status: Former   Smokeless tobacco: Never  Vaping Use   Vaping Use: Never used  Substance and Sexual Activity   Alcohol use: Never   Drug use: Never   Sexual activity: Not on file  Other Topics Concern   Not on file  Social History Narrative    ** Merged History Encounter **         Social Determinants of Health    Financial Resource Strain: Not on file  Food Insecurity: Not on file  Transportation Needs: Not on file   Physical Activity: Not on file  Stress: Not on file  Social Connections: Not on file      Family History: The patient's family history includes Heart disease in her father; Stomach cancer in her paternal grandmother. There is no history of Colon cancer or Pancreatic cancer.   ROS:   Please see the history of present illness.    (+) Palpitations All other systems reviewed and are negative.   EKGs/Labs/Other Studies Reviewed:     The following studies were reviewed today:   02/09/2022 [facility] reviewed AFL noted during monitoring period. No AF observed.   EKG:  Personally reviewed. 04/27/2022: Atrial flutter with 2:1 AV conduction.  Ventricular rate 137 bpm.   Recent Labs: No results found for requested labs within last 365 days.    Recent Lipid Panel Labs (Brief)  No results found for: "CHOL", "TRIG", "HDL", "CHOLHDL", "VLDL", "LDLCALC", "LDLDIRECT"     Physical Exam:     VS:  BP 150/81   Pulse 51   Ht '5\' 3"'$  (1.6 m)   Wt 143 lb (64.9 kg)   SpO2 (!) 84%   BMI 25.33 kg/m         Wt Readings from Last 3 Encounters:  04/27/22 143 lb (64.9 kg)  02/14/22 140 lb (63.5 kg)  01/17/22 138 lb 9.6 oz (62.9 kg)      GEN: Well nourished, well developed in no acute distress CARDIAC: RRR, no murmurs, rubs, gallops.   RESPIRATORY:  Clear to auscultation without rales, wheezing or rhonchi            Assessment ASSESSMENT:     1. Typical atrial flutter ([place])   2. Syncope and collapse     PLAN:     In order of problems listed above:   #AFL Paroxysmal. Symptomatic.  Episode of typical atrial flutter began during today's appointment actually. On xarelto for stroke ppx. No documented history of AF.    Discussed treatment strategies today including continued conservative management, AAD and catheter ablation.   Discussed treatment options today for their AFL including antiarrhythmic drug therapy and ablation. Discussed risks, recovery and likelihood of success. Discussed  potential need for repeat ablation procedures and antiarrhythmic drugs after an initial ablation.  I also discussed the possibility of developing atrial fibrillation after a  successful atrial flutter ablation.  They wish to proceed with scheduling.   Risk, benefits, and alternatives to EP study and radiofrequency ablation for atrial flutter were discussed in detail today. These risks include but are not limited to stroke, bleeding, vascular damage, tamponade, perforation, damage to the esophagus, lungs, and other structures, pulmonary vein stenosis, worsening renal function, and death. The patient understands these risk and wishes to proceed.  We will therefore proceed with catheter ablation at the next available time.  Carto, ICE, anesthesia are requested for the procedure.     Her family member is having open heart surgery next month.  She is planning to schedule her flutter ablation after he has recovered.    Presents for AFL ablation. Procedure reviewed.    Signed, Lars Mage, MD, [facility], [facility] 07/11/2022 Electrophysiology [facility]

## 2022-07-11 NOTE — Anesthesia Postprocedure Evaluation (Signed)
Anesthesia Post Note  Patient: Tammy [name]  Procedure(s) Performed: A-FLUTTER ABLATION     Patient location during evaluation: PACU Anesthesia Type: General Level of consciousness: awake and alert Pain management: pain level controlled Vital Signs Assessment: post-procedure vital signs reviewed and stable Respiratory status: spontaneous breathing, nonlabored ventilation and respiratory function stable Cardiovascular status: stable and blood pressure returned to baseline Anesthetic complications: no   There were no known notable events for this encounter.  Last Vitals:  Vitals:   07/11/22 1200 07/11/22 1300  BP: 136/69 (!) 104/56  Pulse: (!) 53 (!) 54  Resp: 12 17  Temp:    SpO2: 97% 98%    Last Pain:  Vitals:   07/11/22 1020  TempSrc:   PainSc: 0-No pain                 Audry Pili

## 2022-07-11 NOTE — Transfer of Care (Signed)
Immediate Anesthesia Transfer of Care Note  Patient: Tammy [name]  Procedure(s) Performed: A-FLUTTER ABLATION  Patient Location: PACU  Anesthesia Type:General  Level of Consciousness: awake, alert , and oriented  Airway & Oxygen Therapy: Patient connected to nasal cannula oxygen  Post-op Assessment: Report given to RN and Post -op Vital signs reviewed and stable  Post vital signs: Reviewed and stable  Last Vitals:  Vitals Value Taken Time  BP 136/47 07/11/22 0941  Temp    Pulse 63 07/11/22 0941  Resp 15 07/11/22 0941  SpO2 100 % 07/11/22 0941  Vitals shown include unvalidated device data.  Last Pain:  Vitals:   07/11/22 0554  TempSrc: Temporal  PainSc:          Complications: There were no known notable events for this encounter.

## 2022-07-18 ENCOUNTER — Encounter: Payer: Self-pay | Admitting: Cardiology

## 2022-08-01 ENCOUNTER — Encounter: Payer: Self-pay | Admitting: Cardiology

## 2022-08-08 NOTE — Progress Notes (Unsigned)
  Electrophysiology Office Follow up Visit Note:    Date:  08/09/2022   ID:  Tammy [name], DOB 04-May-1940, MRN [id number]  PCP:  Creola Corn, MD  [facility] Cardiologist:  Meriam Sprague, MD  [facility] Electrophysiologist:  Lanier Prude, MD    Interval History:    Tammy [name] is a 82 y.o. female who presents for a follow up visit.   She had an atrial flutter ablation July 11, 2022. She sent a message August 01, 2022 reporting intermittent palpitations.  The palpitations were different than her prior episodes of atrial flutter given they were not "full-blown".  She tells me that she has been doing well.  She had a small knot on her groin access sites that seems to be improving.  No drainage from the puncture sites.     Past medical, surgical, social and family history were reviewed.  ROS:   Please see the history of present illness.    All other systems reviewed and are negative.  EKGs/Labs/Other Studies Reviewed:    The following studies were reviewed today:   EKG:  The ekg ordered today demonstrates sinus rhythm.   Physical Exam:    VS:  BP (!) 148/72   Pulse (!) 57   Ht 5' (1.524 m)   Wt 148 lb 9.6 oz (67.4 kg)   HC 3" (7.6 cm)   SpO2 99%   BMI 29.02 kg/m     Wt Readings from Last 3 Encounters:  08/09/22 148 lb 9.6 oz (67.4 kg)  07/11/22 144 lb (65.3 kg)  04/27/22 143 lb (64.9 kg)     GEN:  Well nourished, well developed in no acute distress CARDIAC: RRR, no murmurs, rubs, gallops.  Bilateral femoral venous sites examined with a female medical assistant in the room show the areas to be well-healed.  No hematoma or significant pain. RESPIRATORY:  Clear to auscultation without rales, wheezing or rhonchi       ASSESSMENT:    1. Typical atrial flutter    PLAN:    In order of problems listed above:  #Atrial flutter Doing well after her July 11, 2018 for atrial flutter ablation. She is currently on Xarelto for stroke prophylaxis.   She takes metoprolol succinate 25 mg by mouth once daily.  She will cut her dose in half for the next 3 to 4 days and then stop altogether.  She will continue her Xarelto daily until the 35-month mark after the ablation.  No evidence of atrial fibrillation.  We again discussed the possibility of her developing atrial fibrillation after a successful atrial flutter ablation.  We discussed the need for anticoagulation early after an atrial flutter ablation and if she were to develop atrial fibrillation.  She would like to proceed with a loop recorder implant at the 76-month mark after her ablation.  We will get this arranged for her.  [employer] device.         Signed, Steffanie Dunn, MD, [facility], [facility] 08/09/2022 9:55 AM    Electrophysiology [facility]

## 2022-08-09 ENCOUNTER — Encounter: Payer: Self-pay | Admitting: Cardiology

## 2022-08-09 ENCOUNTER — Ambulatory Visit: Payer: Medicare HMO | Attending: Cardiology | Admitting: Cardiology

## 2022-08-09 VITALS — BP 148/72 | HR 57 | Ht 60.0 in | Wt 148.6 lb

## 2022-08-09 DIAGNOSIS — I483 Typical atrial flutter: Secondary | ICD-10-CM

## 2022-08-09 NOTE — Patient Instructions (Signed)
Medication Instructions:  Your physician has recommended you make the following change in your medication:  1) DECREASE metoprolol succinate (Toprol XL) to 1/2 tablet (12.5 mg) for 3-4 days, then STOP taking *If you need a refill on your cardiac medications before your next appointment, please call your pharmacy*  Follow-Up: At [facility], you and your health needs are our priority.  As part of our continuing mission to provide you with exceptional heart care, we have created designated Provider Care Teams.  These Care Teams include your primary Cardiologist (physician) and Advanced Practice Providers (APPs -  Physician Assistants and Nurse Practitioners) who all work together to provide you with the care you need, when you need it.  Your next appointment:   3 month(s)  Provider:   Steffanie Dunn, MD    You will have a loop recorder implanted at your next appointment. There are no restrictions or special instructions prior to this appointment.

## 2022-08-15 ENCOUNTER — Encounter: Payer: Self-pay | Admitting: Cardiology

## 2022-08-15 NOTE — Telephone Encounter (Signed)
Pt reports that she did not take any Metoprolol last night for the first time. This morning she had "a spell where my heart was racing, it got to 133". The worried her and she took a Metoprolol. She was wanting to know what to do going forward. Pt advised that if occurs again, as long as she is not symptomatic, then wait at least 30 min to see if "episode" lasts before taking a Metoprolol. She will try and keep a log going forward, if needed. Advised that if she begins to experience frequent heart racing to let us know.  Aware may have to do a monitor if continues. Patient verbalized understanding and agreeable to plan.

## 2022-09-19 ENCOUNTER — Encounter: Payer: Self-pay | Admitting: Cardiology

## 2022-10-06 ENCOUNTER — Encounter: Payer: Self-pay | Admitting: Cardiology

## 2022-10-07 ENCOUNTER — Encounter: Payer: Self-pay | Admitting: Cardiology

## 2022-10-07 NOTE — Progress Notes (Unsigned)
    Tammy [name] D.[facility] [address] [place] [address] Phone: [phone]   Assessment and Plan:     There are no diagnoses linked to this encounter.  ***   Pertinent previous records reviewed include ***   Follow Up: ***     Subjective:   I, Tammy [name], am serving as a Neurosurgeon for Doctor Richardean Sale  Chief Complaint: left leg pain   HPI:   10/10/2022 Patient is a 82 year old female complaining left leg pain. Patient states  Relevant Historical Information: ***  Additional pertinent review of systems negative.   Current Outpatient Medications:    ascorbic acid (VITAMIN C) 500 MG tablet, Take 500 mg by mouth as needed (feel like getting a cold)., Disp: , Rfl:    Cholecalciferol (VITAMIN D3) 2000 units TABS, Take 2,000 Units by mouth daily., Disp: , Rfl:    EPINEPHrine 0.3 mg/0.3 mL IJ SOAJ injection, Inject 0.3 mg into the muscle daily as needed (for anaphylatic reactions (BEE STINGS)). For bee stings, Disp: , Rfl:    levothyroxine (SYNTHROID, LEVOTHROID) 125 MCG tablet, Take 125 mcg by mouth daily before breakfast., Disp: , Rfl:    Magnesium Citrate POWD, Take 1 Scoop by mouth at bedtime. 325 mg, Disp: , Rfl:    Multiple Vitamin (MULTIVITAMIN WITH MINERALS) TABS tablet, Take 1 tablet by mouth daily., Disp: , Rfl:    Multiple Vitamins-Minerals (PRESERVISION AREDS 2 PO), Take 1 tablet by mouth daily., Disp: , Rfl:    olopatadine (PATADAY) 0.1 % ophthalmic solution, Place 1 drop into the left eye daily as needed for allergies., Disp: , Rfl:    rivaroxaban (XARELTO) 20 MG TABS tablet, Take 1 tablet (20 mg total) by mouth daily with supper., Disp: 30 tablet, Rfl: 11   Objective:     There were no vitals filed for this visit.    There is no height or weight on file to calculate BMI.    Physical Exam:    ***   Electronically signed by:  Tammy [name] D.[facility] 12:56 PM 10/07/22

## 2022-10-10 ENCOUNTER — Ambulatory Visit: Payer: Medicare HMO | Admitting: Sports Medicine

## 2022-10-10 ENCOUNTER — Ambulatory Visit (INDEPENDENT_AMBULATORY_CARE_PROVIDER_SITE_OTHER): Payer: Medicare HMO

## 2022-10-10 VITALS — BP 120/80 | HR 62 | Ht 60.0 in | Wt 148.0 lb

## 2022-10-10 DIAGNOSIS — M25552 Pain in left hip: Secondary | ICD-10-CM

## 2022-10-10 DIAGNOSIS — M1612 Unilateral primary osteoarthritis, left hip: Secondary | ICD-10-CM | POA: Diagnosis not present

## 2022-10-10 NOTE — Patient Instructions (Signed)
Tylenol [phone] mg 2-3 times a day for pain relief  Hip HEP  PT referral  4 week follow up

## 2022-10-21 ENCOUNTER — Encounter: Payer: Self-pay | Admitting: Cardiology

## 2022-10-21 MED ORDER — METOPROLOL TARTRATE 25 MG PO TABS: 25.0000 mg | ORAL_TABLET | ORAL | 3 refills | Status: AC | PRN

## 2022-10-24 NOTE — Therapy (Unsigned)
OUTPATIENT PHYSICAL THERAPY LOWER EXTREMITY EVALUATION   Patient Name: Tammy [name] MRN: [id number] DOB:May 22, 1940, 82 y.o., female Today's Date: 10/26/2022  END OF SESSION:  PT End of Session - 10/25/22 1241     Visit Number 1    Number of Visits 12    Date for PT Re-Evaluation 12/06/22    Authorization Type [employer]    PT Start Time 1235    PT Stop Time 1316    PT Time Calculation (min) 41 min    Activity Tolerance Patient tolerated treatment well    Behavior During Therapy WFL for tasks assessed/performed             Past Medical History:  Diagnosis Date   Hypothyroidism    Skin lesion of neck    Anterior Neck (Thyroid Tissue)   Thyroid disease    Past Surgical History:  Procedure Laterality Date   A-FLUTTER ABLATION N/A 07/11/2022   Procedure: A-FLUTTER ABLATION;  Surgeon: Lanier Prude, MD;  Location: [facility];  Service: Cardiovascular;  Laterality: N/A;   ANAL FISSURE REPAIR     broken elbow     pins   ELBOW SURGERY Right    EYE SURGERY     bilateral cataracts    FOOT SURGERY Left    LESION REMOVAL N/A 10/11/2017   Procedure: EXCISION ANTERIOR NECK SKIN LESION ERAS PATHWAY;  Surgeon: Abigail Miyamoto, MD;  Location: [facility];  Service: General;  Laterality: N/A;   needle removed from foot     THYROID SURGERY     TONSILLECTOMY     TUBAL LIGATION     Patient Active Problem List   Diagnosis Date Noted   Concussion 03/31/2020    PCP: Creola Corn MD   REFERRING PROVIDER: Richardean Sale, DO  REFERRING DIAG: [phone] (ICD-10-CM) - Left hip pain  THERAPY DIAG:  Pain in left hip  Stiffness of left hip, not elsewhere classified  Rationale for Evaluation and Treatment: Rehabilitation  ONSET DATE: chronic   SUBJECTIVE:   SUBJECTIVE STATEMENT: Pt think she has had pain in her L hip ongoing on and off for about 3 yrs. She has pain in her L hip on and off and today she was feeling good.  She states it is difficult for her to explain. She  has been using a book that was her husband's and it has helped Thea Silversmith).  She notes her pain is daily but not constant.  She mostly feels L hip discomfort and inability to walk after sitting for a period of time, especially when she sits.  She likes to sit cross-legged in the AM on her couch and this makes her pain worse when she goes to stand up. She endorses some weakness in her LLE and feels as if it used to give out back before COVID.  The pain does radiate sometimes to her shin.  She denies back pain for the most part but does have Rt thigh numbness as well as a place on her Rt upper back that goes numb at times as well.  She is not sure if there is a relationship to these symptoms.  She also complains of decreased stamina due, since her ablation.   PERTINENT HISTORY: Ablation for A fib. , HOH   PAIN:  Are you having pain? Yes: NPRS scale: not right this AM/10 Pain location: achy Pain description: L hip and prox thigh  Aggravating factors: walking, activity, needs time to get ready for the day  Relieving factors:  sitting,    PRECAUTIONS: None A-fib  WEIGHT BEARING RESTRICTIONS: No  FALLS:  Has patient fallen in last 6 months? No has had a fall in the past with a concussion and thinks maybe her L hip gave out and caused her to trip and fall   LIVING ENVIRONMENT: Lives with: lives alone, widowed Lives in: House/apartment Stairs: Yes: Internal: 12 steps; on left going up has to pull herself  Has following equipment at home: Ramped entry   OCCUPATION: was a Catering manager, retired   PLOF: Independent  PATIENT GOALS: I want to get rid of the pain   NEXT MD VISIT: as needed   OBJECTIVE:   DIAGNOSTIC FINDINGS: There is no evidence of hip fracture or dislocation. Joint space narrowing with sclerosis and osteophytes left hip. Lumbosacral degenerative changes.   IMPRESSION: Degenerative changes.  No acute osseous abnormalities.   PATIENT SURVEYS:  FOTO  58%  COGNITION: Overall cognitive status: Within functional limits for tasks assessed     SENSATION: WFL Does report numbness in tingling in Rt thigh   EDEMA:  NT  MUSCLE LENGTH: Hamstrings: WNL  Hip flexors, quads   POSTURE: rounded shoulders and forward head  PALPATION: Tender in L rectus femoris   LOWER EXTREMITY ROM:  Active ROM Right eval Left eval  Hip flexion    Hip extension    Hip abduction    Hip adduction    Hip internal rotation 20 15  Hip external rotation 60 50   Knee flexion    Knee extension    Ankle dorsiflexion    Ankle plantarflexion    Ankle inversion    Ankle eversion     (Blank rows = not tested)  LOWER EXTREMITY MMT:  MMT Right eval Left eval  Hip flexion    Hip extension    Hip abduction    Hip adduction    Hip internal rotation    Hip external rotation    Knee flexion    Knee extension    Ankle dorsiflexion    Ankle plantarflexion    Ankle inversion    Ankle eversion     (Blank rows = not tested)  LOWER EXTREMITY SPECIAL TESTS:  Hip special tests: Luisa Hart (FABER) test: negative, Thomas test: negative, Hip scouring test: positive , and FADIR pos. , groin pain with hip flexion as well   FUNCTIONAL TESTS:  5 times sit to stand: 9.8 sec   Unable to stand on each LE > 10 sec , incr effort on LLE  GAIT: Distance walked: 150 Assistive device utilized: None Level of assistance: Modified independence Comments: no limp, short stride length, mild lateral shifting to each side    TODAY'S TREATMENT:                                                                                                                              DATE: 10/25/22     PATIENT EDUCATION:  Education details: PT, POC,  HEP hip mobility, back symptoms relation to hip, anatomy  Person educated: Patient Education method: Explanation, Demonstration, and Handouts Education comprehension: verbalized understanding and needs further education  HOME EXERCISE  PROGRAM: Access Code: [email or web address] URL: [email or web address] Date: 10/25/2022 Prepared by: Karie Mainland  Exercises - Hip Flexor Stretch at [facility] of Bed  - 1 x daily - 7 x weekly - 1 sets - 3-5 reps - 30 hold - Supine Hip Internal and External Rotation  - 1 x daily - 7 x weekly - 2 sets - 10 reps - 5 hold  ASSESSMENT:  CLINICAL IMPRESSION: Patient is a 82 y.o. female who was seen today for physical therapy evaluation and treatment for L hip pain. There may be a component of lumbar involvement but given her XR and the groin pain hip is the main source of her pain.   OBJECTIVE IMPAIRMENTS: decreased activity tolerance, decreased endurance, decreased knowledge of use of DME, decreased mobility, difficulty walking, decreased ROM, decreased strength, hypomobility, increased fascial restrictions, impaired flexibility, impaired sensation, postural dysfunction, and pain.   ACTIVITY LIMITATIONS: lifting, bending, sitting, standing, squatting, sleeping, stairs, transfers, bed mobility, and locomotion level  PARTICIPATION LIMITATIONS: cleaning, laundry, interpersonal relationship, and community activity  PERSONAL FACTORS: Age and Time since onset of injury/illness/exacerbation are also affecting patient's functional outcome.   REHAB POTENTIAL: Excellent  CLINICAL DECISION MAKING: Stable/uncomplicated  EVALUATION COMPLEXITY: Low   GOALS:  SHORT TERM GOALS:    1. Pt will be I with initial HEP    Baseline: given on eval    Goal status: INITIAL   2.  Pt will be screened for endurance and balance and goal set   Baseline: NT on eval    Goal status: INITIAL    LONG TERM GOALS: Target date: 12/07/2022    Pt will be I with HEP for hips, core upon discharge  Baseline: unknown  Goal status: INITIAL  2.  Pt will be able to sit for 30 min with no more than min increase in hip pain as she stands up Baseline: 60 min pain mod to severe  Goal status: INITIAL  3.  Pt will be able to  walk around the block with RPE 6-7 and no increase in hip pain  Baseline: 1/2 block, gets winded easily  Goal status: INITIAL  4.  FOTO score will improve to 67% or better to demo improved functional mobility  Baseline: 58% Goal status: INITIAL    PLAN:  PT FREQUENCY: 2x/week  PT DURATION: 6-8 weeks  PLANNED INTERVENTIONS: Therapeutic exercises, Therapeutic activity, Neuromuscular re-education, Balance training, Gait training, Patient/Family education, Self Care, Joint mobilization, DME instructions, Dry Needling, Spinal mobilization, Cryotherapy, Moist heat, Ionotophoresis 4mg /ml Dexamethasone, Manual therapy, and Re-evaluation  PLAN FOR NEXT SESSION: check HEP.  2 vs 6 min walk test.  LE and core strength . Endurance    Adeleine Pask, PT 10/26/2022, 8:13 AM   Referring diagnosis? M25.552 (ICD-10-CM) - Left hip pain  Treatment diagnosis? (if different than referring diagnosis) L hip pain and L hip stiffness   What was this (referring dx) caused by? []  Surgery []  Fall [x]  Ongoing issue [x]  Arthritis []  Other: ____________  Laterality: []  Rt [x]  Lt []  Both  Check all possible CPT codes:  *CHOOSE 10 OR LESS*    []  44010 (Therapeutic Exercise)  []  92507 (SLP Treatment)  []  97112 (Neuro Re-ed)   []  92526 (Swallowing Treatment)   []  97116 (Gait Training)   []  [id number] (Cognitive Training, 1st 15 minutes) []  97140 (Manual Therapy)   []   09811 (Cognitive Training, each add'l 15 minutes)  []  91478 (Re-evaluation)                              []  Other, List CPT Code ____________  []  97530 (Therapeutic Activities)     []  97535 (Self Care)   [x]  All codes above (97110 - 97535)  []  97012 (Mechanical Traction)  []  97014 (E-stim Unattended)  []  97032 (E-stim manual)  []  97033 (Ionto)  []  97035 (Ultrasound) [x]  97750 (Physical Performance Training) []  [id number] (Aquatic Therapy) []  97016 (Vasopneumatic Device) []  [id number] (Paraffin) []  97034 (Contrast Bath) []  97597 (Wound Care 1st 20  sq cm) []  97598 (Wound Care each add'l 20 sq cm) []  97760 (Orthotic Fabrication, Fitting, Training Initial) []  [id number] (Prosthetic Management and Training Initial) []  [id number] (Orthotic or Prosthetic Training/ Modification Subsequent)   Karie Mainland, PT 10/26/22 8:13 AM Phone: [phone] Fax: [phone]

## 2022-10-25 ENCOUNTER — Ambulatory Visit: Payer: Medicare HMO | Attending: Sports Medicine | Admitting: Physical Therapy

## 2022-10-25 ENCOUNTER — Encounter: Payer: Self-pay | Admitting: Physical Therapy

## 2022-10-25 DIAGNOSIS — M25652 Stiffness of left hip, not elsewhere classified: Secondary | ICD-10-CM | POA: Insufficient documentation

## 2022-10-25 DIAGNOSIS — M25552 Pain in left hip: Secondary | ICD-10-CM | POA: Insufficient documentation

## 2022-10-26 ENCOUNTER — Encounter: Payer: Self-pay | Admitting: Physical Therapy

## 2022-11-07 NOTE — Progress Notes (Deleted)
    Aleen Sells D.[facility] [address] [place] [address] Phone: [phone]   Assessment and Plan:     There are no diagnoses linked to this encounter.  ***   Pertinent previous records reviewed include ***   Follow Up: ***     Subjective:   I,  , am serving as a Neurosurgeon for Doctor Richardean Sale   Chief Complaint: left leg pain    HPI:    10/10/2022 Patient is a 82 year old female complaining left leg pain. Patient states she has had intermittent pain for 3 year, she states that her leg used to give out on her back before COVID, when she does have pain it will be in her quad and at night it will travel down to her shin, no meds for the pain, she is not in pain now and can't explain what she feels , she feels the pain in her groin and no the hip but doesn't have pain ,   11/08/2022 Patient states   Relevant Historical Information: On chronic anticoagulation with Xarelto  Additional pertinent review of systems negative.   Current Outpatient Medications:    ascorbic acid (VITAMIN C) 500 MG tablet, Take 500 mg by mouth as needed (feel like getting a cold)., Disp: , Rfl:    Cholecalciferol (VITAMIN D3) 2000 units TABS, Take 2,000 Units by mouth daily., Disp: , Rfl:    EPINEPHrine 0.3 mg/0.3 mL IJ SOAJ injection, Inject 0.3 mg into the muscle daily as needed (for anaphylatic reactions (BEE STINGS)). For bee stings, Disp: , Rfl:    levothyroxine (SYNTHROID, LEVOTHROID) 125 MCG tablet, Take 125 mcg by mouth daily before breakfast., Disp: , Rfl:    Magnesium Citrate POWD, Take 1 Scoop by mouth at bedtime. 325 mg, Disp: , Rfl:    metoprolol tartrate (LOPRESSOR) 25 MG tablet, Take 1 tablet (25 mg total) by mouth as needed. Take 1 tablet (25 mg total) as needed for palpitations and elevated heart rate. May take up to two tablets per day., Disp: 180 tablet, Rfl: 3   Multiple Vitamin (MULTIVITAMIN WITH MINERALS) TABS tablet, Take 1  tablet by mouth daily., Disp: , Rfl:    Multiple Vitamins-Minerals (PRESERVISION AREDS 2 PO), Take 1 tablet by mouth daily., Disp: , Rfl:    olopatadine (PATADAY) 0.1 % ophthalmic solution, Place 1 drop into the left eye daily as needed for allergies., Disp: , Rfl:    rivaroxaban (XARELTO) 20 MG TABS tablet, Take 1 tablet (20 mg total) by mouth daily with supper., Disp: 30 tablet, Rfl: 11   Objective:     There were no vitals filed for this visit.    There is no height or weight on file to calculate BMI.    Physical Exam:    ***   Electronically signed by:  Aleen Sells [facility] 7:36 AM 11/07/22

## 2022-11-08 ENCOUNTER — Ambulatory Visit: Payer: Medicare HMO | Admitting: Sports Medicine

## 2022-11-09 ENCOUNTER — Ambulatory Visit: Payer: Medicare HMO | Admitting: Cardiology

## 2022-11-09 ENCOUNTER — Ambulatory Visit: Payer: Medicare HMO | Admitting: Physical Therapy

## 2022-11-09 ENCOUNTER — Other Ambulatory Visit: Payer: Self-pay | Admitting: Nurse Practitioner

## 2022-11-09 NOTE — Telephone Encounter (Signed)
Prescription refill request for Xarelto received.  Indication: PAF Last office visit: 08/09/22  Jeanie Cooks MD Weight: 67.4kg Age: 82 Scr: 0.63 on 06/27/22  Epic CrCl: 74.52  Based on above findings Xarelto 20mg  daily is the appropriate dose.  Refill approved.

## 2022-11-11 ENCOUNTER — Ambulatory Visit: Payer: Medicare HMO | Attending: Sports Medicine | Admitting: Physical Therapy

## 2022-11-11 ENCOUNTER — Encounter: Payer: Self-pay | Admitting: Physical Therapy

## 2022-11-11 DIAGNOSIS — M25652 Stiffness of left hip, not elsewhere classified: Secondary | ICD-10-CM | POA: Insufficient documentation

## 2022-11-11 DIAGNOSIS — M25552 Pain in left hip: Secondary | ICD-10-CM | POA: Insufficient documentation

## 2022-11-11 NOTE — Therapy (Signed)
OUTPATIENT PHYSICAL THERAPY TREATMENT   Patient Name: Tammy [name] MRN: [id number] DOB:[birth year], 82 y.o., female Today's Date: 11/11/2022  END OF SESSION:  PT End of Session - 11/11/22 1103     Visit Number 2    Number of Visits 12    Date for PT Re-Evaluation 12/06/22    Authorization Type [employer]    Authorization Time Period 7/10-8/10    Authorization - Visit Number 1    Authorization - Number of Visits 12    PT Start Time 1100    PT Stop Time 1158    PT Time Calculation (min) 58 min    Activity Tolerance Patient tolerated treatment well    Behavior During Therapy WFL for tasks assessed/performed              Past Medical History:  Diagnosis Date   Hypothyroidism    Skin lesion of neck    Anterior Neck (Thyroid Tissue)   Thyroid disease    Past Surgical History:  Procedure Laterality Date   A-FLUTTER ABLATION N/A 07/11/2022   Procedure: A-FLUTTER ABLATION;  Surgeon: Lanier Prude, MD;  Location: [facility];  Service: Cardiovascular;  Laterality: N/A;   ANAL FISSURE REPAIR     broken elbow     pins   ELBOW SURGERY Right    EYE SURGERY     bilateral cataracts    FOOT SURGERY Left    LESION REMOVAL N/A 10/11/2017   Procedure: EXCISION ANTERIOR NECK SKIN LESION ERAS PATHWAY;  Surgeon: Abigail Miyamoto, MD;  Location: [facility];  Service: General;  Laterality: N/A;   needle removed from foot     THYROID SURGERY     TONSILLECTOMY     TUBAL LIGATION     Patient Active Problem List   Diagnosis Date Noted   Concussion 03/31/2020    PCP: Creola Corn MD   REFERRING PROVIDER: Richardean Sale, DO  REFERRING DIAG: [phone] (ICD-10-CM) - Left hip pain  THERAPY DIAG:  Pain in left hip  Stiffness of left hip, not elsewhere classified  Rationale for Evaluation and Treatment: Rehabilitation  ONSET DATE: chronic   SUBJECTIVE:   SUBJECTIVE STATEMENT: Patient has no pain right now.  She sleeps with a towel roll and when she wakes up it is the  "best she feels all day".  Has difficulty when standing from sitting, transitioning.   PERTINENT HISTORY: Ablation for A fib. , HOH   PAIN:  Are you having pain? Yes: NPRS scale: 0-1/10 Pain location: achy Pain description: L hip and prox thigh  Aggravating factors: walking, activity, needs time to get ready for the day  Relieving factors: sitting,    PRECAUTIONS: None A-fib  WEIGHT BEARING RESTRICTIONS: No  FALLS:  Has patient fallen in last 6 months? No has had a fall in the past with a concussion and thinks maybe her L hip gave out and caused her to trip and fall   LIVING ENVIRONMENT: Lives with: lives alone, widowed Lives in: House/apartment Stairs: Yes: Internal: 12 steps; on left going up has to pull herself  Has following equipment at home: Ramped entry   OCCUPATION: was a Catering manager, retired   PLOF: Independent  PATIENT GOALS: I want to get rid of the pain   NEXT MD VISIT: as needed   OBJECTIVE:   DIAGNOSTIC FINDINGS: There is no evidence of hip fracture or dislocation. Joint space narrowing with sclerosis and osteophytes left hip. Lumbosacral degenerative changes.   IMPRESSION: Degenerative changes.  No  acute osseous abnormalities.   PATIENT SURVEYS:  FOTO 58%  COGNITION: Overall cognitive status: Within functional limits for tasks assessed     SENSATION: WFL Does report numbness in tingling in Rt thigh   EDEMA:  NT  MUSCLE LENGTH: Hamstrings: WNL  Hip flexors, quads   POSTURE: rounded shoulders and forward head  PALPATION: Tender in L rectus femoris   LOWER EXTREMITY ROM:  Active ROM Right eval Left eval  Hip flexion    Hip extension    Hip abduction    Hip adduction    Hip internal rotation 20 15  Hip external rotation 60 50   Knee flexion    Knee extension    Ankle dorsiflexion    Ankle plantarflexion    Ankle inversion    Ankle eversion     (Blank rows = not tested)  LOWER EXTREMITY MMT:  MMT Right eval Left eval   Hip flexion 4+ 4+ pain   Hip extension    Hip abduction 4- 3+  Hip adduction    Hip internal rotation    Hip external rotation    Knee flexion 5 5  Knee extension 5 5  Ankle dorsiflexion    Ankle plantarflexion    Ankle inversion    Ankle eversion     (Blank rows = not tested)  LOWER EXTREMITY SPECIAL TESTS:  Hip special tests: Luisa Hart (FABER) test: negative, Thomas test: negative, Hip scouring test: positive , and FADIR pos. , groin pain with hip flexion as well   FUNCTIONAL TESTS:  5 times sit to stand: 9.8 sec   Unable to stand on each LE > 10 sec , incr effort on LLE   2 min walk test 424 feet and no increased pain Difficulty when sit to stand, has to wait a moment , some weakness/soreness here   GAIT: Distance walked: 150 Assistive device utilized: None Level of assistance: Modified independence Comments: no limp, short stride length, mild lateral shifting to each side    TODAY'S TREATMENT:        [facility] Adult PT Treatment:                                                DATE: 11/11/22 Therapeutic Exercise: MMT see above  2 min walk test  Lower trunk rotation legs crossed Piriformis stretch (push/pull) 3 x 15-20 sec each side  Sidelying hip abduction x 15 Sidelying clam x 15   Standing step up x 15 each to high knee (quad)  Step back x 10 (6 inch) each (glute)  Manual Therapy: Prone soft tissue work to L low lumbar, glute med, piriformis Modalities: 10 min MHP L hip post  PT EVAL DATE: 10/25/22  PT eval, HEP, POC , self care    PATIENT EDUCATION:  Education details: PT, POC, HEP hip mobility, back symptoms relation to hip, anatomy  Person educated: Patient Education method: Explanation, Demonstration, and Handouts Education comprehension: verbalized understanding and needs further education  HOME EXERCISE PROGRAM: Access Code: [email or web address] URL:  [email or web address] Date: 11/11/2022 Prepared by: Karie Mainland  Exercises - Hip Flexor Stretch at [facility] of Bed  - 1 x daily - 7 x weekly - 1 sets - 3-5 reps - 30 hold - Supine Hip Internal and External Rotation  - 1 x daily - 7 x weekly - 2 sets - 10 reps - 5 hold - Supine Piriformis Stretch with Foot on Ground  - 1 x daily - 7 x weekly - 1 sets - 5 reps - 30 hold - Clamshell  - 1 x daily - 7 x weekly - 2 sets - 10 reps - 5 hold - Sidelying Hip Abduction  - 1 x daily - 7 x weekly - 2 sets - 10 reps - 5 hold   ASSESSMENT:  CLINICAL IMPRESSION: Patient had difficulty with Lt LE step back indicating L glute weakness, lacks pelvic stability on the L.  She was given HEP for lateral /post hip strengthening and further stretches  . She had significant tightness in L low lumbar paraspinals and L superior glute, glute med which she did not realize would be as painful.  Reduced pressure needed due to pain .  She left without pain and will continue to benefit from skilled therapy to further provide strength and hip mobility.    OBJECTIVE IMPAIRMENTS: decreased activity tolerance, decreased endurance, decreased knowledge of use of DME, decreased mobility, difficulty walking, decreased ROM, decreased strength, hypomobility, increased fascial restrictions, impaired flexibility, impaired sensation, postural dysfunction, and pain.   ACTIVITY LIMITATIONS: lifting, bending, sitting, standing, squatting, sleeping, stairs, transfers, bed mobility, and locomotion level  PARTICIPATION LIMITATIONS: cleaning, laundry, interpersonal relationship, and community activity  PERSONAL FACTORS: Age and Time since onset of injury/illness/exacerbation are also affecting patient's functional outcome.   REHAB POTENTIAL: Excellent  CLINICAL DECISION MAKING: Stable/uncomplicated  EVALUATION COMPLEXITY: Low   GOALS:  SHORT TERM GOALS:    1. Pt will be I with initial HEP    Baseline: given on eval     Goal status: INITIAL   2.  Pt will be screened for endurance and balance and goal set   Baseline: NT on eval    Goal status: INITIAL    LONG TERM GOALS: Target date: 12/07/2022    Pt will be I with HEP for hips, core upon discharge  Baseline: unknown  Goal status: INITIAL  2.  Pt will be able to sit for 30 min with no more than min increase in hip pain as she stands up Baseline: 60 min pain mod to severe  Goal status: INITIAL  3.  Pt will be able to walk around the block with RPE 6-7 and no increase in hip pain  Baseline: 1/2 block, gets winded easily  Goal status: INITIAL  4.  FOTO score will improve to 67% or better to demo improved functional mobility  Baseline: 58% Goal status: INITIAL    PLAN:  PT FREQUENCY: 2x/week  PT DURATION: 6-8 weeks  PLANNED INTERVENTIONS: Therapeutic exercises, Therapeutic activity, Neuromuscular re-education, Balance training, Gait training, Patient/Family education, Self Care, Joint mobilization, DME instructions, Dry Needling, Spinal mobilization, Cryotherapy, Moist heat, Ionotophoresis 4mg /ml Dexamethasone, Manual therapy, and Re-evaluation  PLAN FOR  NEXT SESSION: check HEP.   LE and core strength . Endurance . How was manual ?    Kiyoto Slomski, PT 11/11/2022, 12:10 PM   Karie Mainland, PT 11/11/22 12:10 PM Phone: [phone] Fax: [phone]

## 2022-11-14 ENCOUNTER — Ambulatory Visit: Payer: Medicare HMO | Admitting: Physical Therapy

## 2022-11-14 ENCOUNTER — Encounter: Payer: Self-pay | Admitting: Physical Therapy

## 2022-11-14 DIAGNOSIS — M25552 Pain in left hip: Secondary | ICD-10-CM | POA: Diagnosis not present

## 2022-11-14 DIAGNOSIS — M25652 Stiffness of left hip, not elsewhere classified: Secondary | ICD-10-CM | POA: Diagnosis not present

## 2022-11-14 NOTE — Therapy (Signed)
OUTPATIENT PHYSICAL THERAPY TREATMENT   Patient Name: Tammy [name] MRN: [id number] DOB:19-May-1940, 82 y.o., female Today's Date: 11/14/2022  END OF SESSION:  PT End of Session - 11/14/22 1107     Visit Number 3    Number of Visits 12    Date for PT Re-Evaluation 12/06/22    Authorization Type [employer]    Authorization Time Period 7/10-8/10    Authorization - Visit Number 2    Authorization - Number of Visits 12    PT Start Time 1100    PT Stop Time 1145    PT Time Calculation (min) 45 min    Activity Tolerance Patient tolerated treatment well    Behavior During Therapy [facility] for tasks assessed/performed              Past Medical History:  Diagnosis Date   Hypothyroidism    Skin lesion of neck    Anterior Neck (Thyroid Tissue)   Thyroid disease    Past Surgical History:  Procedure Laterality Date   A-FLUTTER ABLATION N/A 07/11/2022   Procedure: A-FLUTTER ABLATION;  Surgeon: Lanier Prude, MD;  Location: [facility];  Service: Cardiovascular;  Laterality: N/A;   ANAL FISSURE REPAIR     broken elbow     pins   ELBOW SURGERY Right    EYE SURGERY     bilateral cataracts    FOOT SURGERY Left    LESION REMOVAL N/A 10/11/2017   Procedure: EXCISION ANTERIOR NECK SKIN LESION ERAS PATHWAY;  Surgeon: Abigail Miyamoto, MD;  Location: [facility] OR;  Service: General;  Laterality: N/A;   needle removed from foot     THYROID SURGERY     TONSILLECTOMY     TUBAL LIGATION     Patient Active Problem List   Diagnosis Date Noted   Concussion 03/31/2020    PCP: Creola Corn MD   REFERRING PROVIDER: Richardean Sale, DO  REFERRING DIAG: [phone] (ICD-10-CM) - Left hip pain  THERAPY DIAG:  Pain in left hip  Stiffness of left hip, not elsewhere classified  Rationale for Evaluation and Treatment: Rehabilitation  ONSET DATE: chronic   SUBJECTIVE:   SUBJECTIVE STATEMENT: Not hurting tight now, had some difficulty getting out of the car.   PERTINENT  HISTORY: Ablation for A fib. , HOH   PAIN:  Are you having pain? Yes: NPRS scale: 0-1/10 Pain location: achy Pain description: L hip and prox thigh  Aggravating factors: walking, activity, needs time to get ready for the day  Relieving factors: sitting,    PRECAUTIONS: None A-fib  WEIGHT BEARING RESTRICTIONS: No  FALLS:  Has patient fallen in last 6 months? No has had a fall in the past with a concussion and thinks maybe her L hip gave out and caused her to trip and fall   LIVING ENVIRONMENT: Lives with: lives alone, widowed Lives in: House/apartment Stairs: Yes: Internal: 12 steps; on left going up has to pull herself  Has following equipment at home: Ramped entry   OCCUPATION: was a Catering manager, retired   PLOF: Independent  PATIENT GOALS: I want to get rid of the pain   NEXT MD VISIT: as needed   OBJECTIVE:   DIAGNOSTIC FINDINGS: There is no evidence of hip fracture or dislocation. Joint space narrowing with sclerosis and osteophytes left hip. Lumbosacral degenerative changes.   IMPRESSION: Degenerative changes.  No acute osseous abnormalities.   PATIENT SURVEYS:  FOTO 58%  COGNITION: Overall cognitive status: Within functional limits for tasks assessed  SENSATION: WFL Does report numbness in tingling in Rt thigh   EDEMA:  NT  MUSCLE LENGTH: Hamstrings: WNL  Hip flexors, quads   POSTURE: rounded shoulders and forward head  PALPATION: Tender in L rectus femoris   LOWER EXTREMITY ROM:  Active ROM Right eval Left eval  Hip flexion    Hip extension    Hip abduction    Hip adduction    Hip internal rotation 20 15  Hip external rotation 60 50   Knee flexion    Knee extension    Ankle dorsiflexion    Ankle plantarflexion    Ankle inversion    Ankle eversion     (Blank rows = not tested)  LOWER EXTREMITY MMT:  MMT Right eval Left eval  Hip flexion 4+ 4+ pain   Hip extension    Hip abduction 4- 3+  Hip adduction    Hip internal  rotation    Hip external rotation    Knee flexion 5 5  Knee extension 5 5  Ankle dorsiflexion    Ankle plantarflexion    Ankle inversion    Ankle eversion     (Blank rows = not tested)  LOWER EXTREMITY SPECIAL TESTS:  Hip special tests: Luisa Hart (FABER) test: negative, Thomas test: negative, Hip scouring test: positive , and FADIR pos. , groin pain with hip flexion as well   FUNCTIONAL TESTS:  5 times sit to stand: 9.8 sec   Unable to stand on each LE > 10 sec , incr effort on LLE   2 min walk test 424 feet and no increased pain Difficulty when sit to stand, has to wait a moment , some weakness/soreness here   GAIT: Distance walked: 150 Assistive device utilized: None Level of assistance: Modified independence Comments: no limp, short stride length, mild lateral shifting to each side    TODAY'S TREATMENT:         [facility] Adult PT Treatment:                                                DATE: 11/14/22 Therapeutic Exercise: NuStep LE and UE and 6 min, L4  Static Balance activities: near wall : SLS, tandem Dynamic balance gait with head turns, nods  Seated LAQ with ball squeeze x 15  Sit to stand x 15 with 10 lbs add chest press for 2nd set  Squat x 10 x 10 lbs, 2 sets  Wall sit 10 lbs  hold isometric 30 sec  Wall squats x 10  Lateral band walking green band 10 feet x 6  Piriformis stretch and pull   [facility] Adult PT Treatment:                                                DATE: 11/11/22 Therapeutic Exercise: MMT see above  2 min walk test  Lower trunk rotation legs crossed Piriformis stretch (push/pull) 3 x 15-20 sec each side  Sidelying hip abduction x 15 Sidelying clam x 15   Standing step up x 15 each to high knee (quad)  Step back x 10 (6 inch) each (glute)  Manual Therapy: Prone soft tissue work to L low lumbar, glute med, piriformis Modalities: 10 min MHP L  hip post                                                                                                                       PT EVAL DATE: 10/25/22  PT eval, HEP, POC , self care    PATIENT EDUCATION:  Education details: PT, POC, HEP hip mobility, back symptoms relation to hip, anatomy  Person educated: Patient Education method: Explanation, Demonstration, and Handouts Education comprehension: verbalized understanding and needs further education  HOME EXERCISE PROGRAM: Access Code: [email or web address] URL: [email or web address] Date: 11/11/2022 Prepared by: Karie Mainland  Exercises - Hip Flexor Stretch at [facility] of Bed  - 1 x daily - 7 x weekly - 1 sets - 3-5 reps - 30 hold - Supine Hip Internal and External Rotation  - 1 x daily - 7 x weekly - 2 sets - 10 reps - 5 hold - Supine Piriformis Stretch with Foot on Ground  - 1 x daily - 7 x weekly - 1 sets - 5 reps - 30 hold - Clamshell  - 1 x daily - 7 x weekly - 2 sets - 10 reps - 5 hold - Sidelying Hip Abduction  - 1 x daily - 7 x weekly - 2 sets - 10 reps - 5 hold   ASSESSMENT:  CLINICAL IMPRESSION:  Patient did well today, but noting no change in symptoms after manual therapy last session. MOst her issues are when she stands up from sitting for a period of time she feels like her LLE may given out. Balance impairments assessed today.  She did have mild difficulty with SLS and tandem stance, needing UE support every 2-5 sec.  Min impairments with dynamic gait, head turns and speed changes, turning. She sees Dr. Jean Rosenthal tomorrow.  Will discuss her back as a contributing factor.  She had quite severe pain in her L lumbar paraspinals when palpated last session (Friday).  She will continue to benefit from skilled physical therapy, integrating balance into her POC as able.    OBJECTIVE IMPAIRMENTS: decreased activity tolerance, decreased endurance, decreased knowledge of use of DME, decreased mobility, difficulty walking, decreased ROM, decreased strength, hypomobility, increased fascial restrictions, impaired flexibility, impaired sensation,  postural dysfunction, and pain.   ACTIVITY LIMITATIONS: lifting, bending, sitting, standing, squatting, sleeping, stairs, transfers, bed mobility, and locomotion level  PARTICIPATION LIMITATIONS: cleaning, laundry, interpersonal relationship, and community activity  PERSONAL FACTORS: Age and Time since onset of injury/illness/exacerbation are also affecting patient's functional outcome.   REHAB POTENTIAL: Excellent  CLINICAL DECISION MAKING: Stable/uncomplicated  EVALUATION COMPLEXITY: Low   GOALS:  SHORT TERM GOALS:    1. Pt will be I with initial HEP    Baseline: given on eval    Goal status: ongoing: up to date    2.  Pt will be screened for endurance and balance and goal set   Baseline: NT on eval    Goal status: MET    LONG TERM GOALS: Target date: 12/07/2022    Pt will  be I with HEP for hips, core upon discharge  Baseline: unknown  Goal status: INITIAL  2.  Pt will be able to sit for 30 min with no more than min increase in hip pain as she stands up Baseline: 60 min pain mod to severe  Goal status:ongoing   3.  Pt will be able to walk around the block with RPE 6-7 and no increase in hip pain  Baseline: 1/2 block, gets winded easily  Goal status: INITIAL  4.  FOTO score will improve to 67% or better to demo improved functional mobility  Baseline: 58% Goal status: INITIAL  5. Patient will be able to stand in tandem as well as single leg for > 15 sec each without UE support  Baseline: < 10 sec   Goal Status: NEW  6.  Patient will be able to maintain balance while changing directions and turning head to mimic community environment.   Baseline: min difficulty   Goal status NEW    PLAN:  PT FREQUENCY: 2x/week  PT DURATION: 6-8 weeks  PLANNED INTERVENTIONS: Therapeutic exercises, Therapeutic activity, Neuromuscular re-education, Balance training, Gait training, Patient/Family education, Self Care, Joint mobilization, DME instructions, Dry Needling, Spinal  mobilization, Cryotherapy, Moist heat, Ionotophoresis 4mg /ml Dexamethasone, Manual therapy, and Re-evaluation  PLAN FOR NEXT SESSION: progress HEP.  LE and core strength . Endurance   Tammy [name], PT 11/14/2022, 11:16 AM   Karie Mainland, PT 11/14/22 11:16 AM Phone: [phone] Fax: [phone]

## 2022-11-14 NOTE — Progress Notes (Unsigned)
    Tammy [name] D.[facility] [address] [place] [address] Phone: [phone]   Assessment and Plan:     There are no diagnoses linked to this encounter.  ***   Pertinent previous records reviewed include ***   Follow Up: ***     Subjective:   I, Tammy [name], am serving as a Neurosurgeon for Doctor Richardean Sale   Chief Complaint: left leg pain    HPI:    10/10/2022 Patient is a 82 year old female complaining left leg pain. Patient states she has had intermittent pain for 3 year, she states that her leg used to give out on her back before COVID, when she does have pain it will be in her quad and at night it will travel down to her shin, no meds for the pain, she is not in pain now and can't explain what she feels , she feels the pain in her groin and no the hip but doesn't have pain ,   11/15/2022 Patient states    Relevant Historical Information: On chronic anticoagulation with Xarelto  Additional pertinent review of systems negative.   Current Outpatient Medications:    ascorbic acid (VITAMIN C) 500 MG tablet, Take 500 mg by mouth as needed (feel like getting a cold)., Disp: , Rfl:    Cholecalciferol (VITAMIN D3) 2000 units TABS, Take 2,000 Units by mouth daily., Disp: , Rfl:    EPINEPHrine 0.3 mg/0.3 mL IJ SOAJ injection, Inject 0.3 mg into the muscle daily as needed (for anaphylatic reactions (BEE STINGS)). For bee stings, Disp: , Rfl:    levothyroxine (SYNTHROID, LEVOTHROID) 125 MCG tablet, Take 125 mcg by mouth daily before breakfast., Disp: , Rfl:    Magnesium Citrate POWD, Take 1 Scoop by mouth at bedtime. 325 mg, Disp: , Rfl:    metoprolol tartrate (LOPRESSOR) 25 MG tablet, Take 1 tablet (25 mg total) by mouth as needed. Take 1 tablet (25 mg total) as needed for palpitations and elevated heart rate. May take up to two tablets per day., Disp: 180 tablet, Rfl: 3   Multiple Vitamin (MULTIVITAMIN WITH MINERALS) TABS tablet,  Take 1 tablet by mouth daily., Disp: , Rfl:    Multiple Vitamins-Minerals (PRESERVISION AREDS 2 PO), Take 1 tablet by mouth daily., Disp: , Rfl:    olopatadine (PATADAY) 0.1 % ophthalmic solution, Place 1 drop into the left eye daily as needed for allergies., Disp: , Rfl:    rivaroxaban (XARELTO) 20 MG TABS tablet, TAKE 1 TABLET BY MOUTH ONCE DAILY WITH SUPPER, Disp: 90 tablet, Rfl: 1   Objective:     There were no vitals filed for this visit.    There is no height or weight on file to calculate BMI.    Physical Exam:    ***   Electronically signed by:  Tammy [name] D.[facility] 2:19 PM 11/14/22

## 2022-11-15 ENCOUNTER — Encounter: Payer: Self-pay | Admitting: Cardiology

## 2022-11-15 ENCOUNTER — Ambulatory Visit: Payer: Medicare HMO | Admitting: Sports Medicine

## 2022-11-15 ENCOUNTER — Ambulatory Visit (INDEPENDENT_AMBULATORY_CARE_PROVIDER_SITE_OTHER): Payer: Medicare HMO

## 2022-11-15 VITALS — BP 120/80 | HR 74 | Ht 60.0 in | Wt 145.0 lb

## 2022-11-15 DIAGNOSIS — M545 Low back pain, unspecified: Secondary | ICD-10-CM

## 2022-11-15 DIAGNOSIS — G8929 Other chronic pain: Secondary | ICD-10-CM

## 2022-11-15 DIAGNOSIS — M25552 Pain in left hip: Secondary | ICD-10-CM | POA: Diagnosis not present

## 2022-11-15 DIAGNOSIS — H04123 Dry eye syndrome of bilateral lacrimal glands: Secondary | ICD-10-CM | POA: Diagnosis not present

## 2022-11-15 DIAGNOSIS — H18513 Endothelial corneal dystrophy, bilateral: Secondary | ICD-10-CM | POA: Diagnosis not present

## 2022-11-15 DIAGNOSIS — Z961 Presence of intraocular lens: Secondary | ICD-10-CM | POA: Diagnosis not present

## 2022-11-15 DIAGNOSIS — M1612 Unilateral primary osteoarthritis, left hip: Secondary | ICD-10-CM

## 2022-11-15 DIAGNOSIS — H26493 Other secondary cataract, bilateral: Secondary | ICD-10-CM | POA: Diagnosis not present

## 2022-11-15 DIAGNOSIS — M47816 Spondylosis without myelopathy or radiculopathy, lumbar region: Secondary | ICD-10-CM | POA: Diagnosis not present

## 2022-11-15 NOTE — Patient Instructions (Signed)
Tylenol [phone] mg 2-3 times a day for pain relief  Continue HEP and PT  3 week follow up

## 2022-11-16 ENCOUNTER — Ambulatory Visit: Payer: Medicare HMO | Admitting: Physical Therapy

## 2022-11-16 ENCOUNTER — Encounter: Payer: Self-pay | Admitting: Physical Therapy

## 2022-11-16 DIAGNOSIS — M25652 Stiffness of left hip, not elsewhere classified: Secondary | ICD-10-CM

## 2022-11-16 DIAGNOSIS — M25552 Pain in left hip: Secondary | ICD-10-CM

## 2022-11-16 NOTE — Therapy (Signed)
OUTPATIENT PHYSICAL THERAPY TREATMENT   Patient Name: Tammy [name] MRN: [id number] DOB:1940/12/06, 82 y.o., female Today's Date: 11/16/2022  END OF SESSION:  PT End of Session - 11/16/22 1106     Visit Number 4    Number of Visits 12    Date for PT Re-Evaluation 12/06/22    Authorization Type [employer]    Authorization Time Period 7/10-8/10    Authorization - Visit Number 3    Authorization - Number of Visits 12    PT Start Time 1105    PT Stop Time 1145    PT Time Calculation (min) 40 min              Past Medical History:  Diagnosis Date   Hypothyroidism    Skin lesion of neck    Anterior Neck (Thyroid Tissue)   Thyroid disease    Past Surgical History:  Procedure Laterality Date   A-FLUTTER ABLATION N/A 07/11/2022   Procedure: A-FLUTTER ABLATION;  Surgeon: Lanier Prude, MD;  Location: [facility];  Service: Cardiovascular;  Laterality: N/A;   ANAL FISSURE REPAIR     broken elbow     pins   ELBOW SURGERY Right    EYE SURGERY     bilateral cataracts    FOOT SURGERY Left    LESION REMOVAL N/A 10/11/2017   Procedure: EXCISION ANTERIOR NECK SKIN LESION ERAS PATHWAY;  Surgeon: Abigail Miyamoto, MD;  Location: [facility] OR;  Service: General;  Laterality: N/A;   needle removed from foot     THYROID SURGERY     TONSILLECTOMY     TUBAL LIGATION     Patient Active Problem List   Diagnosis Date Noted   Concussion 03/31/2020    PCP: Creola Corn MD   REFERRING PROVIDER: Richardean Sale, DO  REFERRING DIAG: [phone] (ICD-10-CM) - Left hip pain  THERAPY DIAG:  Pain in left hip  Stiffness of left hip, not elsewhere classified  Rationale for Evaluation and Treatment: Rehabilitation  ONSET DATE: chronic   SUBJECTIVE:   SUBJECTIVE STATEMENT: Not hurting tight now, had some difficulty getting out of the car.   PERTINENT HISTORY: Ablation for A fib. , HOH   PAIN:  Are you having pain? Yes: NPRS scale: 5/10 Pain location: achy Pain description: L  hip and prox thigh  Aggravating factors: walking, activity, needs time to get ready for the day  Relieving factors: sitting,    PRECAUTIONS: None A-fib  WEIGHT BEARING RESTRICTIONS: No  FALLS:  Has patient fallen in last 6 months? No has had a fall in the past with a concussion and thinks maybe her L hip gave out and caused her to trip and fall   LIVING ENVIRONMENT: Lives with: lives alone, widowed Lives in: House/apartment Stairs: Yes: Internal: 12 steps; on left going up has to pull herself  Has following equipment at home: Ramped entry   OCCUPATION: was a Catering manager, retired   PLOF: Independent  PATIENT GOALS: I want to get rid of the pain   NEXT MD VISIT: as needed   OBJECTIVE:   DIAGNOSTIC FINDINGS: There is no evidence of hip fracture or dislocation. Joint space narrowing with sclerosis and osteophytes left hip. Lumbosacral degenerative changes.   IMPRESSION: Degenerative changes.  No acute osseous abnormalities.   PATIENT SURVEYS:  FOTO 58%    COGNITION: Overall cognitive status: Within functional limits for tasks assessed     SENSATION: WFL Does report numbness in tingling in Rt thigh   EDEMA:  NT  MUSCLE LENGTH: Hamstrings: WNL  Hip flexors, quads   POSTURE: rounded shoulders and forward head  PALPATION: Tender in L rectus femoris   LOWER EXTREMITY ROM:  Active ROM Right eval Left eval  Hip flexion    Hip extension    Hip abduction    Hip adduction    Hip internal rotation 20 15  Hip external rotation 60 50   Knee flexion    Knee extension    Ankle dorsiflexion    Ankle plantarflexion    Ankle inversion    Ankle eversion     (Blank rows = not tested)  LOWER EXTREMITY MMT:  MMT Right eval Left eval  Hip flexion 4+ 4+ pain   Hip extension    Hip abduction 4- 3+  Hip adduction    Hip internal rotation    Hip external rotation    Knee flexion 5 5  Knee extension 5 5  Ankle dorsiflexion    Ankle plantarflexion    Ankle  inversion    Ankle eversion     (Blank rows = not tested)  LOWER EXTREMITY SPECIAL TESTS:  Hip special tests: Luisa Hart (FABER) test: negative, Thomas test: negative, Hip scouring test: positive , and FADIR pos. , groin pain with hip flexion as well   FUNCTIONAL TESTS:  5 times sit to stand: 9.8 sec   Unable to stand on each LE > 10 sec , incr effort on LLE   2 min walk test 424 feet and no increased pain Difficulty when sit to stand, has to wait a moment , some weakness/soreness here   GAIT: Distance walked: 150 Assistive device utilized: None Level of assistance: Modified independence Comments: no limp, short stride length, mild lateral shifting to each side    TODAY'S TREATMENT:       [facility] Adult PT Treatment:                                                DATE: 11/16/22 Therapeutic Exercise: Nustep L4 UE/LE x 5 minutes  Supine clam Green band  Banded Bridge  Supine marching HL ball squeeze  Bridge with ball squeeze  LTR with legs crossed STS x 10 with cues for control  STS 10 # KB x 10 Tandem on AIREX Narrow on AIREX with head turns  AIREX Marching without UE   [facility] Adult PT Treatment:                                                DATE: 11/14/22 Therapeutic Exercise: NuStep LE and UE and 6 min, L4  Static Balance activities: near wall : SLS, tandem Dynamic balance gait with head turns, nods  Seated LAQ with ball squeeze x 15  Sit to stand x 15 with 10 lbs add chest press for 2nd set  Squat x 10 x 10 lbs, 2 sets  Wall sit 10 lbs  hold isometric 30 sec  Wall squats x 10  Lateral band walking green band 10 feet x 6  Piriformis stretch and pull   [facility] Adult PT Treatment:  DATE: 11/11/22 Therapeutic Exercise: MMT see above  2 min walk test  Lower trunk rotation legs crossed Piriformis stretch (push/pull) 3 x 15-20 sec each side  Sidelying hip abduction x 15 Sidelying clam x 15   Standing step up x 15 each to high knee  (quad)  Step back x 10 (6 inch) each (glute)  Manual Therapy: Prone soft tissue work to L low lumbar, glute med, piriformis Modalities: 10 min MHP L hip post                                                                                                                      PT EVAL DATE: 10/25/22  PT eval, HEP, POC , self care    PATIENT EDUCATION:  Education details: PT, POC, HEP hip mobility, back symptoms relation to hip, anatomy  Person educated: Patient Education method: Explanation, Demonstration, and Handouts Education comprehension: verbalized understanding and needs further education  HOME EXERCISE PROGRAM: Access Code: [email or web address] URL: [email or web address] Date: 11/11/2022 Prepared by: Karie Mainland  Exercises - Hip Flexor Stretch at [facility] of Bed  - 1 x daily - 7 x weekly - 1 sets - 3-5 reps - 30 hold - Supine Hip Internal and External Rotation  - 1 x daily - 7 x weekly - 2 sets - 10 reps - 5 hold - Supine Piriformis Stretch with Foot on Ground  - 1 x daily - 7 x weekly - 1 sets - 5 reps - 30 hold - Clamshell  - 1 x daily - 7 x weekly - 2 sets - 10 reps - 5 hold - Sidelying Hip Abduction  - 1 x daily - 7 x weekly - 2 sets - 10 reps - 5 hold   ASSESSMENT:  CLINICAL IMPRESSION: Pt reports she saw MD yesterday who xrayed her back and saw mild arthritis. He offered an injection for her severe hip arthritis or prednisone, which she declined for now. He recommended increased Tylenol. Upon standing in lobby pt demonstrated antalgic gait pattern and reported increased pain and hesitancy to bear full weight on LLE. It improved after walking into clinic. Continued with hip/lumbar stabilization and balance with good tolerance. She does report right anterior thigh numbness which she forgot to mention to her MD.  She will continue to benefit from skilled physical therapy, integrating balance into her POC as able.    OBJECTIVE IMPAIRMENTS: decreased activity tolerance,  decreased endurance, decreased knowledge of use of DME, decreased mobility, difficulty walking, decreased ROM, decreased strength, hypomobility, increased fascial restrictions, impaired flexibility, impaired sensation, postural dysfunction, and pain.   ACTIVITY LIMITATIONS: lifting, bending, sitting, standing, squatting, sleeping, stairs, transfers, bed mobility, and locomotion level  PARTICIPATION LIMITATIONS: cleaning, laundry, interpersonal relationship, and community activity  PERSONAL FACTORS: Age and Time since onset of injury/illness/exacerbation are also affecting patient's functional outcome.   REHAB POTENTIAL: Excellent  CLINICAL DECISION MAKING: Stable/uncomplicated  EVALUATION COMPLEXITY: Low   GOALS:  SHORT TERM GOALS:    1. Pt will be I with  initial HEP    Baseline: given on eval    Goal status: ongoing: up to date    2.  Pt will be screened for endurance and balance and goal set   Baseline: NT on eval    Goal status: MET    LONG TERM GOALS: Target date: 12/07/2022    Pt will be I with HEP for hips, core upon discharge  Baseline: unknown  Goal status: INITIAL  2.  Pt will be able to sit for 30 min with no more than min increase in hip pain as she stands up Baseline: 60 min pain mod to severe  Goal status:ongoing   3.  Pt will be able to walk around the block with RPE 6-7 and no increase in hip pain  Baseline: 1/2 block, gets winded easily  Goal status: INITIAL  4.  FOTO score will improve to 67% or better to demo improved functional mobility  Baseline: 58% Goal status: INITIAL  5. Patient will be able to stand in tandem as well as single leg for > 15 sec each without UE support  Baseline: < 10 sec   Goal Status: NEW  6.  Patient will be able to maintain balance while changing directions and turning head to mimic community environment.   Baseline: min difficulty   Goal status NEW    PLAN:  PT FREQUENCY: 2x/week  PT DURATION: 6-8 weeks  PLANNED  INTERVENTIONS: Therapeutic exercises, Therapeutic activity, Neuromuscular re-education, Balance training, Gait training, Patient/Family education, Self Care, Joint mobilization, DME instructions, Dry Needling, Spinal mobilization, Cryotherapy, Moist heat, Ionotophoresis 4mg /ml Dexamethasone, Manual therapy, and Re-evaluation  PLAN FOR NEXT SESSION: progress HEP.  LE and core strength . Endurance   Jannette Spanner, [place] 11/16/22 12:57 PM Phone: [phone] Fax: [phone]

## 2022-11-16 NOTE — Progress Notes (Signed)
Electrophysiology Office Follow up Visit Note:    Date:  11/17/2022   ID:  Tammy [name], DOB 07/28/1940, MRN [id number]  PCP:  Creola Corn, MD  [facility] Cardiologist:  Meriam Sprague, MD  [facility] Electrophysiologist:  Lanier Prude, MD    Interval History:    Tammy [name] is a 82 y.o. female who presents for a follow up visit.   Last seen August 09, 2022 for her history of atrial flutter.  The patient has a history of a flutter ablation on July 11, 2022.  She has had some episodes of atrial fibrillation.  She takes Xarelto for stroke prophylaxis but is interested in stopping the medication if no episodes of sustained atrial fibrillation moving forward.  We have discussed loop recorder monitoring in detail in the past.       Past medical, surgical, social and family history were reviewed.  ROS:   Please see the history of present illness.    All other systems reviewed and are negative.  EKGs/Labs/Other Studies Reviewed:    The following studies were reviewed today:    EKG Interpretation Date/Time:  Thursday November 17 2022 08:06:33 EDT Ventricular Rate:  69 PR Interval:  146 QRS Duration:  86 QT Interval:  392 QTC Calculation: 420 R Axis:   48  Text Interpretation: Normal sinus rhythm Normal ECG Confirmed by Steffanie Dunn [phone]) on 11/17/2022 8:17:12 AM    Physical Exam:    VS:  BP (!) 142/80   Pulse 69   Ht 5' (1.524 m)   Wt 146 lb (66.2 kg)   SpO2 96%   BMI 28.51 kg/m     Wt Readings from Last 3 Encounters:  11/17/22 146 lb (66.2 kg)  11/15/22 145 lb (65.8 kg)  10/10/22 148 lb (67.1 kg)     GEN:  Well nourished, well developed in no acute distress CARDIAC: RRR, no murmurs, rubs, gallops RESPIRATORY:  Clear to auscultation without rales, wheezing or rhonchi       ASSESSMENT:    1. Typical atrial flutter (HCC)   2. PAF (paroxysmal atrial fibrillation) (HCC)    PLAN:    In order of problems listed above:  #Atrial  flutter Doing well after her July 11, 2022 ablation.  No recurrent episodes of flutter. She is on Xarelto for stroke prophylaxis but would like to avoid prolonged use if no recurrent arrhythmias.  We have discussed the role of loop recorder monitoring in detail and she wishes to proceed.  She understands the risks of the procedure and the monthly monitoring cost. I would like her to continue on Xarelto for at least 3 months while we monitor her rhythm using the loop recorder.  If there are no episodes of atrial fibrillation or flutter during those 3 months, she can stop the Xarelto and start taking aspirin 81 mg by mouth daily.  If she has atrial fibrillation, we would want to revisit treatment strategies in the clinic.  Follow-up in 12 months with APP.      Signed, Steffanie Dunn, MD, [facility], [facility] 11/17/2022 8:17 AM    Electrophysiology [facility]  ----------------------------  SURGEON:  Lanier Prude, MD     PREPROCEDURE DIAGNOSIS:  Atrial fibrillation    POSTPROCEDURE DIAGNOSIS: Atrial fibrillation     PROCEDURES:   1. Implantable loop recorder implantation    INTRODUCTION:  Tammy [name] presents with a history of atrial fibrillation The costs of loop recorder monitoring have been  discussed with the patient.    DESCRIPTION OF PROCEDURE:  Informed written consent was obtained.  A preprocedural timeout was performed with the RN Wenda Low). The patient required no sedation for the procedure today.  Mapping over the patient's chest was performed to identify the area where electrograms were most prominent for ILR recording.  This area was found to be the left parasternal region over the 4th intercostal space. The patients left chest was therefore prepped and draped in the usual sterile fashion. The skin overlying the left parasternal region was infiltrated with lidocaine for local analgesia.  A 0.5-cm incision was made over the left parasternal region over the  3rd intercostal space.  A subcutaneous ILR pocket was fashioned using a combination of sharp and blunt dissection.  A Abbott Assert IQ EL+ ([id number])implantable loop recorder was then placed into the pocket  R waves were very prominent and measured >0.83mV.  Steri- Strips and a sterile dressing were then applied.  There were no early apparent complications.     CONCLUSIONS:   1. Successful implantation of a implantable loop recorder for Atrial fibrillation  2. No early apparent complications.   Sheria Lang T. Lalla Brothers, MD, [facility], [facility] Cardiac Electrophysiology

## 2022-11-17 ENCOUNTER — Ambulatory Visit: Payer: Medicare HMO | Attending: Cardiology | Admitting: Cardiology

## 2022-11-17 ENCOUNTER — Encounter: Payer: Self-pay | Admitting: Cardiology

## 2022-11-17 VITALS — BP 142/80 | HR 69 | Ht 60.0 in | Wt 146.0 lb

## 2022-11-17 DIAGNOSIS — I483 Typical atrial flutter: Secondary | ICD-10-CM

## 2022-11-17 DIAGNOSIS — I480 Paroxysmal atrial fibrillation: Secondary | ICD-10-CM

## 2022-11-17 NOTE — Patient Instructions (Addendum)
Medication Instructions:  Your physician has recommended you make the following change in your medication:  1) STOP taking your xarelto on October 18th   Labwork: None ordered.  Testing/Procedures: None ordered.  Follow-Up:  Your physician wants you to follow-up in: one year with EP APP.  You will receive a reminder letter in the mail two months in advance. If you don't receive a letter, please call our office to schedule the follow-up appointment.    Implantable Loop Recorder Placement, Care After This sheet gives you information about how to care for yourself after your procedure. Your health care provider may also give you more specific instructions. If you have problems or questions, contact your health care provider. What can I expect after the procedure? After the procedure, it is common to have: Soreness or discomfort near the incision. Some swelling or bruising near the incision.  Follow these instructions at home: Incision care  Monitor your cardiac device site for redness, swelling, and drainage. Call the device clinic at [phone] if you experience these symptoms or fever/chills.  Keep the large square bandage on your site for 24 hours and then you may remove it yourself. Keep the steri-strips underneath in place.   You may shower after 72 hours / 3 days from your procedure with the steri-strips in place. They will usually fall off on their own, or may be removed after 10 days. Pat dry.   Avoid lotions, ointments, or perfumes over your incision until it is well-healed.  Please do not submerge in water until your site is completely healed.   Your device is MRI compatible.   Remote monitoring is used to monitor your cardiac device from home. This monitoring is scheduled every month by our office. It allows [place] to keep an eye on the function of your device to ensure it is working properly.  If your wound site starts to bleed apply pressure.    For help with the  monitor please call [employer] Monitor Support Specialist directly at [phone].    If you have any questions/concerns please call the device clinic at [phone].  Activity  Return to your normal activities.  General instructions Follow instructions from your health care provider about how to manage your implantable loop recorder and transmit the information. Learn how to activate a recording if this is necessary for your type of device. You may go through a metal detection gate, and you may let someone hold a metal detector over your chest. Show your ID card if needed. Do not have an MRI unless you check with your health care provider first. Take over-the-counter and prescription medicines only as told by your health care provider. Keep all follow-up visits as told by your health care provider. This is important. Contact a health care provider if: You have redness, swelling, or pain around your incision. You have a fever. You have pain that is not relieved by your pain medicine. You have triggered your device because of fainting (syncope) or because of a heartbeat that feels like it is racing, slow, fluttering, or skipping (palpitations). Get help right away if you have: Chest pain. Difficulty breathing. Summary After the procedure, it is common to have soreness or discomfort near the incision. Change your dressing as told by your health care provider. Follow instructions from your health care provider about how to manage your implantable loop recorder and transmit the information. Keep all follow-up visits as told by your health care provider. This is important. This information is  not intended to replace advice given to you by your health care provider. Make sure you discuss any questions you have with your health care provider. Document Released: 03/30/2015 Document Revised: 06/03/2017 Document Reviewed: 06/03/2017 Elsevier Patient Education  2020 [employer].

## 2022-11-21 ENCOUNTER — Encounter: Payer: Self-pay | Admitting: Physical Therapy

## 2022-11-21 ENCOUNTER — Ambulatory Visit: Payer: Medicare HMO | Admitting: Physical Therapy

## 2022-11-21 ENCOUNTER — Encounter: Payer: Self-pay | Admitting: Cardiology

## 2022-11-21 DIAGNOSIS — M25552 Pain in left hip: Secondary | ICD-10-CM | POA: Diagnosis not present

## 2022-11-21 DIAGNOSIS — M25652 Stiffness of left hip, not elsewhere classified: Secondary | ICD-10-CM

## 2022-11-21 NOTE — Therapy (Signed)
OUTPATIENT PHYSICAL THERAPY TREATMENT   Patient Name: Tammy [name] MRN: [id number] DOB:Jan 19, 1941, 82 y.o., female Today's Date: 11/21/2022  END OF SESSION:  PT End of Session - 11/21/22 1135     Visit Number 5    Number of Visits 12    Date for PT Re-Evaluation 12/06/22    Authorization Type [employer]    Authorization Time Period 7/10-8/10    Authorization - Visit Number 5    Authorization - Number of Visits 12    PT Start Time 1145    PT Stop Time 1230    PT Time Calculation (min) 45 min    Activity Tolerance Patient tolerated treatment well    Behavior During Therapy [facility] for tasks assessed/performed               Past Medical History:  Diagnosis Date   Hypothyroidism    Skin lesion of neck    Anterior Neck (Thyroid Tissue)   Thyroid disease    Past Surgical History:  Procedure Laterality Date   A-FLUTTER ABLATION N/A 07/11/2022   Procedure: A-FLUTTER ABLATION;  Surgeon: Lanier Prude, MD;  Location: [facility] INVASIVE CV LAB;  Service: Cardiovascular;  Laterality: N/A;   ANAL FISSURE REPAIR     broken elbow     pins   ELBOW SURGERY Right    EYE SURGERY     bilateral cataracts    FOOT SURGERY Left    LESION REMOVAL N/A 10/11/2017   Procedure: EXCISION ANTERIOR NECK SKIN LESION ERAS PATHWAY;  Surgeon: Abigail Miyamoto, MD;  Location: [facility] OR;  Service: General;  Laterality: N/A;   needle removed from foot     THYROID SURGERY     TONSILLECTOMY     TUBAL LIGATION     Patient Active Problem List   Diagnosis Date Noted   Concussion 03/31/2020    PCP: Creola Corn MD   REFERRING PROVIDER: Richardean Sale, DO  REFERRING DIAG: [phone] (ICD-10-CM) - Left hip pain  THERAPY DIAG:  Pain in left hip  Stiffness of left hip, not elsewhere classified  Rationale for Evaluation and Treatment: Rehabilitation  ONSET DATE: chronic   SUBJECTIVE:   SUBJECTIVE STATEMENT: I am hurting today and I feel like I am getting worse.  I have done the first 3 exercises.  I  don't know if exercise is what I need.  I feel like it is really nerve pain. I am not sure that I can do anything that makes it better.  In the middle of all of this exercise and PT I got my the cardiac loop implanted Thursday July 18th after my ablation in March of 2024. I forgot to tell you all. Sometimes I feel like my legs feel like lead due to my blood thinner.  (PT directed pt back to cardiologist)  PERTINENT HISTORY: Ablation for A fib. , HOH   PAIN:  Are you having pain? Yes: NPRS scale: 5/10 Pain location: achy Pain description: L hip and prox thigh  Aggravating factors: walking, activity, needs time to get ready for the day  Relieving factors: sitting,    PRECAUTIONS: None A-fib  WEIGHT BEARING RESTRICTIONS: No  FALLS:  Has patient fallen in last 6 months? No has had a fall in the past with a concussion and thinks maybe her L hip gave out and caused her to trip and fall   LIVING ENVIRONMENT: Lives with: lives alone, widowed Lives in: House/apartment Stairs: Yes: Internal: 12 steps; on left going up has to pull  herself  Has following equipment at home: Ramped entry   OCCUPATION: was a bookkeeper, retired   PLOF: Independent  PATIENT GOALS: I want to get rid of the pain   NEXT MD VISIT: as needed   OBJECTIVE:   DIAGNOSTIC FINDINGS: There is no evidence of hip fracture or dislocation. Joint space narrowing with sclerosis and osteophytes left hip. Lumbosacral degenerative changes.   IMPRESSION: Degenerative changes.  No acute osseous abnormalities.   PATIENT SURVEYS:  FOTO 58%    COGNITION: Overall cognitive status: Within functional limits for tasks assessed     SENSATION: WFL Does report numbness in tingling in Rt thigh   EDEMA:  NT  MUSCLE LENGTH: Hamstrings: WNL  Hip flexors, quads   POSTURE: rounded shoulders and forward head  PALPATION: Tender in L rectus femoris   LOWER EXTREMITY ROM:  Active ROM Right eval Left eval  Hip flexion     Hip extension    Hip abduction    Hip adduction    Hip internal rotation 20 15  Hip external rotation 60 50   Knee flexion    Knee extension    Ankle dorsiflexion    Ankle plantarflexion    Ankle inversion    Ankle eversion     (Blank rows = not tested)  LOWER EXTREMITY MMT:  MMT Right eval Left eval  Hip flexion 4+ 4+ pain   Hip extension    Hip abduction 4- 3+  Hip adduction    Hip internal rotation    Hip external rotation    Knee flexion 5 5  Knee extension 5 5  Ankle dorsiflexion    Ankle plantarflexion    Ankle inversion    Ankle eversion     (Blank rows = not tested)  LOWER EXTREMITY SPECIAL TESTS:  Hip special tests: Luisa Hart (FABER) test: negative, Thomas test: negative, Hip scouring test: positive , and FADIR pos. , groin pain with hip flexion as well   FUNCTIONAL TESTS:  5 times sit to stand: 9.8 sec   Unable to stand on each LE > 10 sec , incr effort on LLE   2 min walk test 424 feet and no increased pain Difficulty when sit to stand, has to wait a moment , some weakness/soreness here   GAIT: Distance walked: 150 Assistive device utilized: None Level of assistance: Modified independence Comments: no limp, short stride length, mild lateral shifting to each side    TODAY'S TREATMENT:   [facility] Adult PT Treatment:                                                DATE: 11-21-22 Therapeutic Exercise:  Supine clam Green band  Banded Bridge  Adductor squeeze with rolled up towel Supine marching HL ball squeeze  LTR with legs together  but not crossed.   Manual Therapy: STW over gluteals and piriformis.   Myofasical release of Left QL and lumbar paraspinals Trigger Point Dry-Needling performed     by Garen Lah Treatment instructions: Expect mild to moderate muscle soreness. S/S of pneumothorax if dry needled over a lung field, and to seek immediate medical attention should they occur. Patient verbalized understanding of these instructions and  education.  Patient Consent Given: Yes Education handout provided: Previously provided Muscles treated: Left Quadratus Lumborum, L3-4,5  paraspinal,Left gluteals and Piriformis Electrical stimulation performed: No Parameters: N/A  Treatment response/outcome: twitch response noted, pt decrease in pain after TPDN  [facility] Adult PT Treatment:                                                DATE: 11/16/22 Therapeutic Exercise: Nustep L4 UE/LE x 5 minutes  Supine clam Green band  Banded Bridge  Supine marching HL ball squeeze  Bridge with ball squeeze  LTR with legs crossed STS x 10 with cues for control  STS 10 # KB x 10 Tandem on AIREX Narrow on AIREX with head turns  AIREX Marching without UE   [facility] Adult PT Treatment:                                                DATE: 11/14/22 Therapeutic Exercise: NuStep LE and UE and 6 min, L4  Static Balance activities: near wall : SLS, tandem Dynamic balance gait with head turns, nods  Seated LAQ with ball squeeze x 15  Sit to stand x 15 with 10 lbs add chest press for 2nd set  Squat x 10 x 10 lbs, 2 sets  Wall sit 10 lbs  hold isometric 30 sec  Wall squats x 10  Lateral band walking green band 10 feet x 6  Piriformis stretch and pull   [facility] Adult PT Treatment:                                                DATE: 11/11/22 Therapeutic Exercise: MMT see above  2 min walk test  Lower trunk rotation legs crossed Piriformis stretch (push/pull) 3 x 15-20 sec each side  Sidelying hip abduction x 15 Sidelying clam x 15   Standing step up x 15 each to high knee (quad)  Step back x 10 (6 inch) each (glute)  Manual Therapy: Prone soft tissue work to L low lumbar, glute med, piriformis Modalities: 10 min MHP L hip post                                                                                                                      PT EVAL DATE: 10/25/22  PT eval, HEP, POC , self care    PATIENT EDUCATION:  Education details: PT, POC, HEP  hip mobility, back symptoms relation to hip, anatomy  Person educated: Patient Education method: Explanation, Demonstration, and Handouts Education comprehension: verbalized understanding and needs further education  HOME EXERCISE PROGRAM: Access Code: [email or web address] URL: [email or web address] Date: 11/11/2022 Prepared by: Karie Mainland  Exercises - Hip Flexor Stretch at [facility] of Bed  - 1 x daily - 7 x  weekly - 1 sets - 3-5 reps - 30 hold - Supine Hip Internal and External Rotation  - 1 x daily - 7 x weekly - 2 sets - 10 reps - 5 hold - Supine Piriformis Stretch with Foot on Ground  - 1 x daily - 7 x weekly - 1 sets - 5 reps - 30 hold - Clamshell  - 1 x daily - 7 x weekly - 2 sets - 10 reps - 5 hold - Sidelying Hip Abduction  - 1 x daily - 7 x weekly - 2 sets - 10 reps - 5 hold   ASSESSMENT:  CLINICAL IMPRESSION:  Ms Tate enter clinic with complaints that exercises seem to be making her Left hip pain worse and that she feels pain but has trouble pinpointing exact pain locations.  She did have Left pelvic level higher than R and consented to TPDN and was closely monitored. Pt was able to tolerate TPDN and remarked that pain was decreased but PT could not pinpoint exact number on NPS scale.  Pt was able to do some of her HEP with moist hot pack.  Pt asked multiple questions which was answered by PT but all cardiac questions, PT referred to her cardiologist.  Will continue TPDN depending on pt report at future PT appt. Will continue POC and achievement of goals. She will continue to benefit from skilled physical therapy, integrating balance into her POC as able.        OBJECTIVE IMPAIRMENTS: decreased activity tolerance, decreased endurance, decreased knowledge of use of DME, decreased mobility, difficulty walking, decreased ROM, decreased strength, hypomobility, increased fascial restrictions, impaired flexibility, impaired sensation, postural dysfunction, and pain.   ACTIVITY  LIMITATIONS: lifting, bending, sitting, standing, squatting, sleeping, stairs, transfers, bed mobility, and locomotion level  PARTICIPATION LIMITATIONS: cleaning, laundry, interpersonal relationship, and community activity  PERSONAL FACTORS: Age and Time since onset of injury/illness/exacerbation are also affecting patient's functional outcome.   REHAB POTENTIAL: Excellent  CLINICAL DECISION MAKING: Stable/uncomplicated  EVALUATION COMPLEXITY: Low   GOALS:  SHORT TERM GOALS:    1. Pt will be I with initial HEP    Baseline: given on eval    Goal status: ongoing: up to date    2.  Pt will be screened for endurance and balance and goal set   Baseline: NT on eval    Goal status: MET    LONG TERM GOALS: Target date: 12/07/2022    Pt will be I with HEP for hips, core upon discharge  Baseline: unknown  Goal status: INITIAL  2.  Pt will be able to sit for 30 min with no more than min increase in hip pain as she stands up Baseline: 60 min pain mod to severe  Goal status:ongoing   3.  Pt will be able to walk around the block with RPE 6-7 and no increase in hip pain  Baseline: 1/2 block, gets winded easily  Goal status: INITIAL  4.  FOTO score will improve to 67% or better to demo improved functional mobility  Baseline: 58% Goal status: INITIAL  5. Patient will be able to stand in tandem as well as single leg for > 15 sec each without UE support  Baseline: < 10 sec   Goal Status: NEW  6.  Patient will be able to maintain balance while changing directions and turning head to mimic community environment.   Baseline: min difficulty   Goal status NEW    PLAN:  PT FREQUENCY: 2x/week  PT  DURATION: 6-8 weeks  PLANNED INTERVENTIONS: Therapeutic exercises, Therapeutic activity, Neuromuscular re-education, Balance training, Gait training, Patient/Family education, Self Care, Joint mobilization, DME instructions, Dry Needling, Spinal mobilization, Cryotherapy, Moist heat,  Ionotophoresis 4mg /ml Dexamethasone, Manual therapy, and Re-evaluation  PLAN FOR NEXT SESSION: progress HEP.  LE and core strength . Endurance   Garen Lah, PT, [facility] Certified Exercise Expert for the Aging Adult  11/21/22 12:48 PM Phone: [phone] Fax: [phone]

## 2022-11-21 NOTE — Patient Instructions (Signed)

## 2022-11-23 ENCOUNTER — Ambulatory Visit: Payer: Medicare HMO | Admitting: Physical Therapy

## 2022-11-23 ENCOUNTER — Encounter: Payer: Self-pay | Admitting: Cardiology

## 2022-11-23 ENCOUNTER — Encounter: Payer: Self-pay | Admitting: Physical Therapy

## 2022-11-23 DIAGNOSIS — M25652 Stiffness of left hip, not elsewhere classified: Secondary | ICD-10-CM | POA: Diagnosis not present

## 2022-11-23 DIAGNOSIS — M25552 Pain in left hip: Secondary | ICD-10-CM

## 2022-11-23 NOTE — Therapy (Signed)
OUTPATIENT PHYSICAL THERAPY TREATMENT   Patient Name: Tammy [name] MRN: [id number] DOB:Jun 29, 1940, 82 y.o., female Today's Date: 11/23/2022  END OF SESSION:  PT End of Session - 11/23/22 1051     Visit Number 6    Number of Visits 12    Date for PT Re-Evaluation 12/06/22    Authorization Type [employer]    Authorization Time Period 7/10-8/10    Authorization - Visit Number 6    Authorization - Number of Visits 12    PT Start Time 1100    PT Stop Time 1145    PT Time Calculation (min) 45 min               Past Medical History:  Diagnosis Date   Hypothyroidism    Skin lesion of neck    Anterior Neck (Thyroid Tissue)   Thyroid disease    Past Surgical History:  Procedure Laterality Date   A-FLUTTER ABLATION N/A 07/11/2022   Procedure: A-FLUTTER ABLATION;  Surgeon: Lanier Prude, MD;  Location: [facility];  Service: Cardiovascular;  Laterality: N/A;   ANAL FISSURE REPAIR     broken elbow     pins   ELBOW SURGERY Right    EYE SURGERY     bilateral cataracts    FOOT SURGERY Left    LESION REMOVAL N/A 10/11/2017   Procedure: EXCISION ANTERIOR NECK SKIN LESION ERAS PATHWAY;  Surgeon: Abigail Miyamoto, MD;  Location: [facility] OR;  Service: General;  Laterality: N/A;   needle removed from foot     THYROID SURGERY     TONSILLECTOMY     TUBAL LIGATION     Patient Active Problem List   Diagnosis Date Noted   Concussion 03/31/2020    PCP: Creola Corn MD   REFERRING PROVIDER: Richardean Sale, DO  REFERRING DIAG: [phone] (ICD-10-CM) - Left hip pain  THERAPY DIAG:  Pain in left hip  Stiffness of left hip, not elsewhere classified  Rationale for Evaluation and Treatment: Rehabilitation  ONSET DATE: chronic   SUBJECTIVE:   SUBJECTIVE STATEMENT: I am hurting today and I feel like I am getting worse.  I have done the first 3 exercises.  I don't know if exercise is what I need.  I feel like it is really nerve pain. I am not sure that I can do anything that  makes it better.  In the middle of all of this exercise and PT I got my the cardiac loop implanted Thursday July 18th after my ablation in March of 2024. I forgot to tell you all. Sometimes I feel like my legs feel like lead due to my blood thinner.  (PT directed pt back to cardiologist)  PERTINENT HISTORY: Ablation for A fib. , HOH   PAIN:  Are you having pain? Yes: NPRS scale: 5/10 Pain location: achy Pain description: L hip and prox thigh  Aggravating factors: walking, activity, needs time to get ready for the day  Relieving factors: sitting,    PRECAUTIONS: None A-fib  WEIGHT BEARING RESTRICTIONS: No  FALLS:  Has patient fallen in last 6 months? No has had a fall in the past with a concussion and thinks maybe her L hip gave out and caused her to trip and fall   LIVING ENVIRONMENT: Lives with: lives alone, widowed Lives in: House/apartment Stairs: Yes: Internal: 12 steps; on left going up has to pull herself  Has following equipment at home: Ramped entry   OCCUPATION: was a Catering manager, retired   PLOF:  Independent  PATIENT GOALS: I want to get rid of the pain   NEXT MD VISIT: as needed   OBJECTIVE:   DIAGNOSTIC FINDINGS: There is no evidence of hip fracture or dislocation. Joint space narrowing with sclerosis and osteophytes left hip. Lumbosacral degenerative changes.   IMPRESSION: Degenerative changes.  No acute osseous abnormalities.   PATIENT SURVEYS:  FOTO 58%   11/23/22: FOTO 58%  COGNITION: Overall cognitive status: Within functional limits for tasks assessed     SENSATION: WFL Does report numbness in tingling in Rt thigh   EDEMA:  NT  MUSCLE LENGTH: Hamstrings: WNL  Hip flexors, quads   POSTURE: rounded shoulders and forward head  PALPATION: Tender in L rectus femoris   LOWER EXTREMITY ROM:  Active ROM Right eval Left eval  Hip flexion    Hip extension    Hip abduction    Hip adduction    Hip internal rotation 20 15  Hip external  rotation 60 50   Knee flexion    Knee extension    Ankle dorsiflexion    Ankle plantarflexion    Ankle inversion    Ankle eversion     (Blank rows = not tested)  LOWER EXTREMITY MMT:  MMT Right eval Left eval  Hip flexion 4+ 4+ pain   Hip extension    Hip abduction 4- 3+  Hip adduction    Hip internal rotation    Hip external rotation    Knee flexion 5 5  Knee extension 5 5  Ankle dorsiflexion    Ankle plantarflexion    Ankle inversion    Ankle eversion     (Blank rows = not tested)  LOWER EXTREMITY SPECIAL TESTS:  Hip special tests: Luisa Hart (FABER) test: negative, Thomas test: negative, Hip scouring test: positive , and FADIR pos. , groin pain with hip flexion as well   FUNCTIONAL TESTS:  5 times sit to stand: 9.8 sec   Unable to stand on each LE > 10 sec , incr effort on LLE   2 min walk test 424 feet and no increased pain Difficulty when sit to stand, has to wait a moment , some weakness/soreness here   GAIT: Distance walked: 150 Assistive device utilized: None Level of assistance: Modified independence Comments: no limp, short stride length, mild lateral shifting to each side    TODAY'S TREATMENT:   [facility] Adult PT Treatment:                                                DATE: 11/23/22 Therapeutic Exercise: Nustep L3 UE/LE x 5 min Butterfly stretch 10 sec x 3 Blue band clam , hook lying 10 x 2 Blue Band march 10 x 2  Ball squeeze 3 sec x 10  Bridge 5 sec 10 x 2  Updated HEP  Therapeutic Activity: FOTO 58 (no change)   [facility] Adult PT Treatment:                                                DATE: 11-21-22 Therapeutic Exercise:  Supine clam Green band  Banded Bridge  Adductor squeeze with rolled up towel Supine marching HL ball squeeze   LTR with legs together  but not crossed.  Manual Therapy: STW over gluteals and piriformis.   Myofasical release of Left QL and lumbar paraspinals Trigger Point Dry-Needling performed     by Garen Lah Treatment instructions: Expect mild to moderate muscle soreness. S/S of pneumothorax if dry needled over a lung field, and to seek immediate medical attention should they occur. Patient verbalized understanding of these instructions and education.  Patient Consent Given: Yes Education handout provided: Previously provided Muscles treated: Left Quadratus Lumborum, L3-4,5  paraspinal,Left gluteals and Piriformis Electrical stimulation performed: No Parameters: N/A Treatment response/outcome: twitch response noted, pt decrease in pain after TPDN  [facility] Adult PT Treatment:                                                DATE: 11/16/22 Therapeutic Exercise: Nustep L4 UE/LE x 5 minutes  Supine clam Green band  Banded Bridge  Supine marching HL ball squeeze  Bridge with ball squeeze  LTR with legs crossed STS x 10 with cues for control  STS 10 # KB x 10 Tandem on AIREX Narrow on AIREX with head turns  AIREX Marching without UE   [facility] Adult PT Treatment:                                                DATE: 11/14/22 Therapeutic Exercise: NuStep LE and UE and 6 min, L4  Static Balance activities: near wall : SLS, tandem Dynamic balance gait with head turns, nods  Seated LAQ with ball squeeze x 15  Sit to stand x 15 with 10 lbs add chest press for 2nd set  Squat x 10 x 10 lbs, 2 sets  Wall sit 10 lbs  hold isometric 30 sec  Wall squats x 10  Lateral band walking green band 10 feet x 6  Piriformis stretch and pull   [facility] Adult PT Treatment:                                                DATE: 11/11/22 Therapeutic Exercise: MMT see above  2 min walk test  Lower trunk rotation legs crossed Piriformis stretch (push/pull) 3 x 15-20 sec each side  Sidelying hip abduction x 15 Sidelying clam x 15   Standing step up x 15 each to high knee (quad)  Step back x 10 (6 inch) each (glute)  Manual Therapy: Prone soft tissue work to L low lumbar, glute med, piriformis Modalities: 10 min  MHP L hip post  PT EVAL DATE: 10/25/22  PT eval, HEP, POC , self care    PATIENT EDUCATION:  Education details: PT, POC, HEP hip mobility, back symptoms relation to hip, anatomy  Person educated: Patient Education method: Explanation, Demonstration, and Handouts Education comprehension: verbalized understanding and needs further education  HOME EXERCISE PROGRAM: Access Code: [email or web address] URL: [email or web address] Date: 11/11/2022 Prepared by: Karie Mainland  Exercises - Hip Flexor Stretch at [facility] of Bed  - 1 x daily - 7 x weekly - 1 sets - 3-5 reps - 30 hold - Supine Hip Internal and External Rotation  - 1 x daily - 7 x weekly - 2 sets - 10 reps - 5 hold - Supine Piriformis Stretch with Foot on Ground  - 1 x daily - 7 x weekly - 1 sets - 5 reps - 30 hold - (does not complete due to lateral hip pain) Clamshell  - 1 x daily - 7 x weekly - 2 sets - 10 reps - 5 hold - (does not complete due to lateral hip pain)Sidelying Hip Abduction  - 1 x daily - 7 x weekly - 2 sets - 10 reps - 5 hold   ASSESSMENT:  CLINICAL IMPRESSION:  Ms Entsminger enters clinic with reports that the TPDN might have helped a little. She is willing try it again as recommended by the performing PT. She does report that overall, she is worse than she was when she started PT. FOTO score unchanged. Reminded patient of interventions that were offered my MD at last appt  (ie cortisone vs prednisone). Pt is concerned about the interaction these treatments might have with her Afib. She is willing to continue PT and TPDN for remainder of POC.  Will continue TPDN depending on pt report at future PT appt. Will continue POC and achievement of goals. She will continue to benefit from skilled physical therapy, integrating balance into her POC as able.        OBJECTIVE IMPAIRMENTS: decreased activity  tolerance, decreased endurance, decreased knowledge of use of DME, decreased mobility, difficulty walking, decreased ROM, decreased strength, hypomobility, increased fascial restrictions, impaired flexibility, impaired sensation, postural dysfunction, and pain.   ACTIVITY LIMITATIONS: lifting, bending, sitting, standing, squatting, sleeping, stairs, transfers, bed mobility, and locomotion level  PARTICIPATION LIMITATIONS: cleaning, laundry, interpersonal relationship, and community activity  PERSONAL FACTORS: Age and Time since onset of injury/illness/exacerbation are also affecting patient's functional outcome.   REHAB POTENTIAL: Excellent  CLINICAL DECISION MAKING: Stable/uncomplicated  EVALUATION COMPLEXITY: Low   GOALS:  SHORT TERM GOALS:    1. Pt will be I with initial HEP    Baseline: given on eval    Goal status: ongoing: up to date    2.  Pt will be screened for endurance and balance and goal set   Baseline: NT on eval    Goal status: MET    LONG TERM GOALS: Target date: 12/07/2022    Pt will be I with HEP for hips, core upon discharge  Baseline: unknown  Goal status: INITIAL  2.  Pt will be able to sit for 30 min with no more than min increase in hip pain as she stands up Baseline: 60 min pain mod to severe  Goal status:ongoing   3.  Pt will be able to walk around the block with RPE 6-7 and no increase in hip pain  Baseline: 1/2 block, gets winded easily  Goal status: INITIAL  4.  FOTO score will improve to 67% or better to demo improved  functional mobility  Baseline: 58% 11/23/22: 58% Goal status: ONGOING  5. Patient will be able to stand in tandem as well as single leg for > 15 sec each without UE support  Baseline: < 10 sec   Goal Status: NEW  6.  Patient will be able to maintain balance while changing directions and turning head to mimic community environment.   Baseline: min difficulty   Goal status NEW    PLAN:  PT FREQUENCY: 2x/week  PT  DURATION: 6-8 weeks  PLANNED INTERVENTIONS: Therapeutic exercises, Therapeutic activity, Neuromuscular re-education, Balance training, Gait training, Patient/Family education, Self Care, Joint mobilization, DME instructions, Dry Needling, Spinal mobilization, Cryotherapy, Moist heat, Ionotophoresis 4mg /ml Dexamethasone, Manual therapy, and Re-evaluation  PLAN FOR NEXT SESSION: progress HEP.  LE and core strength . Endurance   Jannette Spanner, [place] 11/23/22 12:46 PM Phone: [phone] Fax: [phone]

## 2022-11-29 ENCOUNTER — Encounter: Payer: Self-pay | Admitting: Cardiology

## 2022-11-29 ENCOUNTER — Ambulatory Visit: Payer: Medicare HMO | Admitting: Physical Therapy

## 2022-11-29 ENCOUNTER — Encounter: Payer: Self-pay | Admitting: Physical Therapy

## 2022-11-29 DIAGNOSIS — M25552 Pain in left hip: Secondary | ICD-10-CM

## 2022-11-29 DIAGNOSIS — M25652 Stiffness of left hip, not elsewhere classified: Secondary | ICD-10-CM

## 2022-11-29 NOTE — Therapy (Signed)
OUTPATIENT PHYSICAL THERAPY TREATMENT/DISCHARGE NOTE PHYSICAL THERAPY DISCHARGE SUMMARY  Visits from Start of Care: 8  Current functional level related to goals / functional outcomes: As indicated below   Remaining deficits: Maximum strength limited by pain   Education / Equipment: HEP   Patient agrees to discharge. Patient goals were partially met. Patient is being discharged due to  Pt request as she is going to return to [place] for steroid injection and pursue aquatics in community and silver sneakers for ongoing exercise.    Patient Name: Tammy [name]: [id number] DOB:1940/07/04, 82 y.o., female Today's Date: 12/01/2022  END OF SESSION:  PT End of Session - 12/01/22 1153     Visit Number 8    Number of Visits 12    Date for PT Re-Evaluation 12/06/22    Authorization Type [employer]    Authorization Time Period 7/10-8/10    Authorization - Visit Number 8    Authorization - Number of Visits 12    PT Start Time 1150    PT Stop Time 1230    PT Time Calculation (min) 40 min                 Past Medical History:  Diagnosis Date   Hypothyroidism    Skin lesion of neck    Anterior Neck (Thyroid Tissue)   Thyroid disease    Past Surgical History:  Procedure Laterality Date   A-FLUTTER ABLATION N/A 07/11/2022   Procedure: A-FLUTTER ABLATION;  Surgeon: Lanier Prude, MD;  Location: [facility];  Service: Cardiovascular;  Laterality: N/A;   ANAL FISSURE REPAIR     broken elbow     pins   ELBOW SURGERY Right    EYE SURGERY     bilateral cataracts    FOOT SURGERY Left    LESION REMOVAL N/A 10/11/2017   Procedure: EXCISION ANTERIOR NECK SKIN LESION ERAS PATHWAY;  Surgeon: Abigail Miyamoto, MD;  Location: [facility] OR;  Service: General;  Laterality: N/A;   needle removed from foot     THYROID SURGERY     TONSILLECTOMY     TUBAL LIGATION     Patient Active Problem List   Diagnosis Date Noted   Concussion 03/31/2020    PCP: Creola Corn MD   REFERRING  PROVIDER: Richardean Sale, DO  REFERRING DIAG: [phone] (ICD-10-CM) - Left hip pain  THERAPY DIAG:  Pain in left hip  Stiffness of left hip, not elsewhere classified  Rationale for Evaluation and Treatment: Rehabilitation  ONSET DATE: chronic   SUBJECTIVE:   SUBJECTIVE STATEMENT: I think the exercise is making me worse. I may return to my MD and get an injection. I can go to [employer].  I still have pain ( no pain number specified) It is hard for me to walk due to me getting out of breath.      PERTINENT HISTORY: Ablation for A fib. , HOH   PAIN:  Are you having pain? Yes: NPRS scale: 5/10 Pain location: achy Pain description: L hip and prox thigh  Aggravating factors: walking, activity, needs time to get ready for the day  Relieving factors: sitting,    PRECAUTIONS: None A-fib  WEIGHT BEARING RESTRICTIONS: No  FALLS:  Has patient fallen in last 6 months? No has had a fall in the past with a concussion and thinks maybe her L hip gave out and caused her to trip and fall   LIVING ENVIRONMENT: Lives with: lives alone, widowed Lives in: House/apartment  Stairs: Yes: Internal: 12 steps; on left going up has to pull herself  Has following equipment at home: Ramped entry   OCCUPATION: was a bookkeeper, retired   PLOF: Independent  PATIENT GOALS: I want to get rid of the pain   NEXT MD VISIT: as needed   OBJECTIVE:   DIAGNOSTIC FINDINGS: There is no evidence of hip fracture or dislocation. Joint space narrowing with sclerosis and osteophytes left hip. Lumbosacral degenerative changes.   IMPRESSION: Degenerative changes.  No acute osseous abnormalities.   PATIENT SURVEYS:  FOTO 58%   11/23/22: FOTO 58% 12-01-22 63%  COGNITION: Overall cognitive status: Within functional limits for tasks assessed     SENSATION: WFL Does report numbness in tingling in Rt thigh   EDEMA:  NT  MUSCLE LENGTH: Hamstrings: WNL  Hip flexors, quads   POSTURE:  rounded shoulders and forward head  PALPATION: Tender in L rectus femoris   LOWER EXTREMITY ROM:  Active ROM Right eval Left eval R/L 12-01-22  Hip flexion     Hip extension     Hip abduction     Hip adduction     Hip internal rotation 20 15 22/18p  Hip external rotation 60 50  60/55  Knee flexion     Knee extension     Ankle dorsiflexion     Ankle plantarflexion     Ankle inversion     Ankle eversion      (Blank rows = not tested)  LOWER EXTREMITY MMT:  MMT Right eval Left eval R/L 12-01-22  Hip flexion 4+ 4+ pain  5/4+ minimal pain  Hip extension     Hip abduction 4- 3+ 4+/4  Hip adduction     Hip internal rotation     Hip external rotation     Knee flexion 5 5 5/5  Knee extension 5 5 5/5  Ankle dorsiflexion     Ankle plantarflexion     Ankle inversion     Ankle eversion      (Blank rows = not tested)  LOWER EXTREMITY SPECIAL TESTS:  Hip special tests: Luisa Hart (FABER) test: negative, Thomas test: negative, Hip scouring test: positive , and FADIR pos. , groin pain with hip flexion as well   FUNCTIONAL TESTS:  5 times sit to stand: 9.8 sec   Unable to stand on each LE > 10 sec , incr effort on LLE   2 min walk test 424 feet and no increased pain Difficulty when sit to stand, has to wait a moment , some weakness/soreness here   GAIT: Distance walked: 150 Assistive device utilized: None Level of assistance: Modified independence Comments: no limp, short stride length, mild lateral shifting to each side    TODAY'S TREATMENT:   [facility] Adult PT Treatment:                                                DATE: 12-01-22 Therapeutic Exercise: Reviewed HEP and went over with personal notes for correct execution Hip Flexor Stretch at Edge of Bed  3-5 reps - 30 hold Supine Hip Internal and External Rotation  1 set - 10 reps - 5 hold Supine Piriformis Stretch with Foot on Ground  5 reps - 30 hold Clamshell  1 set - 10 reps - 5 hold Clamshell modified 2 x 10 reps Side  lying Hip Abduction  1  set - 10 reps - 5 hold Supine Bridge  2 x 10 VC for max range Hooklying Clamshell with GTB (can hold for 15 sec if painful to load tissues at various angles if irritated - 2 x daily - 7 x weekly - 3 sets - 10 reps - 5 hold Supine March with GTB - 1 x daily - 7 x weekly - 3 sets - 10 reps Single Leg Bridge  1 sets - 10 reps Standing Hip Extension with Counter Support  -  1 x 10 BIL Standing Hip Abduction with Counter Support  -  1 x 10 BIL Manual Therapy: STW over gluteals and piriformis.   Myofasical release of Left QL and lumbar paraspinals LAD of L LE Trigger Point Dry-Needling performed     by Garen Lah Treatment instructions: Expect mild to moderate muscle soreness. S/S of pneumothorax if dry needled over a lung field, and to seek immediate medical attention should they occur. Patient verbalized understanding of these instructions and education.  Patient Consent Given: Yes Education handout provided: Previously provided Muscles treated:  Left Adductor Magnus,Left Quadratus Lumborum, L3-4,5  paraspinal,Left gluteals and Piriformis Electrical stimulation performed: No Parameters: N/A Treatment response/outcome: twitch response noted, pt decrease in pain after TPDN  [facility] Adult PT Treatment:                                                DATE: 11-29-22 Therapeutic Exercise: SLR 2 x 10 on L LE after TPDN  No pain in groin GTB supine clam 3 x 10 SL bridge on Left 2 x 10 Standing hip extension 2 x 10 Standing hip abduction 2 x 10 Manual Therapy: STW over gluteals and piriformis.   Myofasical release of Left QL and lumbar paraspinals LAD of L LE Trigger Point Dry-Needling performed     by Garen Lah Treatment instructions: Expect mild to moderate muscle soreness. S/S of pneumothorax if dry needled over a lung field, and to seek immediate medical attention should they occur. Patient verbalized understanding of these instructions and education.  Patient  Consent Given: Yes Education handout provided: Previously provided Muscles treated:  Left Adductor Magnus,Left Quadratus Lumborum, L3-4,5  paraspinal,Left gluteals and Piriformis Electrical stimulation performed: No Parameters: N/A Treatment response/outcome: twitch response noted, pt decrease in pain after TPDN  Self Care: PNE inital education  [facility] Adult PT Treatment:                                                DATE: 11/23/22 Therapeutic Exercise: Nustep L3 UE/LE x 5 min Butterfly stretch 10 sec x 3 Blue band clam , hook lying 10 x 2 Blue Band march 10 x 2  Ball squeeze 3 sec x 10  Bridge 5 sec 10 x 2  Updated HEP  Therapeutic Activity: FOTO 58 (no change)   [facility] Adult PT Treatment:                                                DATE: 11-21-22 Therapeutic Exercise:  Supine clam Green band  Banded Bridge  Adductor squeeze with  rolled up towel Supine marching HL ball squeeze   LTR with legs together  but not crossed.   Manual Therapy: STW over gluteals and piriformis.   Myofasical release of Left QL and lumbar paraspinals Trigger Point Dry-Needling performed     by Garen Lah Treatment instructions: Expect mild to moderate muscle soreness. S/S of pneumothorax if dry needled over a lung field, and to seek immediate medical attention should they occur. Patient verbalized understanding of these instructions and education.  Patient Consent Given: Yes Education handout provided: Previously provided Muscles treated: Left Quadratus Lumborum, L3-4,5  paraspinal,Left gluteals and Piriformis Electrical stimulation performed: No Parameters: N/A Treatment response/outcome: twitch response noted, pt decrease in pain after TPDN  [facility] Adult PT Treatment:                                                DATE: 11/16/22 Therapeutic Exercise: Nustep L4 UE/LE x 5 minutes  Supine clam Green band  Banded Bridge  Supine marching HL ball squeeze  Bridge with ball squeeze  LTR with  legs crossed STS x 10 with cues for control  STS 10 # KB x 10 Tandem on AIREX Narrow on AIREX with head turns  AIREX Marching without UE   [facility] Adult PT Treatment:                                                DATE: 11/14/22 Therapeutic Exercise: NuStep LE and UE and 6 min, L4  Static Balance activities: near wall : SLS, tandem Dynamic balance gait with head turns, nods  Seated LAQ with ball squeeze x 15  Sit to stand x 15 with 10 lbs add chest press for 2nd set  Squat x 10 x 10 lbs, 2 sets  Wall sit 10 lbs  hold isometric 30 sec  Wall squats x 10  Lateral band walking green band 10 feet x 6  Piriformis stretch and pull   [facility] Adult PT Treatment:                                                DATE: 11/11/22 Therapeutic Exercise: MMT see above  2 min walk test  Lower trunk rotation legs crossed Piriformis stretch (push/pull) 3 x 15-20 sec each side  Sidelying hip abduction x 15 Sidelying clam x 15   Standing step up x 15 each to high knee (quad)  Step back x 10 (6 inch) each (glute)  Manual Therapy: Prone soft tissue work to L low lumbar, glute med, piriformis Modalities: 10 min MHP L hip post  PT EVAL DATE: 10/25/22  PT eval, HEP, POC , self care    PATIENT EDUCATION:  Education details: PT, POC, HEP hip mobility, back symptoms relation to hip, anatomy  Person educated: Patient Education method: Explanation, Demonstration, and Handouts Education comprehension: verbalized understanding and needs further education  HOME EXERCISE PROGRAM: Access Code: [email or web address] Updated with specific times URL: [email or web address] Date: 12/01/2022 Prepared by: Garen Lah  Exercises - Hip Flexor Stretch at [facility] of Bed  - 1 x daily - 7 x weekly - 1 sets - 3-5 reps - 30 hold - Supine Hip Internal and External Rotation  - 1 x daily - 7 x weekly - 2 sets  - 10 reps - 5 hold - Supine Piriformis Stretch with Foot on Ground  - 1 x daily - 7 x weekly - 1 sets - 5 reps - 30 hold - Clamshell  - 1 x daily - 7 x weekly - 2 sets - 10 reps - 5 hold - Sidelying Hip Abduction  - 1 x daily - 7 x weekly - 2 sets - 10 reps - 5 hold - Supine Bridge  - 1 x daily - 7 x weekly - 2 sets - 10 reps - 5 hold - Hooklying Clamshell with Resistance (can hold for 15 sec if painful to load tissues at various angles if irritated - 2 x daily - 7 x weekly - 3 sets - 10 reps - 5 hold - Supine March with Resistance Band  - 1 x daily - 7 x weekly - 3 sets - 10 reps - Single Leg Bridge  - 1 x daily - 7 x weekly - 3 sets - 10 reps - Standing Hip Extension with Counter Support  - 1 x daily - 7 x weekly - 3 sets - 10 reps - Standing Hip Abduction with Counter Support  - 1 x daily - 7 x weekly - 3 sets - 10 reps  ASSESSMENT:  CLINICAL IMPRESSION:  Ms Miedema enters clinic with reports that the TPDN did help somewhat  and she consented to [facility] and was closely monitored throughout session.  Pt reports no pain after TPDN and exercise but she states pain does return when she stands and she is at home. FOTO improved to 63% but not goal of 67. Pt states she will return to [place] for steroid shot. Today is her last visit and she is independent with HEP.  She does report that overall, she is worse than she was when she started and wonders why her began in the first place although she is leaving today in decreased pain overall. Pt is now ready for cortisone injection. Pt will be able to continue with community wellness post DC. Pt was a joy for whom to serve.           OBJECTIVE IMPAIRMENTS: decreased activity tolerance, decreased endurance, decreased knowledge of use of DME, decreased mobility, difficulty walking, decreased ROM, decreased strength, hypomobility, increased fascial restrictions, impaired flexibility, impaired sensation, postural dysfunction, and pain.   ACTIVITY LIMITATIONS:  lifting, bending, sitting, standing, squatting, sleeping, stairs, transfers, bed mobility, and locomotion level  PARTICIPATION LIMITATIONS: cleaning, laundry, interpersonal relationship, and community activity  PERSONAL FACTORS: Age and Time since onset of injury/illness/exacerbation are also affecting patient's functional outcome.   REHAB POTENTIAL: Excellent  CLINICAL DECISION MAKING: Stable/uncomplicated  EVALUATION COMPLEXITY: Low   GOALS:  SHORT TERM GOALS:    1. Pt will be I with initial HEP    Baseline: given on eval  Goal status: ongoing: up to date    2.  Pt will be screened for endurance and balance and goal set   Baseline: NT on eval    Goal status: MET    LONG TERM GOALS: Target date: 12/07/2022    Pt will be I with HEP for hips, core upon discharge  Baseline: unknown  12-01-22 I with exercises Goal status: MET  2.  Pt will be able to sit for 30 min with no more than min increase in hip pain as she stands up Baseline: 60 min pain mod to severe  12-01-22 can sit for 30 min but moderate pain Goal status:Partially met  3.  Pt will be able to walk around the block with RPE 6-7 and no increase in hip pain  Baseline: 1/2 block, gets winded easily  12-01-22  Pt with hip pain and wants to return to MD for steroid injection Goal status: Not met  4.  FOTO score will improve to 67% or better to demo improved functional mobility  Baseline: 58% 11/23/22: 58% 12-01-22 63% Goal status: Partially met  5. Patient will be able to stand in tandem as well as single leg for > 15 sec each without UE support  Baseline: < 10 sec   Goal Status: NT pt returning to MD for further evaluation of hip and possible hip injection  6.  Patient will be able to maintain balance while changing directions and turning head to mimic community environment.   Baseline: min difficulty   Goal status NT pt returning to MD for further evaluation of hip and possible hip injection    PLAN:  PT  FREQUENCY: 2x/week  PT DURATION: 6-8 weeks  PLANNED INTERVENTIONS: Therapeutic exercises, Therapeutic activity, Neuromuscular re-education, Balance training, Gait training, Patient/Family education, Self Care, Joint mobilization, DME instructions, Dry Needling, Spinal mobilization, Cryotherapy, Moist heat, Ionotophoresis 4mg /ml Dexamethasone, Manual therapy, and Re-evaluation  PLAN FOR NEXT SESSION: progress HEP.  LE and core strength . Endurance   Garen Lah, PT, [facility] Certified Exercise Expert for the Aging Adult  12/01/22 1:07 PM Phone: [phone] Fax: [phone]

## 2022-11-29 NOTE — Therapy (Signed)
OUTPATIENT PHYSICAL THERAPY TREATMENT   Patient Name: Tammy [name] MRN: [id number] DOB:[birth year], 82 y.o., female Today's Date: 11/29/2022  END OF SESSION:  PT End of Session - 11/29/22 1153     Visit Number 7    Number of Visits 12    Date for PT Re-Evaluation 12/06/22    Authorization Type [employer]    Authorization Time Period 7/10-8/10    Authorization - Visit Number 7    Authorization - Number of Visits 12    PT Start Time 1150    PT Stop Time 1230    PT Time Calculation (min) 40 min                Past Medical History:  Diagnosis Date   Hypothyroidism    Skin lesion of neck    Anterior Neck (Thyroid Tissue)   Thyroid disease    Past Surgical History:  Procedure Laterality Date   A-FLUTTER ABLATION N/A 07/11/2022   Procedure: A-FLUTTER ABLATION;  Surgeon: Lanier Prude, MD;  Location: [facility];  Service: Cardiovascular;  Laterality: N/A;   ANAL FISSURE REPAIR     broken elbow     pins   ELBOW SURGERY Right    EYE SURGERY     bilateral cataracts    FOOT SURGERY Left    LESION REMOVAL N/A 10/11/2017   Procedure: EXCISION ANTERIOR NECK SKIN LESION ERAS PATHWAY;  Surgeon: Abigail Miyamoto, MD;  Location: [facility] OR;  Service: General;  Laterality: N/A;   needle removed from foot     THYROID SURGERY     TONSILLECTOMY     TUBAL LIGATION     Patient Active Problem List   Diagnosis Date Noted   Concussion 03/31/2020    PCP: Creola Corn MD   REFERRING PROVIDER: Richardean Sale, DO  REFERRING DIAG: [phone] (ICD-10-CM) - Left hip pain  THERAPY DIAG:  Pain in left hip  Stiffness of left hip, not elsewhere classified  Rationale for Evaluation and Treatment: Rehabilitation  ONSET DATE: chronic   SUBJECTIVE:   SUBJECTIVE STATEMENT: I think the exercise is making me worse. Especially doing some exercises ( hip add/abd)  I am afraid of getting a cortisone shot. I don't know how it affects my ablation.  (PT directed pt to her cardiologist.)   I have pain in my Left groin and my outer hip. I can never explain the pain. It is mostly after I get up and I am walking but once I get walking I do better but then I get out of breath.    PERTINENT HISTORY: Ablation for A fib. , HOH   PAIN:  Are you having pain? Yes: NPRS scale: 5/10 Pain location: achy Pain description: L hip and prox thigh  Aggravating factors: walking, activity, needs time to get ready for the day  Relieving factors: sitting,    PRECAUTIONS: None A-fib  WEIGHT BEARING RESTRICTIONS: No  FALLS:  Has patient fallen in last 6 months? No has had a fall in the past with a concussion and thinks maybe her L hip gave out and caused her to trip and fall   LIVING ENVIRONMENT: Lives with: lives alone, widowed Lives in: House/apartment Stairs: Yes: Internal: 12 steps; on left going up has to pull herself  Has following equipment at home: Ramped entry   OCCUPATION: was a Catering manager, retired   PLOF: Independent  PATIENT GOALS: I want to get rid of the pain   NEXT MD VISIT: as needed  OBJECTIVE:   DIAGNOSTIC FINDINGS: There is no evidence of hip fracture or dislocation. Joint space narrowing with sclerosis and osteophytes left hip. Lumbosacral degenerative changes.   IMPRESSION: Degenerative changes.  No acute osseous abnormalities.   PATIENT SURVEYS:  FOTO 58%   11/23/22: FOTO 58%  COGNITION: Overall cognitive status: Within functional limits for tasks assessed     SENSATION: WFL Does report numbness in tingling in Rt thigh   EDEMA:  NT  MUSCLE LENGTH: Hamstrings: WNL  Hip flexors, quads   POSTURE: rounded shoulders and forward head  PALPATION: Tender in L rectus femoris   LOWER EXTREMITY ROM:  Active ROM Right eval Left eval  Hip flexion    Hip extension    Hip abduction    Hip adduction    Hip internal rotation 20 15  Hip external rotation 60 50   Knee flexion    Knee extension    Ankle dorsiflexion    Ankle plantarflexion     Ankle inversion    Ankle eversion     (Blank rows = not tested)  LOWER EXTREMITY MMT:  MMT Right eval Left eval  Hip flexion 4+ 4+ pain   Hip extension    Hip abduction 4- 3+  Hip adduction    Hip internal rotation    Hip external rotation    Knee flexion 5 5  Knee extension 5 5  Ankle dorsiflexion    Ankle plantarflexion    Ankle inversion    Ankle eversion     (Blank rows = not tested)  LOWER EXTREMITY SPECIAL TESTS:  Hip special tests: Luisa Hart (FABER) test: negative, Thomas test: negative, Hip scouring test: positive , and FADIR pos. , groin pain with hip flexion as well   FUNCTIONAL TESTS:  5 times sit to stand: 9.8 sec   Unable to stand on each LE > 10 sec , incr effort on LLE   2 min walk test 424 feet and no increased pain Difficulty when sit to stand, has to wait a moment , some weakness/soreness here   GAIT: Distance walked: 150 Assistive device utilized: None Level of assistance: Modified independence Comments: no limp, short stride length, mild lateral shifting to each side    TODAY'S TREATMENT:   [facility] Adult PT Treatment:                                                DATE: 11-29-22 Therapeutic Exercise: SLR 2 x 10 on L LE after TPDN  No pain in groin GTB supine clam 3 x 10 SL bridge on Left 2 x 10 Standing hip extension 2 x 10 Standing hip abduction 2 x 10 Manual Therapy: STW over gluteals and piriformis.   Myofasical release of Left QL and lumbar paraspinals LAD of L LE Trigger Point Dry-Needling performed     by Garen Lah Treatment instructions: Expect mild to moderate muscle soreness. S/S of pneumothorax if dry needled over a lung field, and to seek immediate medical attention should they occur. Patient verbalized understanding of these instructions and education.  Patient Consent Given: Yes Education handout provided: Previously provided Muscles treated:  Left Adductor Magnus,Left Quadratus Lumborum, L3-4,5  paraspinal,Left gluteals and  Piriformis Electrical stimulation performed: No Parameters: N/A Treatment response/outcome: twitch response noted, pt decrease in pain after TPDN  Self Care: PNE inital education  [facility] Adult PT Treatment:  DATE: 11/23/22 Therapeutic Exercise: Nustep L3 UE/LE x 5 min Butterfly stretch 10 sec x 3 Blue band clam , hook lying 10 x 2 Blue Band march 10 x 2  Ball squeeze 3 sec x 10  Bridge 5 sec 10 x 2  Updated HEP  Therapeutic Activity: FOTO 58 (no change)   [facility] Adult PT Treatment:                                                DATE: 11-21-22 Therapeutic Exercise:  Supine clam Green band  Banded Bridge  Adductor squeeze with rolled up towel Supine marching HL ball squeeze   LTR with legs together  but not crossed.   Manual Therapy: STW over gluteals and piriformis.   Myofasical release of Left QL and lumbar paraspinals Trigger Point Dry-Needling performed     by Garen Lah Treatment instructions: Expect mild to moderate muscle soreness. S/S of pneumothorax if dry needled over a lung field, and to seek immediate medical attention should they occur. Patient verbalized understanding of these instructions and education.  Patient Consent Given: Yes Education handout provided: Previously provided Muscles treated: Left Quadratus Lumborum, L3-4,5  paraspinal,Left gluteals and Piriformis Electrical stimulation performed: No Parameters: N/A Treatment response/outcome: twitch response noted, pt decrease in pain after TPDN  [facility] Adult PT Treatment:                                                DATE: 11/16/22 Therapeutic Exercise: Nustep L4 UE/LE x 5 minutes  Supine clam Green band  Banded Bridge  Supine marching HL ball squeeze  Bridge with ball squeeze  LTR with legs crossed STS x 10 with cues for control  STS 10 # KB x 10 Tandem on AIREX Narrow on AIREX with head turns  AIREX Marching without UE   [facility] Adult PT  Treatment:                                                DATE: 11/14/22 Therapeutic Exercise: NuStep LE and UE and 6 min, L4  Static Balance activities: near wall : SLS, tandem Dynamic balance gait with head turns, nods  Seated LAQ with ball squeeze x 15  Sit to stand x 15 with 10 lbs add chest press for 2nd set  Squat x 10 x 10 lbs, 2 sets  Wall sit 10 lbs  hold isometric 30 sec  Wall squats x 10  Lateral band walking green band 10 feet x 6  Piriformis stretch and pull   [facility] Adult PT Treatment:                                                DATE: 11/11/22 Therapeutic Exercise: MMT see above  2 min walk test  Lower trunk rotation legs crossed Piriformis stretch (push/pull) 3 x 15-20 sec each side  Sidelying hip abduction x 15 Sidelying clam x 15   Standing step up x 15  each to high knee (quad)  Step back x 10 (6 inch) each (glute)  Manual Therapy: Prone soft tissue work to L low lumbar, glute med, piriformis Modalities: 10 min MHP L hip post                                                                                                                      PT EVAL DATE: 10/25/22  PT eval, HEP, POC , self care    PATIENT EDUCATION:  Education details: PT, POC, HEP hip mobility, back symptoms relation to hip, anatomy  Person educated: Patient Education method: Explanation, Demonstration, and Handouts Education comprehension: verbalized understanding and needs further education  HOME EXERCISE PROGRAM: Access Code: [email or web address] URL: [email or web address] Date: 11/11/2022 Prepared by: Karie Mainland  Exercises - Hip Flexor Stretch at [facility] of Bed  - 1 x daily - 7 x weekly - 1 sets - 3-5 reps - 30 hold - Supine Hip Internal and External Rotation  - 1 x daily - 7 x weekly - 2 sets - 10 reps - 5 hold - Supine Piriformis Stretch with Foot on Ground  - 1 x daily - 7 x weekly - 1 sets - 5 reps - 30 hold - (does not complete due to lateral hip pain) Clamshell  - 1 x daily -  7 x weekly - 2 sets - 10 reps - 5 hold - (does not complete due to lateral hip pain)Sidelying Hip Abduction  - 1 x daily - 7 x weekly - 2 sets - 10 reps - 5 hold  Added 11-29-22 - Single Leg Bridge  - 1 x daily - 7 x weekly - 3 sets - 10 reps - Standing Hip Extension with Counter Support  - 1 x daily - 7 x weekly - 3 sets - 10 reps - Standing Hip Abduction with Counter Support  - 1 x daily - 7 x weekly - 3 sets - 10 reps  ASSESSMENT:  CLINICAL IMPRESSION:  Ms Hargus enters clinic with reports that the TPDN did help  and she consented to [facility] and was closely monitored throughout session.  Pt reports no pain after TPDN and exercise but she states pain does return when she stands and she is at home.  Pt educated with intial PNE education. She does report that overall, she is worse than she was when she started and wonders why her began in the first place although she is leaving today in decreased pain overall.  Pt has one more visit before returning to her MD.   Reminded patient of interventions that were offered my MD at last appt  (ie cortisone vs prednisone). Pt is concerned about the interaction these treatments might have with her Afib.   Will continue TPDN depending on pt report at future PT appt.  Pt has one more appt before DC to reinforce HEP for home use and to return to MD with further intervention as needed.         OBJECTIVE IMPAIRMENTS:  decreased activity tolerance, decreased endurance, decreased knowledge of use of DME, decreased mobility, difficulty walking, decreased ROM, decreased strength, hypomobility, increased fascial restrictions, impaired flexibility, impaired sensation, postural dysfunction, and pain.   ACTIVITY LIMITATIONS: lifting, bending, sitting, standing, squatting, sleeping, stairs, transfers, bed mobility, and locomotion level  PARTICIPATION LIMITATIONS: cleaning, laundry, interpersonal relationship, and community activity  PERSONAL FACTORS: Age and Time since  onset of injury/illness/exacerbation are also affecting patient's functional outcome.   REHAB POTENTIAL: Excellent  CLINICAL DECISION MAKING: Stable/uncomplicated  EVALUATION COMPLEXITY: Low   GOALS:  SHORT TERM GOALS:    1. Pt will be I with initial HEP    Baseline: given on eval    Goal status: ongoing: up to date    2.  Pt will be screened for endurance and balance and goal set   Baseline: NT on eval    Goal status: MET    LONG TERM GOALS: Target date: 12/07/2022    Pt will be I with HEP for hips, core upon discharge  Baseline: unknown  Goal status: INITIAL  2.  Pt will be able to sit for 30 min with no more than min increase in hip pain as she stands up Baseline: 60 min pain mod to severe  Goal status:ongoing   3.  Pt will be able to walk around the block with RPE 6-7 and no increase in hip pain  Baseline: 1/2 block, gets winded easily  Goal status: INITIAL  4.  FOTO score will improve to 67% or better to demo improved functional mobility  Baseline: 58% 11/23/22: 58% Goal status: ONGOING  5. Patient will be able to stand in tandem as well as single leg for > 15 sec each without UE support  Baseline: < 10 sec   Goal Status: NEW  6.  Patient will be able to maintain balance while changing directions and turning head to mimic community environment.   Baseline: min difficulty   Goal status NEW    PLAN:  PT FREQUENCY: 2x/week  PT DURATION: 6-8 weeks  PLANNED INTERVENTIONS: Therapeutic exercises, Therapeutic activity, Neuromuscular re-education, Balance training, Gait training, Patient/Family education, Self Care, Joint mobilization, DME instructions, Dry Needling, Spinal mobilization, Cryotherapy, Moist heat, Ionotophoresis 4mg /ml Dexamethasone, Manual therapy, and Re-evaluation  PLAN FOR NEXT SESSION: progress HEP.  LE and core strength . Endurance   Garen Lah, PT, [facility] Certified Exercise Expert for the Aging Adult  11/29/22 12:45 PM Phone:  [phone] Fax: [phone]

## 2022-12-01 ENCOUNTER — Encounter: Payer: Self-pay | Admitting: Physical Therapy

## 2022-12-01 ENCOUNTER — Ambulatory Visit: Payer: Medicare HMO | Attending: Sports Medicine | Admitting: Physical Therapy

## 2022-12-01 DIAGNOSIS — M25552 Pain in left hip: Secondary | ICD-10-CM | POA: Diagnosis not present

## 2022-12-01 DIAGNOSIS — M25652 Stiffness of left hip, not elsewhere classified: Secondary | ICD-10-CM | POA: Diagnosis not present

## 2022-12-02 ENCOUNTER — Encounter: Payer: Self-pay | Admitting: Sports Medicine

## 2022-12-07 NOTE — Progress Notes (Signed)
    Aleen Sells D.[facility] [address] [place] [address] Phone: [phone]   Assessment and Plan:     There are no diagnoses linked to this encounter.  ***   Pertinent previous records reviewed include ***   Follow Up: ***     Subjective:   I,  , am serving as a Neurosurgeon for Doctor Richardean Sale   Chief Complaint: left leg pain    HPI:    10/10/2022 Patient is a 82 year old female complaining left leg pain. Patient states she has had intermittent pain for 3 year, she states that her leg used to give out on her back before COVID, when she does have pain it will be in her quad and at night it will travel down to her shin, no meds for the pain, she is not in pain now and can't explain what she feels , she feels the pain in her groin and no the hip but doesn't have pain ,    11/15/2022 Patient states that she is the same maybe worst    12/08/2022 Patient states   Relevant Historical Information: On chronic anticoagulation with Xarelto    Additional pertinent review of systems negative.   Current Outpatient Medications:    ascorbic acid (VITAMIN C) 500 MG tablet, Take 500 mg by mouth as needed (feel like getting a cold)., Disp: , Rfl:    Cholecalciferol (VITAMIN D3) 2000 units TABS, Take 2,000 Units by mouth daily., Disp: , Rfl:    EPINEPHrine 0.3 mg/0.3 mL IJ SOAJ injection, Inject 0.3 mg into the muscle daily as needed (for anaphylatic reactions (BEE STINGS)). For bee stings, Disp: , Rfl:    levothyroxine (SYNTHROID, LEVOTHROID) 125 MCG tablet, Take 125 mcg by mouth daily before breakfast., Disp: , Rfl:    Magnesium Citrate POWD, Take 1 Scoop by mouth at bedtime. 325 mg, Disp: , Rfl:    metoprolol tartrate (LOPRESSOR) 25 MG tablet, Take 1 tablet (25 mg total) by mouth as needed. Take 1 tablet (25 mg total) as needed for palpitations and elevated heart rate. May take up to two tablets per day., Disp: 180 tablet, Rfl:  3   Multiple Vitamin (MULTIVITAMIN WITH MINERALS) TABS tablet, Take 1 tablet by mouth daily., Disp: , Rfl:    Multiple Vitamins-Minerals (PRESERVISION AREDS 2 PO), Take 1 tablet by mouth daily., Disp: , Rfl:    olopatadine (PATADAY) 0.1 % ophthalmic solution, Place 1 drop into the left eye daily as needed for allergies., Disp: , Rfl:    rivaroxaban (XARELTO) 20 MG TABS tablet, TAKE 1 TABLET BY MOUTH ONCE DAILY WITH SUPPER, Disp: 90 tablet, Rfl: 1   Objective:     There were no vitals filed for this visit.    There is no height or weight on file to calculate BMI.    Physical Exam:    ***   Electronically signed by:  Aleen Sells D.[facility] 4:21 PM 12/07/22

## 2022-12-08 ENCOUNTER — Telehealth: Payer: Self-pay | Admitting: Internal Medicine

## 2022-12-08 ENCOUNTER — Ambulatory Visit: Payer: Medicare HMO | Admitting: Sports Medicine

## 2022-12-08 ENCOUNTER — Other Ambulatory Visit: Payer: Self-pay

## 2022-12-08 VITALS — HR 74 | Ht 60.0 in | Wt 146.0 lb

## 2022-12-08 DIAGNOSIS — M25552 Pain in left hip: Secondary | ICD-10-CM | POA: Diagnosis not present

## 2022-12-08 DIAGNOSIS — M1612 Unilateral primary osteoarthritis, left hip: Secondary | ICD-10-CM

## 2022-12-08 NOTE — Patient Instructions (Signed)
3 week follow up.

## 2022-12-08 NOTE — Telephone Encounter (Signed)
Patient paged through answering service.  Blood pressure and heart rate elevated upon waking from nap. On discussion with patient, she has taken metoprolol and is feeling better. She denies chest pain, shortness of breath or any other acute concerns. She reports feeling uncertain due to recent steroid injection.  Since she is feeling better, no further intervention at this time. She agreed to follow up with her Electrophysiologist to discuss the Loop recordings at the time of the symptoms to determine if this is atrial fibrillation.  Deitra Mayo, MD MPH [facility]

## 2022-12-09 ENCOUNTER — Telehealth: Payer: Self-pay

## 2022-12-09 ENCOUNTER — Encounter: Payer: Self-pay | Admitting: Sports Medicine

## 2022-12-09 NOTE — Telephone Encounter (Signed)
Remote transmission received and reviewed. Patient is now back in SR. Denies any further episodes. Please see note below about steroid injection.  See paceart for details. Unable to snip current EGM.

## 2022-12-09 NOTE — Telephone Encounter (Signed)
Following alert received from [employer] received for 1 Symptom episode on 12/08/22 at 9:36 pm correlating with 1 AF episode on 12/08/22 at 9:17 pm and 15 Tachy detections.  All available ECGs are c/w AF/AFL with RVR of ongoing duration at 12/08/22 at 9:24 pm, mean V rate 160-180 bpm. Presenting ECG c/w AF at 166 bpm. On Xarelto and metoprolol per Epic. Routed to clinic for review of Symptom + detection.   Attempted to contact patient to send maunal transmission from [facility]. No answer, LMTCB.   Please see note below.

## 2022-12-09 NOTE — Telephone Encounter (Signed)
Please see phone note 12/09/22. I sent patient mychart message requesting manual transmission.

## 2022-12-09 NOTE — Telephone Encounter (Signed)
Pt called back and I have let her know to send another transmission and a nurse will call her when received

## 2022-12-12 NOTE — Telephone Encounter (Signed)
Patient has been scheduled for an appointment with Renee on 8/26.

## 2022-12-19 ENCOUNTER — Ambulatory Visit (INDEPENDENT_AMBULATORY_CARE_PROVIDER_SITE_OTHER): Payer: Medicare HMO

## 2022-12-19 DIAGNOSIS — I483 Typical atrial flutter: Secondary | ICD-10-CM | POA: Diagnosis not present

## 2022-12-20 ENCOUNTER — Encounter: Payer: Self-pay | Admitting: Cardiology

## 2022-12-20 LAB — CUP PACEART REMOTE DEVICE CHECK
Date Time Interrogation Session: 20240819020024
Implantable Pulse Generator Implant Date: 20240718
Pulse Gen Serial Number: 511035918

## 2022-12-20 NOTE — Telephone Encounter (Signed)
Spoke with patient informed her that she was in AF with RVR, patient took PRN metoprolol which helped with symptomatic HR    Pt. initiated a second transmission for "fast HR", lasting longer than usual Events for AF/AFL with RVR, duration 30sec, mean HR 153 2 tachy EGM's, longest duration 11sec, mean HR's 177-199, AF/AFL with RVR Burden 55%, Xarelto - Route to triage

## 2022-12-25 NOTE — Progress Notes (Unsigned)
Cardiology Office Note Date:  12/25/2022  Patient ID:  Tammy [name] 10-15-1940, MRN [id number] PCP:  Creola Corn, MD  Cardiologist:  Dr. Shari Prows Electrophysiologist: Dr. Lalla Brothers  ***refresh   Chief Complaint: *** ILR visit  History of Present Illness: Tammy [name] is a 82 y.o. female with history of HLD, hypothyroidism, AFib  Initial referral to cardiology after a syncopal event monitor was placed and demonstrated 1 run of SVT lasting 23 seconds with no pauses or ventricular tachycardia..   TTE noted EF of 60/65%, no RWMA, grade 2 DD, normal RV SF, mild LAE, trivial MR, mild AV sclerosis without stenosis.   Coronary calcium score obtained to rule out ischemic work-up that showed no calcium and aortic atherosclerosis.  01/27/21 ER visit with CP/palpitations, found in AFlutter  Saw EP, preferred to avoid procedures, maintained on BB  More symptoms  CTI ablation 07/11/22  Saw Dr. Lalla Brothers 08/09/23, planned to wean off her metoprolol and stop Xarelto at 3 mo mark. Saw Dr. Lalla Brothers 11/17/22, loop implanted, if no AFib develops/noted plan to stop xarelto 3 mo  + AFib noted prerhaps triggered by steroid shot for her hip  *** burden *** rates *** prefers no addition meds if able *** xarelto, labs, dose, bleeding     AF/AAD hx AFlutter Diagnosed Sept 2022 CTI March 2024  Device information Abbott ILR implanted 11/17/22 for AF surveillance   Past Medical History:  Diagnosis Date   Hypothyroidism    Skin lesion of neck    Anterior Neck (Thyroid Tissue)   Thyroid disease     Past Surgical History:  Procedure Laterality Date   A-FLUTTER ABLATION N/A 07/11/2022   Procedure: A-FLUTTER ABLATION;  Surgeon: Lanier Prude, MD;  Location: [facility] INVASIVE CV LAB;  Service: Cardiovascular;  Laterality: N/A;   ANAL FISSURE REPAIR     broken elbow     pins   ELBOW SURGERY Right    EYE SURGERY     bilateral cataracts    FOOT SURGERY Left    LESION REMOVAL N/A 10/11/2017    Procedure: EXCISION ANTERIOR NECK SKIN LESION ERAS PATHWAY;  Surgeon: Abigail Miyamoto, MD;  Location: [facility] OR;  Service: General;  Laterality: N/A;   needle removed from foot     THYROID SURGERY     TONSILLECTOMY     TUBAL LIGATION      Current Outpatient Medications  Medication Sig Dispense Refill   ascorbic acid (VITAMIN C) 500 MG tablet Take 500 mg by mouth as needed (feel like getting a cold).     Cholecalciferol (VITAMIN D3) 2000 units TABS Take 2,000 Units by mouth daily.     EPINEPHrine 0.3 mg/0.3 mL IJ SOAJ injection Inject 0.3 mg into the muscle daily as needed (for anaphylatic reactions (BEE STINGS)). For bee stings     levothyroxine (SYNTHROID, LEVOTHROID) 125 MCG tablet Take 125 mcg by mouth daily before breakfast.     Magnesium Citrate POWD Take 1 Scoop by mouth at bedtime. 325 mg     metoprolol tartrate (LOPRESSOR) 25 MG tablet Take 1 tablet (25 mg total) by mouth as needed. Take 1 tablet (25 mg total) as needed for palpitations and elevated heart rate. May take up to two tablets per day. 180 tablet 3   Multiple Vitamin (MULTIVITAMIN WITH MINERALS) TABS tablet Take 1 tablet by mouth daily.     Multiple Vitamins-Minerals (PRESERVISION AREDS 2 PO) Take 1 tablet by mouth daily.     olopatadine (PATADAY) 0.1 %  ophthalmic solution Place 1 drop into the left eye daily as needed for allergies.     rivaroxaban (XARELTO) 20 MG TABS tablet TAKE 1 TABLET BY MOUTH ONCE DAILY WITH SUPPER 90 tablet 1   No current facility-administered medications for this visit.    Allergies:   Bee venom   Social History:  The patient  reports that she has quit smoking. She has never used smokeless tobacco. She reports that she does not drink alcohol and does not use drugs.   Family History:  The patient's family history includes Heart disease in her father; Stomach cancer in her paternal grandmother.  ROS:  Please see the history of present illness.    All other systems are reviewed and otherwise  negative.   PHYSICAL EXAM:  VS:  There were no vitals taken for this visit. BMI: There is no height or weight on file to calculate BMI. Well nourished, well developed, in no acute distress HEENT: normocephalic, atraumatic Neck: no JVD, carotid bruits or masses Cardiac:  *** RRR; no significant murmurs, no rubs, or gallops Lungs:  *** CTA b/l, no wheezing, rhonchi or rales Abd: soft, nontender MS: no deformity or *** atrophy Ext: *** no edema Skin: warm and dry, no rash Neuro:  No gross deficits appreciated Psych: euthymic mood, full affect  *** ILR site is stable, no tethering or discomfort   EKG:  Done today and reviewed by myself shows      Device interrogation done today and reviewed by myself:  ***  07/11/22: EPS/ablation CONCLUSIONS:   1. Typical atrial flutter.   2. Successful radiofrequency ablation of atrial flutter along the cavotricuspid isthmus with complete bidirectional isthmus block achieved.   3. No inducible arrhythmias following ablation.   4. No early apparent complications.   Recent Labs: 06/27/2022: BUN 17; Creatinine, Ser 0.63; Hemoglobin 12.8; Platelets 322; Potassium 5.1; Sodium 132  No results found for requested labs within last 365 days.   CrCl cannot be calculated (Patient's most recent lab result is older than the maximum 21 days allowed.).   Wt Readings from Last 3 Encounters:  12/08/22 146 lb (66.2 kg)  11/17/22 146 lb (66.2 kg)  11/15/22 145 lb (65.8 kg)     Other studies reviewed: Additional studies/records reviewed today include: summarized above  ASSESSMENT AND PLAN:  Typical AFlutter ablated Paroxysmal Afib Subequent to her CTI ablation ?provoked CHA2DS2Vasc is 3, on Xarelto, *** appropriately dosed ***  burden ***  3.  Secondary hypercoagulable state   Disposition: F/u with ***  Current medicines are reviewed at length with the patient today.  The patient did not have any concerns regarding medicines.  Norma Fredrickson, PA-C 12/25/2022 9:37 AM     [facility] [address] [place] [address] [phone] (office)  [phone] (fax)

## 2022-12-26 ENCOUNTER — Ambulatory Visit: Payer: Medicare HMO | Attending: Physician Assistant | Admitting: Physician Assistant

## 2022-12-26 ENCOUNTER — Encounter: Payer: Self-pay | Admitting: Physician Assistant

## 2022-12-26 VITALS — BP 142/82 | HR 55 | Ht 63.0 in | Wt 146.8 lb

## 2022-12-26 DIAGNOSIS — D6869 Other thrombophilia: Secondary | ICD-10-CM | POA: Diagnosis not present

## 2022-12-26 DIAGNOSIS — I480 Paroxysmal atrial fibrillation: Secondary | ICD-10-CM | POA: Diagnosis not present

## 2022-12-26 DIAGNOSIS — Z4509 Encounter for adjustment and management of other cardiac device: Secondary | ICD-10-CM | POA: Diagnosis not present

## 2022-12-26 DIAGNOSIS — I483 Typical atrial flutter: Secondary | ICD-10-CM

## 2022-12-26 DIAGNOSIS — I739 Peripheral vascular disease, unspecified: Secondary | ICD-10-CM | POA: Diagnosis not present

## 2022-12-26 DIAGNOSIS — Z79899 Other long term (current) drug therapy: Secondary | ICD-10-CM | POA: Diagnosis not present

## 2022-12-26 LAB — CUP PACEART INCLINIC DEVICE CHECK
Date Time Interrogation Session: 20240826124545
Implantable Pulse Generator Implant Date: 20240718
Pulse Gen Serial Number: 511035918

## 2022-12-26 NOTE — Patient Instructions (Signed)
Medication Instructions:    *If you need a refill on your cardiac medications before your next appointment, please call your pharmacy*   Lab Work: BMET AND CBC TODAY    If you have labs (blood work) drawn today and your tests are completely normal, you will receive your results only by: MyChart Message (if you have MyChart) OR A paper copy in the mail If you have any lab test that is abnormal or we need to change your treatment, we will call you to review the results.   Testing/Procedures: Your physician has requested that you have an ankle brachial index (ABI). During this test an ultrasound and blood pressure cuff are used to evaluate the arteries that supply the arms and legs with blood. Allow thirty minutes for this exam. There are no restrictions or special instructions. Your physician has requested that you have a lower extremity arterial exercise duplex. During this test, exercise and ultrasound are used to evaluate arterial blood flow in the legs. Allow one hour for this exam. There are no restrictions or special instructions.      Follow-Up: At [facility], you and your health needs are our priority.  As part of our continuing mission to provide you with exceptional heart care, we have created designated Provider Care Teams.  These Care Teams include your primary Cardiologist (physician) and Advanced Practice Providers (APPs -  Physician Assistants and Nurse Practitioners) who all work together to provide you with the care you need, when you need it.  We recommend signing up for the patient portal called "MyChart".  Sign up information is provided on this After Visit Summary.  MyChart is used to connect with patients for Virtual Visits (Telemedicine).  Patients are able to view lab/test results, encounter notes, upcoming appointments, etc.  Non-urgent messages can be sent to your provider as well.   To learn more about what you can do with MyChart, go to  [email or web address].    Your next appointment:   2 month(s)  Provider:   Steffanie Dunn, MD or Francis Dowse, PA-C    Other Instructions

## 2022-12-27 ENCOUNTER — Ambulatory Visit (HOSPITAL_COMMUNITY)
Admission: RE | Admit: 2022-12-27 | Discharge: 2022-12-27 | Disposition: A | Discharge status: Home / Self Care | Payer: Medicare HMO | Source: Ambulatory Visit | Attending: Physician Assistant | Admitting: Physician Assistant

## 2022-12-27 DIAGNOSIS — I739 Peripheral vascular disease, unspecified: Secondary | ICD-10-CM | POA: Diagnosis not present

## 2022-12-27 LAB — CBC
Hematocrit: 38.3 % (ref 34.0–46.6)
Hemoglobin: 12.9 g/dL (ref 11.1–15.9)
MCH: 30.9 pg (ref 26.6–33.0)
MCHC: 33.7 g/dL (ref 31.5–35.7)
MCV: 92 fL (ref 79–97)
Platelets: 346 10*3/uL (ref 150–450)
RBC: 4.18 x10E6/uL (ref 3.77–5.28)
RDW: 12.8 % (ref 11.7–15.4)
WBC: 7.3 10*3/uL (ref 3.4–10.8)

## 2022-12-27 LAB — BASIC METABOLIC PANEL
BUN/Creatinine Ratio: 22 (ref 12–28)
BUN: 15 mg/dL (ref 8–27)
CO2: 24 mmol/L (ref 20–29)
Calcium: 9.8 mg/dL (ref 8.7–10.3)
Chloride: 97 mmol/L (ref 96–106)
Creatinine, Ser: 0.67 mg/dL (ref 0.57–1.00)
Glucose: 93 mg/dL (ref 70–99)
Potassium: 4.5 mmol/L (ref 3.5–5.2)
Sodium: 132 mmol/L — ABNORMAL LOW (ref 134–144)
eGFR: 87 mL/min/{1.73_m2} (ref >59)

## 2022-12-28 NOTE — Progress Notes (Unsigned)
    Tammy [name] D.[name] [address] [place] [address] Phone: [phone]   Assessment and Plan:     There are no diagnoses linked to this encounter.  ***   Pertinent previous records reviewed include ***   Follow Up: ***     Subjective:   I, Tammy [name], am serving as a Neurosurgeon for Doctor Richardean Sale   Chief Complaint: left leg pain    HPI:    10/10/2022 Patient is a 82 year old female complaining left leg pain. Patient states she has had intermittent pain for 3 year, she states that her leg used to give out on her back before COVID, when she does have pain it will be in her quad and at night it will travel down to her shin, no meds for the pain, she is not in pain now and can't explain what she feels , she feels the pain in her groin and no the hip but doesn't have pain ,    11/15/2022 Patient states that she is the same maybe worst    12/08/2022 Patient states PT made the leg worst way worst . Since she hasn't been doing the exercises her pain has decreased   12/29/2022 Patient states     Relevant Historical Information: On chronic anticoagulation with Xarelto  Additional pertinent review of systems negative.   Current Outpatient Medications:    ascorbic acid (VITAMIN C) 500 MG tablet, Take 500 mg by mouth as needed (feel like getting a cold)., Disp: , Rfl:    Cholecalciferol (VITAMIN D3) 2000 units TABS, Take 2,000 Units by mouth daily., Disp: , Rfl:    EPINEPHrine 0.3 mg/0.3 mL IJ SOAJ injection, Inject 0.3 mg into the muscle daily as needed (for anaphylatic reactions (BEE STINGS)). For bee stings, Disp: , Rfl:    levothyroxine (SYNTHROID, LEVOTHROID) 125 MCG tablet, Take 125 mcg by mouth daily before breakfast., Disp: , Rfl:    Magnesium Citrate POWD, Take 1 Scoop by mouth at bedtime. 325 mg, Disp: , Rfl:    metoprolol tartrate (LOPRESSOR) 25 MG tablet, Take 1 tablet (25 mg total) by mouth as needed. Take 1 tablet  (25 mg total) as needed for palpitations and elevated heart rate. May take up to two tablets per day., Disp: 180 tablet, Rfl: 3   Multiple Vitamin (MULTIVITAMIN WITH MINERALS) TABS tablet, Take 1 tablet by mouth daily., Disp: , Rfl:    Multiple Vitamins-Minerals (PRESERVISION AREDS 2 PO), Take 1 tablet by mouth daily., Disp: , Rfl:    olopatadine (PATADAY) 0.1 % ophthalmic solution, Place 1 drop into the left eye daily as needed for allergies., Disp: , Rfl:    rivaroxaban (XARELTO) 20 MG TABS tablet, TAKE 1 TABLET BY MOUTH ONCE DAILY WITH SUPPER, Disp: 90 tablet, Rfl: 1   Objective:     There were no vitals filed for this visit.    There is no height or weight on file to calculate BMI.    Physical Exam:    ***   Electronically signed by:  Tammy [name] D.[name] 12:16 PM 12/28/22

## 2022-12-29 ENCOUNTER — Encounter: Payer: Self-pay | Admitting: Cardiology

## 2022-12-29 ENCOUNTER — Ambulatory Visit: Payer: Medicare HMO | Admitting: Sports Medicine

## 2022-12-29 VITALS — HR 72 | Ht 63.0 in | Wt 145.0 lb

## 2022-12-29 DIAGNOSIS — M1612 Unilateral primary osteoarthritis, left hip: Secondary | ICD-10-CM | POA: Diagnosis not present

## 2022-12-29 DIAGNOSIS — M25552 Pain in left hip: Secondary | ICD-10-CM

## 2022-12-29 LAB — VAS US ABI WITH/WO TBI
Left ABI: 1.02
Right ABI: 1.08

## 2022-12-29 NOTE — Progress Notes (Signed)
[facility] Loop Recorder  

## 2022-12-29 NOTE — Patient Instructions (Signed)
Tylenol and Voltaren gel topically as needed  As needed follow up

## 2023-01-04 ENCOUNTER — Encounter: Payer: Self-pay | Admitting: Cardiology

## 2023-01-05 ENCOUNTER — Telehealth: Payer: Self-pay

## 2023-01-05 DIAGNOSIS — M810 Age-related osteoporosis without current pathological fracture: Secondary | ICD-10-CM | POA: Diagnosis not present

## 2023-01-05 DIAGNOSIS — D6869 Other thrombophilia: Secondary | ICD-10-CM | POA: Diagnosis not present

## 2023-01-05 DIAGNOSIS — I483 Typical atrial flutter: Secondary | ICD-10-CM | POA: Diagnosis not present

## 2023-01-05 DIAGNOSIS — I10 Essential (primary) hypertension: Secondary | ICD-10-CM | POA: Diagnosis not present

## 2023-01-05 DIAGNOSIS — E890 Postprocedural hypothyroidism: Secondary | ICD-10-CM | POA: Diagnosis not present

## 2023-01-05 DIAGNOSIS — I700 Atherosclerosis of aorta: Secondary | ICD-10-CM | POA: Diagnosis not present

## 2023-01-05 DIAGNOSIS — E039 Hypothyroidism, unspecified: Secondary | ICD-10-CM | POA: Diagnosis not present

## 2023-01-05 DIAGNOSIS — R739 Hyperglycemia, unspecified: Secondary | ICD-10-CM | POA: Diagnosis not present

## 2023-01-05 DIAGNOSIS — E785 Hyperlipidemia, unspecified: Secondary | ICD-10-CM | POA: Diagnosis not present

## 2023-01-05 NOTE — Telephone Encounter (Signed)
I spoke with the patient and called [facility] tech support for additional help. Symptom episode was off on our end and that is why we did not receive the patient transmission. I have fixed it on our end however whatever symptom she pressed before it was fixed the clinic will not be able to see until her scheduled transmission. If she has any symptoms for now on she will be able to force a transmission.

## 2023-01-19 ENCOUNTER — Ambulatory Visit (INDEPENDENT_AMBULATORY_CARE_PROVIDER_SITE_OTHER): Payer: Medicare HMO

## 2023-01-19 DIAGNOSIS — R55 Syncope and collapse: Secondary | ICD-10-CM | POA: Diagnosis not present

## 2023-01-19 LAB — CUP PACEART REMOTE DEVICE CHECK
Date Time Interrogation Session: 20240919020804
Implantable Pulse Generator Implant Date: 20240718
Pulse Gen Serial Number: 511035918

## 2023-01-24 ENCOUNTER — Encounter: Payer: Self-pay | Admitting: Cardiology

## 2023-01-25 ENCOUNTER — Telehealth: Payer: Self-pay | Admitting: Cardiology

## 2023-01-25 NOTE — Telephone Encounter (Signed)
Manual transmission received.   AF w/ RVR noted w/ longest in duration 2 hours. Patient reports suddenly waking up around 3:00 am with palpitations. Reports history is this since covid vaccine although this episode was worse than previous. Took lopressor 25 mg and did note improvement. Discussed option of AF clinic with patient considering symptoms, patient is agreeable. Advised I will send message and someone will contact her for apt info. Pt agreeable to plan.

## 2023-01-25 NOTE — Telephone Encounter (Signed)
Attempted to call pt to send manual transmission. No answer, LMTCB.

## 2023-01-25 NOTE — Telephone Encounter (Signed)
Patient returned RN's call regarding manual transmission.

## 2023-01-31 NOTE — Progress Notes (Signed)
[facility] Summary Report / Loop Recorder 

## 2023-02-01 ENCOUNTER — Encounter (HOSPITAL_COMMUNITY): Payer: Self-pay | Admitting: Physician Assistant

## 2023-02-01 ENCOUNTER — Ambulatory Visit (HOSPITAL_COMMUNITY)
Admission: RE | Admit: 2023-02-01 | Discharge: 2023-02-01 | Disposition: A | Discharge status: Home / Self Care | Payer: Medicare HMO | Source: Ambulatory Visit | Attending: Physician Assistant | Admitting: Physician Assistant

## 2023-02-01 VITALS — BP 122/80 | HR 65 | Ht 63.0 in | Wt 145.6 lb

## 2023-02-01 DIAGNOSIS — Z79899 Other long term (current) drug therapy: Secondary | ICD-10-CM | POA: Insufficient documentation

## 2023-02-01 DIAGNOSIS — I480 Paroxysmal atrial fibrillation: Secondary | ICD-10-CM | POA: Insufficient documentation

## 2023-02-01 DIAGNOSIS — R0609 Other forms of dyspnea: Secondary | ICD-10-CM | POA: Insufficient documentation

## 2023-02-01 DIAGNOSIS — R5383 Other fatigue: Secondary | ICD-10-CM | POA: Diagnosis not present

## 2023-02-01 DIAGNOSIS — I4892 Unspecified atrial flutter: Secondary | ICD-10-CM | POA: Diagnosis not present

## 2023-02-01 DIAGNOSIS — E039 Hypothyroidism, unspecified: Secondary | ICD-10-CM | POA: Insufficient documentation

## 2023-02-01 DIAGNOSIS — D6869 Other thrombophilia: Secondary | ICD-10-CM | POA: Diagnosis not present

## 2023-02-01 DIAGNOSIS — Z7901 Long term (current) use of anticoagulants: Secondary | ICD-10-CM | POA: Diagnosis not present

## 2023-02-01 MED ORDER — APIXABAN 5 MG PO TABS: 5.0000 mg | ORAL_TABLET | Freq: Two times a day (BID) | ORAL | 3 refills | Status: DC

## 2023-02-01 NOTE — Progress Notes (Signed)
Primary Care Physician: Creola Corn, MD Primary Cardiologist: Meriam Sprague, MD Electrophysiologist: Lanier Prude, MD  Referring Physician: Device clinic    Tammy [name] is a 82 y.o. female with a history of hypothyroidism, atrial flutter, atrial fibrillation who presents for follow up in the [facility].  The patient was initially diagnosed with atrial flutter and underwent CTI ablation with Dr Lalla Brothers 07/11/22. An ILR was placed to monitor for afib. She did have a 2 hour afib episode on 01/25/23. Patient is on Xarelto for a CHADS2VASC score of 3.  On follow up today, patient is in SR today. She states that she does feel palpitations when in afib but it is not as severe as her atrial flutter. She has noticed more fatigue and SOB on exertion. No chest pain. Patient attributes her symptoms to taking Xarelto.   Today, she denies symptoms of palpitations, chest pain, orthopnea, PND, lower extremity edema, dizziness, presyncope, syncope, snoring, daytime somnolence, bleeding, or neurologic sequela. The patient is tolerating medications without difficulties and is otherwise without complaint today.    Atrial Fibrillation Risk Factors:  she does not have symptoms or diagnosis of sleep apnea. she does not have a history of rheumatic fever.   Atrial Fibrillation Management history:  Previous antiarrhythmic drugs: none Previous cardioversions: none Previous ablations: 07/11/22 Anticoagulation history: Xarelto  ROS- All systems are reviewed and negative except as per the HPI above.  Past Medical History:  Diagnosis Date   Hypothyroidism    Skin lesion of neck    Anterior Neck (Thyroid Tissue)   Thyroid disease     Current Outpatient Medications  Medication Sig Dispense Refill   ascorbic acid (VITAMIN C) 500 MG tablet Take 500 mg by mouth as needed (feel like getting a cold).     Cholecalciferol (VITAMIN D3) 2000 units TABS Take 2,000 Units by  mouth daily.     EPINEPHrine 0.3 mg/0.3 mL IJ SOAJ injection Inject 0.3 mg into the muscle daily as needed (for anaphylatic reactions (BEE STINGS)). For bee stings     levothyroxine (SYNTHROID, LEVOTHROID) 125 MCG tablet Take 125 mcg by mouth daily before breakfast.     Magnesium Citrate POWD Take 1 Scoop by mouth at bedtime. 325 mg     metoprolol tartrate (LOPRESSOR) 25 MG tablet Take 1 tablet (25 mg total) by mouth as needed. Take 1 tablet (25 mg total) as needed for palpitations and elevated heart rate. May take up to two tablets per day. 180 tablet 3   Multiple Vitamin (MULTIVITAMIN WITH MINERALS) TABS tablet Take 1 tablet by mouth daily.     Multiple Vitamins-Minerals (PRESERVISION AREDS 2 PO) Take 1 tablet by mouth daily.     olopatadine (PATADAY) 0.1 % ophthalmic solution Place 1 drop into the left eye daily as needed for allergies.     rivaroxaban (XARELTO) 20 MG TABS tablet TAKE 1 TABLET BY MOUTH ONCE DAILY WITH SUPPER 90 tablet 1   No current facility-administered medications for this encounter.    Physical Exam: BP 122/80   Pulse 65   Ht 5\' 3"  (1.6 m)   Wt 66 kg   BMI 25.79 kg/m   GEN: Well nourished, well developed in no acute distress NECK: No JVD; No carotid bruits CARDIAC: Regular rate and rhythm, no murmurs, rubs, gallops RESPIRATORY:  Clear to auscultation without rales, wheezing or rhonchi  ABDOMEN: Soft, non-tender, non-distended EXTREMITIES:  No edema; No deformity   Wt Readings from Last 3  Encounters:  02/01/23 66 kg  12/29/22 65.8 kg  12/26/22 66.6 kg     EKG today demonstrates  SR Vent. rate 65 BPM PR interval 142 ms QRS duration 84 ms QT/QTcB 382/397 ms  Echo 08/25/20 demonstrated   1. Left ventricular ejection fraction, by estimation, is 60 to 65%. The  left ventricle has normal function. The left ventricle has no regional  wall motion abnormalities. Left ventricular diastolic parameters are  consistent with Grade II diastolic dysfunction  (pseudonormalization). Elevated left ventricular end-diastolic pressure.   2. Right ventricular systolic function is normal. The right ventricular  size is normal. There is normal pulmonary artery systolic pressure. The  estimated right ventricular systolic pressure is 33.7 mmHg.   3. Left atrial size was mildly dilated.   4. The mitral valve is normal in structure. Trivial mitral valve  regurgitation. No evidence of mitral stenosis.   5. The aortic valve is tricuspid. Aortic valve regurgitation is not  visualized. Mild aortic valve sclerosis is present, with no evidence of  aortic valve stenosis.   6. The inferior vena cava is normal in size with greater than 50%  respiratory variability, suggesting right atrial pressure of 3 mmHg.    CHA2DS2-VASc Score = 3  The patient's score is based upon: CHF History: 0 HTN History: 0 Diabetes History: 0 Stroke History: 0 Vascular Disease History: 0 Age Score: 2 Gender Score: 1       ASSESSMENT AND PLAN: Paroxysmal Atrial Fibrillation/atrial flutter The patient's CHA2DS2-VASc score is 3, indicating a 3.2% annual risk of stroke.   S/p CTI ablation 07/11/22 Patient in SR today She would prefer to stay off scheduled medications. Continue Lopressor 25 mg PRN q 12 hours for heart racing Will stop Xarelto and start Eliquis 5 mg BID (recent lab work reviewed)  Secondary Hypercoagulable State (ICD10:  D68.69) The patient is at significant risk for stroke/thromboembolism based upon her CHA2DS2-VASc Score of 3.  Start Apixaban (Eliquis).   Fatigue/DOE Appears out of proportion to her arrhythmia burden. CBC 12/26/22 within normal limits.  TSH 0.57 on 01/06/23 Check echocardiogram CAC score 0 on CT in 2022 Change to Eliquis as above    Follow up in the AF clinic in one month.       Jorja Loa PA-C Afib Clinic [facility] [address] [place], [place] [address] [phone]

## 2023-02-01 NOTE — Patient Instructions (Signed)
Stop Xarelto   Start Eliquis 5mg  TWICE a day

## 2023-02-06 ENCOUNTER — Encounter: Payer: Self-pay | Admitting: Cardiology

## 2023-02-08 ENCOUNTER — Ambulatory Visit (HOSPITAL_COMMUNITY)
Admission: RE | Admit: 2023-02-08 | Discharge: 2023-02-08 | Disposition: A | Discharge status: Home / Self Care | Payer: Medicare HMO | Source: Ambulatory Visit | Attending: Physician Assistant | Admitting: Physician Assistant

## 2023-02-08 DIAGNOSIS — I480 Paroxysmal atrial fibrillation: Secondary | ICD-10-CM | POA: Diagnosis not present

## 2023-02-08 DIAGNOSIS — I498 Other specified cardiac arrhythmias: Secondary | ICD-10-CM

## 2023-02-08 LAB — ECHOCARDIOGRAM COMPLETE
AR max vel: 1.91 cm2
AV Area VTI: 1.9 cm2
AV Area mean vel: 1.75 cm2
AV Mean grad: 5 mm[Hg]
AV Peak grad: 6.5 mm[Hg]
Ao pk vel: 1.27 m/s
Area-P 1/2: 2.05 cm2
S' Lateral: 3.3 cm

## 2023-02-17 ENCOUNTER — Encounter: Payer: Self-pay | Admitting: Cardiology

## 2023-02-20 ENCOUNTER — Ambulatory Visit (INDEPENDENT_AMBULATORY_CARE_PROVIDER_SITE_OTHER): Payer: Medicare HMO

## 2023-02-20 ENCOUNTER — Encounter: Payer: Medicare HMO | Admitting: Physician Assistant

## 2023-02-20 DIAGNOSIS — I483 Typical atrial flutter: Secondary | ICD-10-CM

## 2023-02-20 LAB — CUP PACEART REMOTE DEVICE CHECK
Implantable Pulse Generator Implant Date: 20240718
Pulse Gen Serial Number: 511035918

## 2023-02-22 LAB — CUP PACEART REMOTE DEVICE CHECK: Date Time Interrogation Session: 20241021020406

## 2023-03-06 ENCOUNTER — Encounter: Payer: Self-pay | Admitting: Cardiology

## 2023-03-07 ENCOUNTER — Other Ambulatory Visit (HOSPITAL_COMMUNITY): Payer: Self-pay | Admitting: *Deleted

## 2023-03-07 MED ORDER — RIVAROXABAN 20 MG PO TABS: 20.0000 mg | ORAL_TABLET | Freq: Every day | ORAL | 6 refills | Status: DC

## 2023-03-08 ENCOUNTER — Ambulatory Visit (HOSPITAL_COMMUNITY): Payer: Medicare HMO | Admitting: Physician Assistant

## 2023-03-08 NOTE — Progress Notes (Signed)
[facility] Loop Recorder  

## 2023-03-09 ENCOUNTER — Telehealth: Payer: Self-pay

## 2023-03-09 NOTE — Telephone Encounter (Addendum)
ILR alert for symptom, fast HR with tachy event Event occurred 11/7 @ 12:39, duration 21sec, mean HR 175 EGM appear SR/ST with ectopy - rotue to triage Patient has known AF is on OAC.   Appears tachycardia with periods of going in and out of AF.  I have left message for patient to call back and discuss.  She is being followed by R. Fenton PA in the afib clinic, will forward to them as well if any further follow up with patient needed with sx. Event.       Marland Kitchen

## 2023-03-10 NOTE — Telephone Encounter (Signed)
Per Jorja Loa PA no changes at this time. Follow up as scheduled later this month.

## 2023-03-13 ENCOUNTER — Encounter: Payer: Self-pay | Admitting: Cardiology

## 2023-03-15 NOTE — Telephone Encounter (Signed)
Spoke with patient.  Made her aware of R. Fenton's PA instructions.   She is doing well but does note increased SOB when exerting herself (ie: walking up hills). Recent ECHO EF 50-55%.   I have asked her to discuss this with afib clinic at appt next week.  She thinks it is related to the Eliquis.

## 2023-03-17 ENCOUNTER — Telehealth: Payer: Self-pay

## 2023-03-17 NOTE — Telephone Encounter (Signed)
Alert received from [employer] for 1 Symptom event on 03/16/23 at 11:43 pm correlating with 2 Tachy episodes on 03/16/23 at 11:40 and 11:42 and 1 AF episode on 03/16/23 at 11:39 of ongoing duration at time of transmission (03/16/23 at 11:44), reported symptom of fast heart rate. Longest Tachy episode 43 sec. ECGs c/w intermittent AF with RVR. Average V rates during Tachy episodes 154-171 bpm. Hx of PAF and on Xarelto per Epic.  Pt has hx of above. Called patient to send a manual transmission to see if she is back in SR.      See below for updated presenting rhythm. Patient pressed symptom event to send a manual transmission although did not have syncope event. States she is feeling fine now. Has AF f/u 03/20/23. Patient aware of call if she has questions or concerns.

## 2023-03-20 ENCOUNTER — Ambulatory Visit (HOSPITAL_COMMUNITY)
Admission: RE | Admit: 2023-03-20 | Discharge: 2023-03-20 | Disposition: A | Discharge status: Home / Self Care | Payer: Medicare HMO | Source: Ambulatory Visit | Attending: Physician Assistant | Admitting: Physician Assistant

## 2023-03-20 VITALS — BP 122/76 | HR 69 | Ht 63.0 in | Wt 144.4 lb

## 2023-03-20 DIAGNOSIS — I480 Paroxysmal atrial fibrillation: Secondary | ICD-10-CM | POA: Insufficient documentation

## 2023-03-20 DIAGNOSIS — D6869 Other thrombophilia: Secondary | ICD-10-CM | POA: Insufficient documentation

## 2023-03-20 DIAGNOSIS — Z79899 Other long term (current) drug therapy: Secondary | ICD-10-CM | POA: Insufficient documentation

## 2023-03-20 DIAGNOSIS — I4892 Unspecified atrial flutter: Secondary | ICD-10-CM | POA: Insufficient documentation

## 2023-03-20 DIAGNOSIS — E039 Hypothyroidism, unspecified: Secondary | ICD-10-CM | POA: Insufficient documentation

## 2023-03-20 DIAGNOSIS — Z7901 Long term (current) use of anticoagulants: Secondary | ICD-10-CM | POA: Insufficient documentation

## 2023-03-20 NOTE — Progress Notes (Signed)
Primary Care Physician: Creola Corn, MD Primary Cardiologist: Meriam Sprague, MD (Inactive) Electrophysiologist: Lanier Prude, MD  Referring Physician: Device clinic    Tammy [name] is a 82 y.o. female with a history of hypothyroidism, atrial flutter, atrial fibrillation who presents for follow up in the [facility].  The patient was initially diagnosed with atrial flutter and underwent CTI ablation with Dr Lalla Brothers 07/11/22. An ILR was placed to monitor for afib. She did have a 2 hour afib episode on 01/25/23. Patient is on Xarelto for a CHADS2VASC score of 3.  On follow up today, patient reports that she felt more fatigued on Eliquis and she transitioned back to Xarelto. Her ILR has shown several brief afib episodes, longest ~45 minutes. Her symptoms occur mostly at night. She denies snoring, witnessed apnea, or daytime somnolence.   Today, she denies symptoms of chest pain, orthopnea, PND, lower extremity edema, dizziness, presyncope, syncope, snoring, daytime somnolence, bleeding, or neurologic sequela. The patient is tolerating medications without difficulties and is otherwise without complaint today.    Atrial Fibrillation Risk Factors:  she does not have symptoms or diagnosis of sleep apnea. she does not have a history of rheumatic fever.   Atrial Fibrillation Management history:  Previous antiarrhythmic drugs: none Previous cardioversions: none Previous ablations: 07/11/22 Anticoagulation history: Xarelto  ROS- All systems are reviewed and negative except as per the HPI above.  Past Medical History:  Diagnosis Date   Hypothyroidism    Skin lesion of neck    Anterior Neck (Thyroid Tissue)   Thyroid disease     Current Outpatient Medications  Medication Sig Dispense Refill   ascorbic acid (VITAMIN C) 500 MG tablet Take 500 mg by mouth as needed (feel like getting a cold).     Cholecalciferol (VITAMIN D3) 2000 units TABS Take 2,000  Units by mouth daily.     EPINEPHrine 0.3 mg/0.3 mL IJ SOAJ injection Inject 0.3 mg into the muscle daily as needed (for anaphylatic reactions (BEE STINGS)). For bee stings     levothyroxine (SYNTHROID, LEVOTHROID) 125 MCG tablet Take 125 mcg by mouth daily before breakfast.     Magnesium Citrate POWD Take 1 Scoop by mouth at bedtime. 325 mg     metoprolol tartrate (LOPRESSOR) 25 MG tablet Take 1 tablet (25 mg total) by mouth as needed. Take 1 tablet (25 mg total) as needed for palpitations and elevated heart rate. May take up to two tablets per day. 180 tablet 3   Multiple Vitamin (MULTIVITAMIN WITH MINERALS) TABS tablet Take 1 tablet by mouth daily.     Multiple Vitamins-Minerals (PRESERVISION AREDS 2 PO) Take 1 tablet by mouth daily.     olopatadine (PATADAY) 0.1 % ophthalmic solution Place 1 drop into the left eye daily as needed for allergies.     rivaroxaban (XARELTO) 20 MG TABS tablet Take 1 tablet (20 mg total) by mouth daily with supper. 30 tablet 6   No current facility-administered medications for this encounter.    Physical Exam: BP 122/76   Pulse 69   Ht 5\' 3"  (1.6 m)   Wt 65.5 kg   BMI 25.58 kg/m   GEN: Well nourished, well developed in no acute distress NECK: No JVD; No carotid bruits CARDIAC: Regular rate and rhythm, no murmurs, rubs, gallops RESPIRATORY:  Clear to auscultation without rales, wheezing or rhonchi  ABDOMEN: Soft, non-tender, non-distended EXTREMITIES:  No edema; No deformity    Wt Readings from Last 3 Encounters:  03/20/23 65.5 kg  02/01/23 66 kg  12/29/22 65.8 kg     EKG today demonstrates  SR Vent. rate 69 BPM PR interval 140 ms QRS duration 80 ms QT/QTcB 376/402 ms  Echo 08/25/20 demonstrated   1. Left ventricular ejection fraction, by estimation, is 60 to 65%. The  left ventricle has normal function. The left ventricle has no regional  wall motion abnormalities. Left ventricular diastolic parameters are  consistent with Grade II  diastolic dysfunction (pseudonormalization). Elevated left ventricular end-diastolic pressure.   2. Right ventricular systolic function is normal. The right ventricular  size is normal. There is normal pulmonary artery systolic pressure. The  estimated right ventricular systolic pressure is 33.7 mmHg.   3. Left atrial size was mildly dilated.   4. The mitral valve is normal in structure. Trivial mitral valve  regurgitation. No evidence of mitral stenosis.   5. The aortic valve is tricuspid. Aortic valve regurgitation is not  visualized. Mild aortic valve sclerosis is present, with no evidence of  aortic valve stenosis.   6. The inferior vena cava is normal in size with greater than 50%  respiratory variability, suggesting right atrial pressure of 3 mmHg.    CHA2DS2-VASc Score = 3  The patient's score is based upon: CHF History: 0 HTN History: 0 Diabetes History: 0 Stroke History: 0 Vascular Disease History: 0 Age Score: 2 Gender Score: 1         ASSESSMENT AND PLAN: Paroxysmal Atrial Fibrillation/atrial flutter The patient's CHA2DS2-VASc score is 3, indicating a 3.2% annual risk of stroke.   S/p CTI ablation 07/11/22 ILR shows overall low burden but she is symptomatic and rapid during the episodes. We discussed rhythm control options including resuming Toprol, AAD, or afib ablation. She would prefer to continue her present therapy for now. Continue Lopressor 25 mg PRN q 12 hours for heart racing Continue Xarelto 20 mg daily  Secondary Hypercoagulable State (ICD10:  D68.69) The patient is at significant risk for stroke/thromboembolism based upon her CHA2DS2-VASc Score of 3.  Continue Apixaban (Eliquis).     Follow up in the AF clinic in 6 months.       Jorja Loa PA-C Afib Clinic [facility] [address] [place], [place] [address] [phone]

## 2023-03-23 ENCOUNTER — Ambulatory Visit (INDEPENDENT_AMBULATORY_CARE_PROVIDER_SITE_OTHER): Payer: Medicare HMO

## 2023-03-23 DIAGNOSIS — R55 Syncope and collapse: Secondary | ICD-10-CM

## 2023-03-23 LAB — CUP PACEART REMOTE DEVICE CHECK
Date Time Interrogation Session: 20241121021938
Implantable Pulse Generator Implant Date: 20240718
Pulse Gen Serial Number: 511035918

## 2023-04-18 NOTE — Progress Notes (Signed)
[facility] Summary Report / Loop Recorder 

## 2023-04-18 NOTE — Addendum Note (Signed)
Addended by: Elease Etienne A on: 04/18/2023 01:22 PM   Modules accepted: Orders

## 2023-04-21 ENCOUNTER — Encounter: Payer: Self-pay | Admitting: Cardiology

## 2023-04-24 ENCOUNTER — Ambulatory Visit: Payer: Medicare HMO

## 2023-04-24 DIAGNOSIS — I483 Typical atrial flutter: Secondary | ICD-10-CM

## 2023-04-25 ENCOUNTER — Encounter: Payer: Self-pay | Admitting: Cardiology

## 2023-04-25 LAB — CUP PACEART REMOTE DEVICE CHECK
Date Time Interrogation Session: 20241223080102
Implantable Pulse Generator Implant Date: 20240718
Pulse Gen Serial Number: 511035918

## 2023-04-28 ENCOUNTER — Encounter: Payer: Self-pay | Admitting: Cardiology

## 2023-05-04 ENCOUNTER — Encounter: Payer: Self-pay | Admitting: Cardiology

## 2023-05-08 ENCOUNTER — Encounter: Payer: Self-pay | Admitting: Cardiology

## 2023-05-17 ENCOUNTER — Encounter: Payer: Self-pay | Admitting: Cardiology

## 2023-05-18 DIAGNOSIS — H524 Presbyopia: Secondary | ICD-10-CM | POA: Diagnosis not present

## 2023-05-24 ENCOUNTER — Encounter: Payer: Self-pay | Admitting: Cardiology

## 2023-05-25 ENCOUNTER — Ambulatory Visit (INDEPENDENT_AMBULATORY_CARE_PROVIDER_SITE_OTHER): Payer: Medicare HMO

## 2023-05-25 ENCOUNTER — Encounter: Payer: Self-pay | Admitting: Cardiology

## 2023-05-25 DIAGNOSIS — R55 Syncope and collapse: Secondary | ICD-10-CM

## 2023-05-25 LAB — CUP PACEART REMOTE DEVICE CHECK
Date Time Interrogation Session: 20250123080025
Implantable Pulse Generator Implant Date: 20240718
Pulse Gen Serial Number: 511035918

## 2023-06-01 NOTE — Progress Notes (Signed)
Merlin Loop [employer]

## 2023-06-01 NOTE — Addendum Note (Signed)
Addended by: Geralyn Flash D on: 06/01/2023 11:35 AM   Modules accepted: Orders

## 2023-06-03 ENCOUNTER — Encounter: Payer: Self-pay | Admitting: Cardiology

## 2023-06-04 ENCOUNTER — Encounter: Payer: Self-pay | Admitting: Cardiology

## 2023-06-05 ENCOUNTER — Telehealth: Payer: Self-pay | Admitting: *Deleted

## 2023-06-05 NOTE — Telephone Encounter (Signed)
Called patient to follow up about MyChart message received from her yesterday on 06/04/23. Patient stated that her SOB resolved shortly after activating her symptom button and thanked this RN for doing a wellness check on her.

## 2023-06-09 ENCOUNTER — Encounter: Payer: Self-pay | Admitting: Cardiology

## 2023-06-09 NOTE — Telephone Encounter (Signed)
 ILR alert for symptom + detection Event occurred 2/7 @ 03:05, fast HR, EGM c/w AF with RVR, presenting the same.  Route to triage Hx of PAF, Xarelto  LA, CVRS   Pt sent mychart message at time of transmission. Woke up around 3:00 AM .bp148/100.  HR 120. Took Metoprolol     By 3:15.  BP 138/93. hR 73 Five min later BP 147/79. hR 67. As usual when it's over I feel fine. Like nothing happened...  Asked pt to send a manual transmission for review and assessment of symptoms. After receiving transmission ill forward to MD.

## 2023-06-09 NOTE — Telephone Encounter (Signed)
 Follow up transmission received. NSR w/ PAC's. Asymptomatic. Forwarded to MD for advisement.

## 2023-06-20 ENCOUNTER — Encounter: Payer: Self-pay | Admitting: Cardiology

## 2023-06-22 ENCOUNTER — Ambulatory Visit (HOSPITAL_COMMUNITY): Payer: Medicare HMO | Admitting: Physician Assistant

## 2023-06-26 ENCOUNTER — Ambulatory Visit (INDEPENDENT_AMBULATORY_CARE_PROVIDER_SITE_OTHER): Payer: Medicare HMO

## 2023-06-26 DIAGNOSIS — E890 Postprocedural hypothyroidism: Secondary | ICD-10-CM | POA: Diagnosis not present

## 2023-06-26 DIAGNOSIS — E559 Vitamin D deficiency, unspecified: Secondary | ICD-10-CM | POA: Diagnosis not present

## 2023-06-26 DIAGNOSIS — R5383 Other fatigue: Secondary | ICD-10-CM | POA: Diagnosis not present

## 2023-06-26 DIAGNOSIS — I119 Hypertensive heart disease without heart failure: Secondary | ICD-10-CM | POA: Diagnosis not present

## 2023-06-26 DIAGNOSIS — I483 Typical atrial flutter: Secondary | ICD-10-CM

## 2023-06-26 DIAGNOSIS — E785 Hyperlipidemia, unspecified: Secondary | ICD-10-CM | POA: Diagnosis not present

## 2023-06-26 DIAGNOSIS — R739 Hyperglycemia, unspecified: Secondary | ICD-10-CM | POA: Diagnosis not present

## 2023-06-26 DIAGNOSIS — Z0189 Encounter for other specified special examinations: Secondary | ICD-10-CM | POA: Diagnosis not present

## 2023-06-26 DIAGNOSIS — E039 Hypothyroidism, unspecified: Secondary | ICD-10-CM | POA: Diagnosis not present

## 2023-06-26 LAB — CUP PACEART REMOTE DEVICE CHECK
Date Time Interrogation Session: 20250224080028
Implantable Pulse Generator Implant Date: 20240718
Pulse Gen Serial Number: 511035918

## 2023-06-27 ENCOUNTER — Encounter: Payer: Self-pay | Admitting: Cardiology

## 2023-06-28 ENCOUNTER — Encounter: Payer: Self-pay | Admitting: Cardiology

## 2023-06-30 DIAGNOSIS — I10 Essential (primary) hypertension: Secondary | ICD-10-CM | POA: Diagnosis not present

## 2023-06-30 DIAGNOSIS — E785 Hyperlipidemia, unspecified: Secondary | ICD-10-CM | POA: Diagnosis not present

## 2023-06-30 DIAGNOSIS — E039 Hypothyroidism, unspecified: Secondary | ICD-10-CM | POA: Diagnosis not present

## 2023-07-03 DIAGNOSIS — I5189 Other ill-defined heart diseases: Secondary | ICD-10-CM | POA: Diagnosis not present

## 2023-07-03 DIAGNOSIS — I700 Atherosclerosis of aorta: Secondary | ICD-10-CM | POA: Diagnosis not present

## 2023-07-03 DIAGNOSIS — I483 Typical atrial flutter: Secondary | ICD-10-CM | POA: Diagnosis not present

## 2023-07-03 DIAGNOSIS — E039 Hypothyroidism, unspecified: Secondary | ICD-10-CM | POA: Diagnosis not present

## 2023-07-03 DIAGNOSIS — E785 Hyperlipidemia, unspecified: Secondary | ICD-10-CM | POA: Diagnosis not present

## 2023-07-03 DIAGNOSIS — E890 Postprocedural hypothyroidism: Secondary | ICD-10-CM | POA: Diagnosis not present

## 2023-07-03 DIAGNOSIS — Z0000 Encounter for general adult medical examination without abnormal findings: Secondary | ICD-10-CM | POA: Diagnosis not present

## 2023-07-03 DIAGNOSIS — D6869 Other thrombophilia: Secondary | ICD-10-CM | POA: Diagnosis not present

## 2023-07-03 DIAGNOSIS — I10 Essential (primary) hypertension: Secondary | ICD-10-CM | POA: Diagnosis not present

## 2023-07-06 ENCOUNTER — Ambulatory Visit (HOSPITAL_COMMUNITY)
Admission: RE | Admit: 2023-07-06 | Discharge: 2023-07-06 | Disposition: A | Discharge status: Home / Self Care | Payer: Medicare HMO | Source: Ambulatory Visit | Attending: Physician Assistant | Admitting: Physician Assistant

## 2023-07-06 ENCOUNTER — Encounter (HOSPITAL_COMMUNITY): Payer: Self-pay | Admitting: Physician Assistant

## 2023-07-06 VITALS — BP 138/70 | HR 55 | Ht 63.0 in | Wt 144.2 lb

## 2023-07-06 DIAGNOSIS — I4891 Unspecified atrial fibrillation: Secondary | ICD-10-CM | POA: Diagnosis present

## 2023-07-06 DIAGNOSIS — I480 Paroxysmal atrial fibrillation: Secondary | ICD-10-CM | POA: Diagnosis not present

## 2023-07-06 DIAGNOSIS — Z7901 Long term (current) use of anticoagulants: Secondary | ICD-10-CM | POA: Insufficient documentation

## 2023-07-06 DIAGNOSIS — I4892 Unspecified atrial flutter: Secondary | ICD-10-CM | POA: Insufficient documentation

## 2023-07-06 DIAGNOSIS — D6869 Other thrombophilia: Secondary | ICD-10-CM | POA: Diagnosis not present

## 2023-07-06 DIAGNOSIS — E039 Hypothyroidism, unspecified: Secondary | ICD-10-CM | POA: Insufficient documentation

## 2023-07-06 NOTE — Progress Notes (Signed)
 Carelink Summary Report / Loop Recorder

## 2023-07-06 NOTE — Progress Notes (Signed)
 Primary Care Physician: Creola Corn, MD Primary Cardiologist: Meriam Sprague, MD (Inactive) Electrophysiologist: Lanier Prude, MD  Referring Physician: Device clinic    Tammy [name] is a 83 y.o. female with a history of hypothyroidism, atrial flutter, atrial fibrillation who presents for follow up in the [facility].  The patient was initially diagnosed with atrial flutter and underwent CTI ablation with Dr Lalla Brothers 07/11/22. An ILR was placed to monitor for afib. She did have a 2 hour afib episode on 01/25/23. Patient is on Xarelto for stroke prevention.   Patient returns for follow up for atrial fibrillation. Her ILR shows <1% afib burden. She did have a couple of afib episodes in February but none since. No bleeding issues on anticoagulation.   Today, he denies symptoms of chest pain, shortness of breath, orthopnea, PND, lower extremity edema, dizziness, presyncope, syncope, snoring, daytime somnolence, bleeding, or neurologic sequela. The patient is tolerating medications without difficulties and is otherwise without complaint today.    Atrial Fibrillation Risk Factors:  she does not have symptoms or diagnosis of sleep apnea. she does not have a history of rheumatic fever.   Atrial Fibrillation Management history:  Previous antiarrhythmic drugs: none Previous cardioversions: none Previous ablations: 07/11/22 Anticoagulation history: Xarelto  ROS- All systems are reviewed and negative except as per the HPI above.  Past Medical History:  Diagnosis Date   Hypothyroidism    Skin lesion of neck    Anterior Neck (Thyroid Tissue)   Thyroid disease     Current Outpatient Medications  Medication Sig Dispense Refill   ascorbic acid (VITAMIN C) 500 MG tablet Take 500 mg by mouth as needed (feel like getting a cold).     Cholecalciferol (VITAMIN D3) 2000 units TABS Take 2,000 Units by mouth daily.     EPINEPHrine 0.3 mg/0.3 mL IJ SOAJ injection  Inject 0.3 mg into the muscle daily as needed (for anaphylatic reactions (BEE STINGS)). For bee stings     Magnesium Citrate POWD Take 1 Scoop by mouth at bedtime. 325 mg     metoprolol tartrate (LOPRESSOR) 25 MG tablet Take 1 tablet (25 mg total) by mouth as needed. Take 1 tablet (25 mg total) as needed for palpitations and elevated heart rate. May take up to two tablets per day. 180 tablet 3   Multiple Vitamin (MULTIVITAMIN WITH MINERALS) TABS tablet Take 1 tablet by mouth daily.     Multiple Vitamins-Minerals (PRESERVISION AREDS 2 PO) Take 1 tablet by mouth daily.     olopatadine (PATADAY) 0.1 % ophthalmic solution Place 1 drop into the left eye daily as needed for allergies.     rivaroxaban (XARELTO) 20 MG TABS tablet Take 1 tablet (20 mg total) by mouth daily with supper. 30 tablet 6   SYNTHROID 100 MCG tablet Take 100 mcg by mouth daily before breakfast.     No current facility-administered medications for this encounter.    Physical Exam: BP 138/70   Pulse (!) 55   Ht 5\' 3"  (1.6 m)   Wt 65.4 kg   BMI 25.54 kg/m   GEN: Well nourished, well developed in no acute distress CARDIAC: Regular rate and rhythm, no murmurs, rubs, gallops RESPIRATORY:  Clear to auscultation without rales, wheezing or rhonchi  ABDOMEN: Soft, non-tender, non-distended EXTREMITIES:  No edema; No deformity    Wt Readings from Last 3 Encounters:  07/06/23 65.4 kg  03/20/23 65.5 kg  02/01/23 66 kg     EKG today  demonstrates  SB Vent. rate 55 BPM PR interval 144 ms QRS duration 86 ms QT/QTcB 428/409 ms   Echo 02/08/23 demonstrated   1. Left ventricular ejection fraction, by estimation, is 55 to 60%. The  left ventricle has normal function. The left ventricle has no regional  wall motion abnormalities. Left ventricular diastolic parameters are  consistent with Grade I diastolic dysfunction (impaired relaxation).   2. Right ventricular systolic function is normal. The right ventricular  size is  normal. There is mildly elevated pulmonary artery systolic  pressure.   3. Left atrial size was severely dilated.   4. The mitral valve is normal in structure. Mild mitral valve  regurgitation. No evidence of mitral stenosis.   5. The aortic valve is tricuspid. Aortic valve regurgitation is not  visualized. No aortic stenosis is present.   6. The inferior vena cava is normal in size with greater than 50%  respiratory variability, suggesting right atrial pressure of 3 mmHg.    CHA2DS2-VASc Score = 3  The patient's score is based upon: CHF History: 0 HTN History: 0 Diabetes History: 0 Stroke History: 0 Vascular Disease History: 0 Age Score: 2 Gender Score: 1       ASSESSMENT AND PLAN: Paroxysmal Atrial Fibrillation/atrial flutter The patient's CHA2DS2-VASc score is 3, indicating a 3.2% annual risk of stroke.   S/p CTI ablation 07/11/22 ILR shows AF burden <1% Patient happy with her present rhythm control. Could consider Multaq, dofetilide, amiodarone, or ablation if needed.  Continue Lopressor 25 mg PRN BID for heart racing. Continue Xarelto 20 mg daily  Secondary Hypercoagulable State (ICD10:  D68.69) The patient is at significant risk for stroke/thromboembolism based upon her CHA2DS2-VASc Score of 3.  Continue Rivaroxaban (Xarelto). No bleeding issues.     Follow up in the AF clinic in 6 months.       Jorja Loa PA-C Afib Clinic [facility] [address] [place], [place] [address] [phone]

## 2023-07-09 ENCOUNTER — Encounter: Payer: Self-pay | Admitting: Cardiology

## 2023-07-27 ENCOUNTER — Ambulatory Visit (INDEPENDENT_AMBULATORY_CARE_PROVIDER_SITE_OTHER): Payer: Medicare HMO

## 2023-07-27 DIAGNOSIS — R55 Syncope and collapse: Secondary | ICD-10-CM

## 2023-07-27 DIAGNOSIS — H18413 Arcus senilis, bilateral: Secondary | ICD-10-CM | POA: Diagnosis not present

## 2023-07-27 DIAGNOSIS — H40013 Open angle with borderline findings, low risk, bilateral: Secondary | ICD-10-CM | POA: Diagnosis not present

## 2023-07-27 DIAGNOSIS — H26491 Other secondary cataract, right eye: Secondary | ICD-10-CM | POA: Diagnosis not present

## 2023-07-27 DIAGNOSIS — H353131 Nonexudative age-related macular degeneration, bilateral, early dry stage: Secondary | ICD-10-CM | POA: Diagnosis not present

## 2023-07-27 DIAGNOSIS — H26493 Other secondary cataract, bilateral: Secondary | ICD-10-CM | POA: Diagnosis not present

## 2023-07-27 LAB — CUP PACEART REMOTE DEVICE CHECK
Date Time Interrogation Session: 20250327080029
Implantable Pulse Generator Implant Date: 20240718
Pulse Gen Model: 5000
Pulse Gen Serial Number: 511035918

## 2023-07-29 ENCOUNTER — Encounter: Payer: Self-pay | Admitting: Cardiology

## 2023-08-01 DIAGNOSIS — E039 Hypothyroidism, unspecified: Secondary | ICD-10-CM | POA: Diagnosis not present

## 2023-08-02 NOTE — Progress Notes (Signed)
 Merlin Loop [employer]

## 2023-08-03 DIAGNOSIS — H2511 Age-related nuclear cataract, right eye: Secondary | ICD-10-CM | POA: Diagnosis not present

## 2023-08-03 DIAGNOSIS — Z961 Presence of intraocular lens: Secondary | ICD-10-CM | POA: Diagnosis not present

## 2023-08-03 DIAGNOSIS — Z9841 Cataract extraction status, right eye: Secondary | ICD-10-CM | POA: Diagnosis not present

## 2023-08-11 DIAGNOSIS — H26492 Other secondary cataract, left eye: Secondary | ICD-10-CM | POA: Diagnosis not present

## 2023-08-23 DIAGNOSIS — D2372 Other benign neoplasm of skin of left lower limb, including hip: Secondary | ICD-10-CM | POA: Diagnosis not present

## 2023-08-23 DIAGNOSIS — Z85828 Personal history of other malignant neoplasm of skin: Secondary | ICD-10-CM | POA: Diagnosis not present

## 2023-08-23 DIAGNOSIS — Q825 Congenital non-neoplastic nevus: Secondary | ICD-10-CM | POA: Diagnosis not present

## 2023-08-28 ENCOUNTER — Ambulatory Visit (INDEPENDENT_AMBULATORY_CARE_PROVIDER_SITE_OTHER): Payer: Medicare HMO

## 2023-08-28 ENCOUNTER — Encounter: Payer: Self-pay | Admitting: Cardiology

## 2023-08-28 DIAGNOSIS — I483 Typical atrial flutter: Secondary | ICD-10-CM

## 2023-08-28 LAB — CUP PACEART REMOTE DEVICE CHECK
Date Time Interrogation Session: 20250428080130
Implantable Pulse Generator Implant Date: 20240718
Pulse Gen Model: 5000
Pulse Gen Serial Number: 511035918

## 2023-09-06 NOTE — Progress Notes (Signed)
 Carelink Summary Report / Loop Recorder

## 2023-09-13 ENCOUNTER — Ambulatory Visit (HOSPITAL_COMMUNITY): Payer: Medicare HMO | Admitting: Physician Assistant

## 2023-09-25 ENCOUNTER — Encounter: Payer: Self-pay | Admitting: Cardiology

## 2023-09-28 ENCOUNTER — Ambulatory Visit: Payer: Medicare HMO

## 2023-09-28 DIAGNOSIS — R55 Syncope and collapse: Secondary | ICD-10-CM

## 2023-09-28 LAB — CUP PACEART REMOTE DEVICE CHECK
Date Time Interrogation Session: 20250529050040
Implantable Pulse Generator Implant Date: 20240718
Pulse Gen Model: 5000
Pulse Gen Serial Number: 511035918

## 2023-09-29 ENCOUNTER — Encounter: Payer: Self-pay | Admitting: Cardiology

## 2023-09-29 NOTE — Telephone Encounter (Signed)
 Per patient's 5/26 my chart message:   "I just wanted to ask again if there is a chance I will be able to stop the blood thinner.? I believe my tiredness and shortness of breath when I try to talk a walk are side effects of that. The only other med I take is synthroid  and I have taken that for years and years. I don't believe I have had a afib eposode in a couple of months. I don't plan to ever have another steroid shot. So I'm just checking!!".   09/28/23 routine transmission: No new events, no AF noted.  2 symptom events recorded for SOB.          Aaron Aas

## 2023-10-02 ENCOUNTER — Ambulatory Visit: Payer: Self-pay | Admitting: Cardiology

## 2023-10-17 NOTE — Progress Notes (Signed)
 Merlin Loop [employer]

## 2023-10-21 ENCOUNTER — Encounter: Payer: Self-pay | Admitting: Cardiology

## 2023-10-30 ENCOUNTER — Ambulatory Visit: Payer: Medicare HMO

## 2023-10-30 DIAGNOSIS — I483 Typical atrial flutter: Secondary | ICD-10-CM | POA: Diagnosis not present

## 2023-10-30 DIAGNOSIS — R55 Syncope and collapse: Secondary | ICD-10-CM

## 2023-10-30 LAB — CUP PACEART REMOTE DEVICE CHECK
Date Time Interrogation Session: 20250630050525
Implantable Pulse Generator Implant Date: 20240718
Pulse Gen Model: 5000
Pulse Gen Serial Number: 511035918

## 2023-11-01 ENCOUNTER — Ambulatory Visit: Payer: Self-pay | Admitting: Cardiology

## 2023-11-08 ENCOUNTER — Encounter: Payer: Self-pay | Admitting: Cardiology

## 2023-11-08 ENCOUNTER — Telehealth: Payer: Self-pay

## 2023-11-08 NOTE — Telephone Encounter (Signed)
 Transmission does not show any Afib during symptom events. However, there were noted occasional premature beats PAC/PVC's with very brief pause following in 2 of the events with heart rates in the 40's, may have contributed to some of her symptoms.   Made patient aware.   States she did take a Metoprolol  (only takes PRN) when she feels she needs it.   States at times her heart rates have been in the 40's when checking it with her monitor.  She has had no further episodes today and feels fine.  Ongoing concern with having leg weakness and SOB when walking up hills but says this is not new and she has made her doctor's aware of it.   FYI to Dr. Cindie.  Patient knows to continue current treatment plan and call us  as she did today and send sx transmission if any further concerns.      July 9th, 3:46 am event:    11/08/23:  3:38am

## 2023-11-08 NOTE — Telephone Encounter (Signed)
 LM for patient to call back and we can talk her through sending a transmission.   We did not receive anything over night, last transmission was 10/30/23.

## 2023-11-08 NOTE — Telephone Encounter (Signed)
 Pt called in and she has sent that transmission

## 2023-11-08 NOTE — Telephone Encounter (Signed)
 See my chart encounter for discussion around transmission results from today.

## 2023-11-17 NOTE — Progress Notes (Signed)
 Carelink Summary Report / Loop Recorder

## 2023-11-27 ENCOUNTER — Other Ambulatory Visit (HOSPITAL_COMMUNITY): Payer: Self-pay | Admitting: Physician Assistant

## 2023-11-30 ENCOUNTER — Ambulatory Visit (INDEPENDENT_AMBULATORY_CARE_PROVIDER_SITE_OTHER): Payer: Medicare HMO

## 2023-11-30 DIAGNOSIS — I480 Paroxysmal atrial fibrillation: Secondary | ICD-10-CM

## 2023-11-30 LAB — CUP PACEART REMOTE DEVICE CHECK
Date Time Interrogation Session: 20250731040207
Implantable Pulse Generator Implant Date: 20240718
Pulse Gen Model: 5000
Pulse Gen Serial Number: 511035918

## 2023-11-30 NOTE — Progress Notes (Signed)
 Merlin Loop [employer]

## 2023-12-04 ENCOUNTER — Ambulatory Visit: Payer: Self-pay | Admitting: Cardiology

## 2023-12-07 ENCOUNTER — Encounter: Payer: Self-pay | Admitting: Cardiology

## 2023-12-07 NOTE — Telephone Encounter (Signed)
 Alert remote transmission:  AF/tachy episode detected with symptom 1 symtpom activation c/w AF with RVR 8/7 @ 01:25 1 tachy event at the same time, 37sec in duration, HR 171-204 AF in progress from 8/7 @ 01:22, poor rate control  Patient highly symptomatic during event with accompanying HTN.  Forwarding to AF clinic and cc: to Dr. Cindie.  Patient aware will receive a call following up.

## 2023-12-18 ENCOUNTER — Encounter: Payer: Self-pay | Admitting: Cardiology

## 2023-12-19 NOTE — Telephone Encounter (Signed)
 Alert remote transmission: Symptom 1 symptom event.  AF with RVR.  On OAC per EMR. To triage.  Presenting EGM with AF. V-rates ~ 150 bpm.    Alert remote transmission: Symptom 1 symptom event.  AF with RVR with conversion to SR.  Symptom alert prior to this one.     Alert remote transmission: Symptom 1 symptom event.  [place] of PAF and PACs.  Symptom alert prior to this one   Patient sends mychart this morning making us  aware of sx activations and that she had been sick this weekend.  She felt the afib with fast heart rates but did feel like something was different.  Events correlate with AF/RVR and very fast rates during episodes, also noted some ectopy occurring as well.   Patient reports feeling better today and thinks the prn Metoprolol  is enough for her.  FYI to Dr. Cindie and Connell - RN.

## 2023-12-20 NOTE — Telephone Encounter (Signed)
 ILR multiple sx activations: 8/19 1058pm - 1145pm.  EGM's consistent with SR/SB with PAC's, brief run of AF. Frequent noted PAC's.   Sent patient a my chart message to assess symptoms further and suggest patient come in for eval due to ongoing symptomatic irregular heart rhythm since the weekend.

## 2023-12-20 NOTE — Telephone Encounter (Signed)
 Spoke with the patient and advised on recommendations from Dr. Cindie. Patient has an appointment on 9/4.

## 2024-01-02 ENCOUNTER — Encounter: Payer: Self-pay | Admitting: Cardiology

## 2024-01-02 ENCOUNTER — Ambulatory Visit (INDEPENDENT_AMBULATORY_CARE_PROVIDER_SITE_OTHER): Payer: Medicare HMO

## 2024-01-02 DIAGNOSIS — I480 Paroxysmal atrial fibrillation: Secondary | ICD-10-CM

## 2024-01-02 NOTE — Telephone Encounter (Signed)
 Alert remote transmission: 1 Symptom + detection episode. 1 Symptom episode on 01/02/24 at 12:33 am correlating with Tachy event on 01/02/24 at 12:30 am of 38 sec duration, EGM c/w atrial driven tachycardia with PACs vs atrial arrhythmia, V rates up to 210's. Hx PAF and on Xarelto  per Epic.  Alert remote transmission: 1 Symptom + detection episode. 1 Symptom episode on 01/02/24 at 12:26 am correlating with AF event, EGM c/w SB 50's to atrial driven tachycardia with PACs vs atrial arrhythmia, V rates up to 160's. Hx PAF and on Xarelto  per Epic  Symptom events triggered by patient feeling palpitations. Currently asymptomatic. Sees AF clinic on 01/04/24.

## 2024-01-03 LAB — CUP PACEART REMOTE DEVICE CHECK
Date Time Interrogation Session: 20250831054315
Implantable Pulse Generator Implant Date: 20240718
Pulse Gen Model: 5000
Pulse Gen Serial Number: 511035918

## 2024-01-04 ENCOUNTER — Ambulatory Visit (HOSPITAL_COMMUNITY)
Admission: RE | Admit: 2024-01-04 | Discharge: 2024-01-04 | Disposition: A | Discharge status: Home / Self Care | Source: Ambulatory Visit | Attending: Physician Assistant | Admitting: Physician Assistant

## 2024-01-04 VITALS — BP 132/64 | HR 52 | Ht 63.0 in | Wt 139.8 lb

## 2024-01-04 DIAGNOSIS — I480 Paroxysmal atrial fibrillation: Secondary | ICD-10-CM | POA: Diagnosis not present

## 2024-01-04 DIAGNOSIS — D6869 Other thrombophilia: Secondary | ICD-10-CM | POA: Diagnosis not present

## 2024-01-04 DIAGNOSIS — I4891 Unspecified atrial fibrillation: Secondary | ICD-10-CM

## 2024-01-04 NOTE — Progress Notes (Signed)
 Primary Care Physician: Onita Rush, MD Primary Cardiologist: Powell FORBES Sorrow, MD (Inactive) Electrophysiologist: OLE ONEIDA HOLTS, MD  Referring Physician: Device clinic    Tammy [name] is a 83 y.o. female with a history of hypothyroidism, atrial flutter, atrial fibrillation who presents for follow up in the [facility].  The patient was initially diagnosed with atrial flutter and underwent CTI ablation with Dr HOLTS 07/11/22. An ILR was placed to monitor for afib. She did have a 2 hour afib episode on 01/25/23. Patient is on Xarelto  for stroke prevention.   Patient returns for follow up for atrial fibrillation. She has had several brief episodes of rapid heart rates recently on her ILR. Some were atrial tachycardia and some were afib. She admits she was ill with an URI at the time. No bleeding issues on anticoagulation.   Today, she  denies symptoms of chest pain, shortness of breath, orthopnea, PND, lower extremity edema, dizziness, presyncope, syncope, snoring, daytime somnolence, bleeding, or neurologic sequela. The patient is tolerating medications without difficulties and is otherwise without complaint today.    Atrial Fibrillation Risk Factors:  she does not have symptoms or diagnosis of sleep apnea. she does not have a history of rheumatic fever.   Atrial Fibrillation Management history:  Previous antiarrhythmic drugs: none Previous cardioversions: none Previous ablations: 07/11/22 Anticoagulation history: Xarelto   ROS- All systems are reviewed and negative except as per the HPI above.  Past Medical History:  Diagnosis Date   Hypothyroidism    Skin lesion of neck    Anterior Neck (Thyroid  Tissue)   Thyroid  disease     Current Outpatient Medications  Medication Sig Dispense Refill   ascorbic acid (VITAMIN C) 500 MG tablet Take 500 mg by mouth as needed (feel like getting a cold).     Cholecalciferol (VITAMIN D3) 2000 units TABS  Take 2,000 Units by mouth daily.     EPINEPHrine  0.3 mg/0.3 mL IJ SOAJ injection Inject 0.3 mg into the muscle daily as needed (for anaphylatic reactions (BEE STINGS)). For bee stings     Magnesium Citrate POWD Take 1 Scoop by mouth at bedtime. 325 mg     metoprolol  tartrate (LOPRESSOR ) 25 MG tablet Take 1 tablet (25 mg total) by mouth as needed. Take 1 tablet (25 mg total) as needed for palpitations and elevated heart rate. May take up to two tablets per day. 180 tablet 3   Multiple Vitamin (MULTIVITAMIN WITH MINERALS) TABS tablet Take 1 tablet by mouth daily.     Multiple Vitamins-Minerals (PRESERVISION AREDS 2 PO) Take 1 tablet by mouth daily.     rivaroxaban  (XARELTO ) 20 MG TABS tablet TAKE 1 TABLET BY MOUTH ONCE DAILY WITH SUPPER 30 tablet 11   SYNTHROID  100 MCG tablet Take 100 mcg by mouth daily before breakfast.     olopatadine (PATADAY) 0.1 % ophthalmic solution Place 1 drop into the left eye daily as needed for allergies. (Patient not taking: Reported on 01/04/2024)     No current facility-administered medications for this encounter.    Physical Exam: BP 132/64   Pulse (!) 52   Ht 5' 3 (1.6 m)   Wt 63.4 kg   BMI 24.76 kg/m   GEN: Well nourished, well developed in no acute distress CARDIAC: Regular rate and rhythm, no murmurs, rubs, gallops RESPIRATORY:  Clear to auscultation without rales, wheezing or rhonchi  ABDOMEN: Soft, non-tender, non-distended EXTREMITIES:  No edema; No deformity    Wt Readings from Last 3  Encounters:  01/04/24 63.4 kg  07/06/23 65.4 kg  03/20/23 65.5 kg     EKG today demonstrates  SB Vent. rate 52 BPM PR interval 150 ms QRS duration 80 ms QT/QTcB 422/392 ms   Echo 02/08/23 demonstrated   1. Left ventricular ejection fraction, by estimation, is 55 to 60%. The  left ventricle has normal function. The left ventricle has no regional  wall motion abnormalities. Left ventricular diastolic parameters are  consistent with Grade I diastolic  dysfunction (impaired relaxation).   2. Right ventricular systolic function is normal. The right ventricular  size is normal. There is mildly elevated pulmonary artery systolic  pressure.   3. Left atrial size was severely dilated.   4. The mitral valve is normal in structure. Mild mitral valve  regurgitation. No evidence of mitral stenosis.   5. The aortic valve is tricuspid. Aortic valve regurgitation is not  visualized. No aortic stenosis is present.   6. The inferior vena cava is normal in size with greater than 50%  respiratory variability, suggesting right atrial pressure of 3 mmHg.    CHA2DS2-VASc Score = 3  The patient's score is based upon: CHF History: 0 HTN History: 0 Diabetes History: 0 Stroke History: 0 Vascular Disease History: 0 Age Score: 2 Gender Score: 1       ASSESSMENT AND PLAN: Paroxysmal Atrial Fibrillation/atrial flutter (ICD10:  I48.0) The patient's CHA2DS2-VASc score is 3, indicating a 3.2% annual risk of stroke.   S/p CTI ablation 07/11/22 ILR has shown recent episodes of brief but rapid afib. Also episodes of possible atrial tachycardia.  We discussed rhythm control options including Multaq, dofetilide, amiodarone, or ablation. She would like to continue her present therapy for now and only consider further treatment if her episodes are more persistent. Baseline bradycardia limits medications.  Continue Lopressor  25 mg PRN BID for heart racing. Continue Xarelto  20 mg daily  Secondary Hypercoagulable State (ICD10:  D68.69) The patient is at significant risk for stroke/thromboembolism based upon her CHA2DS2-VASc Score of 3.  Continue Rivaroxaban  (Xarelto ). No bleeding issues.    Follow up in the AF clinic in 6 months.       Daril Kicks PA-C Afib Clinic [facility] [address] [place], [place] [address] [phone]

## 2024-01-06 ENCOUNTER — Ambulatory Visit: Payer: Self-pay | Admitting: Cardiology

## 2024-01-09 DIAGNOSIS — E039 Hypothyroidism, unspecified: Secondary | ICD-10-CM | POA: Diagnosis not present

## 2024-01-09 DIAGNOSIS — I10 Essential (primary) hypertension: Secondary | ICD-10-CM | POA: Diagnosis not present

## 2024-01-09 DIAGNOSIS — E871 Hypo-osmolality and hyponatremia: Secondary | ICD-10-CM | POA: Diagnosis not present

## 2024-01-09 DIAGNOSIS — E785 Hyperlipidemia, unspecified: Secondary | ICD-10-CM | POA: Diagnosis not present

## 2024-01-09 DIAGNOSIS — E559 Vitamin D deficiency, unspecified: Secondary | ICD-10-CM | POA: Diagnosis not present

## 2024-01-09 DIAGNOSIS — R5383 Other fatigue: Secondary | ICD-10-CM | POA: Diagnosis not present

## 2024-01-09 DIAGNOSIS — D6869 Other thrombophilia: Secondary | ICD-10-CM | POA: Diagnosis not present

## 2024-01-09 DIAGNOSIS — C73 Malignant neoplasm of thyroid gland: Secondary | ICD-10-CM | POA: Diagnosis not present

## 2024-01-09 DIAGNOSIS — R739 Hyperglycemia, unspecified: Secondary | ICD-10-CM | POA: Diagnosis not present

## 2024-01-09 DIAGNOSIS — I483 Typical atrial flutter: Secondary | ICD-10-CM | POA: Diagnosis not present

## 2024-01-09 NOTE — Progress Notes (Signed)
 Remote Loop Recorder Transmission

## 2024-01-17 DIAGNOSIS — H3563 Retinal hemorrhage, bilateral: Secondary | ICD-10-CM | POA: Diagnosis not present

## 2024-01-18 DIAGNOSIS — H353221 Exudative age-related macular degeneration, left eye, with active choroidal neovascularization: Secondary | ICD-10-CM | POA: Diagnosis not present

## 2024-01-18 DIAGNOSIS — H3562 Retinal hemorrhage, left eye: Secondary | ICD-10-CM | POA: Diagnosis not present

## 2024-01-30 NOTE — Progress Notes (Signed)
 Remote Loop Recorder Transmission

## 2024-02-01 ENCOUNTER — Ambulatory Visit: Payer: Medicare HMO

## 2024-02-01 DIAGNOSIS — I4891 Unspecified atrial fibrillation: Secondary | ICD-10-CM | POA: Diagnosis not present

## 2024-02-01 LAB — CUP PACEART REMOTE DEVICE CHECK
Date Time Interrogation Session: 20251002060332
Implantable Pulse Generator Implant Date: 20240718
Pulse Gen Model: 5000
Pulse Gen Serial Number: 511035918

## 2024-02-02 ENCOUNTER — Ambulatory Visit: Payer: Self-pay | Admitting: Cardiology

## 2024-02-02 NOTE — Progress Notes (Signed)
 Remote Loop Recorder Transmission

## 2024-02-07 ENCOUNTER — Telehealth: Payer: Self-pay

## 2024-02-07 NOTE — Telephone Encounter (Signed)
 Repeat Alert remote transmission:  Tachy Events 10/7 @ 23:17 & 23:18, HR's 153-185, AF with RVR, Xarelto  per EPIC Presenting now SR, burden 54%, histogram peak in AF 130-140 Route to triage for HVR's and previous two alerts for symptoms + detection of AF with RVR  Known hx of AF/RVR and tachy rates.  Is being followed by Afib clinic to discuss rate control options if events become more problematic for patient as she has chosen to hold on tx options for now.   Will send my chart message to patient to inquire as to symptoms and cc: AF clinic with new events.

## 2024-02-19 DIAGNOSIS — H3562 Retinal hemorrhage, left eye: Secondary | ICD-10-CM | POA: Diagnosis not present

## 2024-02-19 DIAGNOSIS — H353112 Nonexudative age-related macular degeneration, right eye, intermediate dry stage: Secondary | ICD-10-CM | POA: Diagnosis not present

## 2024-02-19 DIAGNOSIS — H353221 Exudative age-related macular degeneration, left eye, with active choroidal neovascularization: Secondary | ICD-10-CM | POA: Diagnosis not present

## 2024-02-19 DIAGNOSIS — H353122 Nonexudative age-related macular degeneration, left eye, intermediate dry stage: Secondary | ICD-10-CM | POA: Diagnosis not present

## 2024-03-04 ENCOUNTER — Ambulatory Visit

## 2024-03-04 DIAGNOSIS — I4891 Unspecified atrial fibrillation: Secondary | ICD-10-CM | POA: Diagnosis not present

## 2024-03-04 LAB — CUP PACEART REMOTE DEVICE CHECK
Date Time Interrogation Session: 20251103054059
Implantable Pulse Generator Implant Date: 20240718
Pulse Gen Model: 5000
Pulse Gen Serial Number: 511035918

## 2024-03-05 ENCOUNTER — Ambulatory Visit: Payer: Self-pay | Admitting: Cardiology

## 2024-03-21 DIAGNOSIS — H353221 Exudative age-related macular degeneration, left eye, with active choroidal neovascularization: Secondary | ICD-10-CM | POA: Diagnosis not present

## 2024-03-21 DIAGNOSIS — H353112 Nonexudative age-related macular degeneration, right eye, intermediate dry stage: Secondary | ICD-10-CM | POA: Diagnosis not present

## 2024-03-21 DIAGNOSIS — H3562 Retinal hemorrhage, left eye: Secondary | ICD-10-CM | POA: Diagnosis not present

## 2024-03-21 DIAGNOSIS — H353122 Nonexudative age-related macular degeneration, left eye, intermediate dry stage: Secondary | ICD-10-CM | POA: Diagnosis not present

## 2024-03-25 ENCOUNTER — Telehealth: Payer: Self-pay | Admitting: *Deleted

## 2024-03-25 NOTE — Telephone Encounter (Unsigned)
 Alert remote transmission: Repeat Symptom 1 symptom activation, no associated arrhythmia, SR with occasional PVC Previously routed for pt reassurance/re-education, pt has historically declined rate control medication, however continues to send symptom for fast HR. Follow up as scheduled. [place], CVRS _____________________________________________________________________  Attempted outreach to patient to provide reassurance  No answer  Left detailed message stating the transmission showed SR with an occasional PVC and that if patient had any further questions that she could call the clinic (number provided).

## 2024-03-26 NOTE — Telephone Encounter (Signed)
 Patient called back into clinic with additional questions and concerns which were all addressed during the call to the satisfaction of the patient and they stated, I feel better now.   Patient reassurance provided and patient extremely appreciative

## 2024-04-04 ENCOUNTER — Ambulatory Visit: Attending: Cardiology

## 2024-04-04 DIAGNOSIS — I4891 Unspecified atrial fibrillation: Secondary | ICD-10-CM | POA: Diagnosis not present

## 2024-04-06 LAB — CUP PACEART REMOTE DEVICE CHECK
Date Time Interrogation Session: 20251204054219
Implantable Pulse Generator Implant Date: 20240718
Pulse Gen Model: 5000
Pulse Gen Serial Number: 511035918

## 2024-04-10 NOTE — Progress Notes (Signed)
 Remote Loop Recorder Transmission

## 2024-04-19 ENCOUNTER — Ambulatory Visit: Payer: Self-pay | Admitting: Cardiology

## 2024-05-06 ENCOUNTER — Ambulatory Visit

## 2024-05-06 DIAGNOSIS — I4891 Unspecified atrial fibrillation: Secondary | ICD-10-CM

## 2024-05-06 LAB — CUP PACEART REMOTE DEVICE CHECK
Date Time Interrogation Session: 20260104044914
Implantable Pulse Generator Implant Date: 20240718
Pulse Gen Model: 5000
Pulse Gen Serial Number: 511035918

## 2024-05-07 ENCOUNTER — Encounter: Payer: Self-pay | Admitting: Cardiology

## 2024-05-09 NOTE — Progress Notes (Signed)
 Remote Loop Recorder Transmission

## 2024-05-11 ENCOUNTER — Ambulatory Visit: Payer: Self-pay | Admitting: Cardiology

## 2024-06-05 ENCOUNTER — Telehealth: Payer: Self-pay

## 2024-06-05 NOTE — Progress Notes (Signed)
 error

## 2024-06-05 NOTE — Telephone Encounter (Signed)
 Alert remote transmission: Symptom + detection Event occurred 2/4 @03 :18, EGM c/w AF with RVR, HR's 170's-180's, Xarelto  per [facility] - route to triage 3 tachy events 2/4 03:16-03:19, 3-12sec in duration, HR's 150's-210

## 2024-06-05 NOTE — Telephone Encounter (Signed)
 See mychart message.  Pt states she feels fine now after taking metoprolol .

## 2024-06-06 ENCOUNTER — Ambulatory Visit

## 2024-06-06 LAB — CUP PACEART REMOTE DEVICE CHECK
Date Time Interrogation Session: 20260205050500
Implantable Pulse Generator Implant Date: 20240718
Pulse Gen Model: 5000
Pulse Gen Serial Number: 511035918

## 2024-07-03 ENCOUNTER — Ambulatory Visit (HOSPITAL_COMMUNITY): Admitting: Physician Assistant

## 2024-07-07 ENCOUNTER — Ambulatory Visit

## 2024-08-07 ENCOUNTER — Ambulatory Visit

## 2024-09-07 ENCOUNTER — Ambulatory Visit

## 2024-10-08 ENCOUNTER — Ambulatory Visit

## 2024-11-08 ENCOUNTER — Ambulatory Visit

## 2024-12-09 ENCOUNTER — Ambulatory Visit

## 2025-01-09 ENCOUNTER — Ambulatory Visit

## 2025-02-09 ENCOUNTER — Ambulatory Visit

## 2025-03-12 ENCOUNTER — Ambulatory Visit
